# Patient Record
Sex: Male | Born: 1955 | Race: White | Hispanic: No | Marital: Married | Smoking: Never smoker
Health system: Southern US, Community
[De-identification: ages and names within clinical notes are randomized; demographics above are authoritative.]

## PROBLEM LIST (undated history)

## (undated) DIAGNOSIS — I10 Essential (primary) hypertension: Secondary | ICD-10-CM

## (undated) DIAGNOSIS — G510 Bell's palsy: Secondary | ICD-10-CM

## (undated) DIAGNOSIS — I4891 Unspecified atrial fibrillation: Secondary | ICD-10-CM

## (undated) DIAGNOSIS — I359 Nonrheumatic aortic valve disorder, unspecified: Secondary | ICD-10-CM

## (undated) DIAGNOSIS — E785 Hyperlipidemia, unspecified: Secondary | ICD-10-CM

## (undated) DIAGNOSIS — C6290 Malignant neoplasm of unspecified testis, unspecified whether descended or undescended: Secondary | ICD-10-CM

## (undated) DIAGNOSIS — I5021 Acute systolic (congestive) heart failure: Secondary | ICD-10-CM

## (undated) DIAGNOSIS — I499 Cardiac arrhythmia, unspecified: Secondary | ICD-10-CM

## (undated) DIAGNOSIS — M1711 Unilateral primary osteoarthritis, right knee: Secondary | ICD-10-CM

## (undated) DIAGNOSIS — R0601 Orthopnea: Secondary | ICD-10-CM

## (undated) DIAGNOSIS — K649 Unspecified hemorrhoids: Secondary | ICD-10-CM

## (undated) DIAGNOSIS — R011 Cardiac murmur, unspecified: Secondary | ICD-10-CM

## (undated) DIAGNOSIS — R0602 Shortness of breath: Secondary | ICD-10-CM

## (undated) HISTORY — PX: CARDIAC CATHETERIZATION: SHX172

## (undated) HISTORY — PX: CARDIAC VALVE REPLACEMENT: SHX585

---

## 1986-02-07 HISTORY — PX: KNEE ARTHROSCOPY W/ MENISCAL REPAIR: SHX1877

## 1991-02-08 HISTORY — PX: ORCHIECTOMY: SHX2116

## 1992-02-08 DIAGNOSIS — C6290 Malignant neoplasm of unspecified testis, unspecified whether descended or undescended: Secondary | ICD-10-CM

## 1992-02-08 HISTORY — DX: Malignant neoplasm of unspecified testis, unspecified whether descended or undescended: C62.90

## 1997-08-02 ENCOUNTER — Emergency Department (HOSPITAL_COMMUNITY): Admission: EM | Admit: 1997-08-02 | Discharge: 1997-08-02 | Payer: Self-pay | Admitting: Emergency Medicine

## 2009-09-07 DIAGNOSIS — G510 Bell's palsy: Secondary | ICD-10-CM

## 2009-09-07 HISTORY — DX: Bell's palsy: G51.0

## 2012-07-11 ENCOUNTER — Other Ambulatory Visit: Payer: Self-pay | Admitting: Interventional Cardiology

## 2012-07-13 ENCOUNTER — Encounter (HOSPITAL_COMMUNITY): Payer: Self-pay | Admitting: Anesthesiology

## 2012-07-13 ENCOUNTER — Ambulatory Visit (HOSPITAL_COMMUNITY)
Admission: RE | Admit: 2012-07-13 | Discharge: 2012-07-13 | Disposition: A | Discharge status: Home / Self Care | Payer: BC Managed Care – PPO | Source: Ambulatory Visit | Attending: Interventional Cardiology | Admitting: Interventional Cardiology

## 2012-07-13 ENCOUNTER — Encounter (HOSPITAL_COMMUNITY)
Admission: RE | Disposition: A | Discharge status: Home / Self Care | Payer: Self-pay | Source: Ambulatory Visit | Attending: Interventional Cardiology

## 2012-07-13 ENCOUNTER — Ambulatory Visit (HOSPITAL_COMMUNITY): Payer: BC Managed Care – PPO | Admitting: Anesthesiology

## 2012-07-13 ENCOUNTER — Encounter (HOSPITAL_COMMUNITY): Payer: Self-pay | Admitting: *Deleted

## 2012-07-13 DIAGNOSIS — I080 Rheumatic disorders of both mitral and aortic valves: Secondary | ICD-10-CM | POA: Insufficient documentation

## 2012-07-13 DIAGNOSIS — Z7901 Long term (current) use of anticoagulants: Secondary | ICD-10-CM | POA: Insufficient documentation

## 2012-07-13 DIAGNOSIS — I4891 Unspecified atrial fibrillation: Secondary | ICD-10-CM | POA: Insufficient documentation

## 2012-07-13 DIAGNOSIS — I359 Nonrheumatic aortic valve disorder, unspecified: Secondary | ICD-10-CM

## 2012-07-13 DIAGNOSIS — E8881 Metabolic syndrome: Secondary | ICD-10-CM | POA: Insufficient documentation

## 2012-07-13 DIAGNOSIS — Z79899 Other long term (current) drug therapy: Secondary | ICD-10-CM | POA: Insufficient documentation

## 2012-07-13 DIAGNOSIS — I10 Essential (primary) hypertension: Secondary | ICD-10-CM | POA: Insufficient documentation

## 2012-07-13 DIAGNOSIS — I079 Rheumatic tricuspid valve disease, unspecified: Secondary | ICD-10-CM | POA: Insufficient documentation

## 2012-07-13 DIAGNOSIS — E785 Hyperlipidemia, unspecified: Secondary | ICD-10-CM | POA: Insufficient documentation

## 2012-07-13 DIAGNOSIS — I517 Cardiomegaly: Secondary | ICD-10-CM | POA: Insufficient documentation

## 2012-07-13 DIAGNOSIS — Z8547 Personal history of malignant neoplasm of testis: Secondary | ICD-10-CM | POA: Insufficient documentation

## 2012-07-13 DIAGNOSIS — Q231 Congenital insufficiency of aortic valve: Secondary | ICD-10-CM | POA: Insufficient documentation

## 2012-07-13 DIAGNOSIS — Z9079 Acquired absence of other genital organ(s): Secondary | ICD-10-CM | POA: Insufficient documentation

## 2012-07-13 DIAGNOSIS — Z8249 Family history of ischemic heart disease and other diseases of the circulatory system: Secondary | ICD-10-CM | POA: Insufficient documentation

## 2012-07-13 HISTORY — DX: Bell's palsy: G51.0

## 2012-07-13 HISTORY — PX: CARDIOVERSION: SHX1299

## 2012-07-13 HISTORY — DX: Cardiac murmur, unspecified: R01.1

## 2012-07-13 HISTORY — DX: Hyperlipidemia, unspecified: E78.5

## 2012-07-13 HISTORY — DX: Unspecified atrial fibrillation: I48.91

## 2012-07-13 HISTORY — DX: Nonrheumatic aortic valve disorder, unspecified: I35.9

## 2012-07-13 HISTORY — DX: Unspecified hemorrhoids: K64.9

## 2012-07-13 HISTORY — PX: TEE WITHOUT CARDIOVERSION: SHX5443

## 2012-07-13 HISTORY — DX: Unilateral primary osteoarthritis, right knee: M17.11

## 2012-07-13 HISTORY — DX: Cardiac arrhythmia, unspecified: I49.9

## 2012-07-13 HISTORY — DX: Essential (primary) hypertension: I10

## 2012-07-13 SURGERY — ECHOCARDIOGRAM, TRANSESOPHAGEAL
Anesthesia: General

## 2012-07-13 MED ORDER — PROPOFOL 10 MG/ML IV BOLUS
INTRAVENOUS | Status: DC | PRN
  Administered 2012-07-13: 45 mg via INTRAVENOUS

## 2012-07-13 MED ORDER — FUROSEMIDE 40 MG PO TABS: 40.0000 mg | ORAL_TABLET | Freq: Two times a day (BID) | ORAL | Status: DC

## 2012-07-13 MED ORDER — AMIODARONE HCL 200 MG PO TABS: 400.0000 mg | ORAL_TABLET | Freq: Every day | ORAL | Status: DC

## 2012-07-13 MED ORDER — AMIODARONE HCL 400 MG PO TABS: 400.0000 mg | ORAL_TABLET | Freq: Every day | ORAL | Status: DC

## 2012-07-13 MED ORDER — LIDOCAINE VISCOUS 2 % MT SOLN
OROMUCOSAL | Status: DC | PRN
  Administered 2012-07-13: 10 mL via OROMUCOSAL

## 2012-07-13 MED ORDER — DILTIAZEM HCL ER BEADS 180 MG PO CP24: 360.0000 mg | ORAL_CAPSULE | Freq: Every day | ORAL | Status: DC

## 2012-07-13 MED ORDER — LIDOCAINE VISCOUS 2 % MT SOLN
OROMUCOSAL | Status: AC
  Filled 2012-07-13: qty 15

## 2012-07-13 MED ORDER — MIDAZOLAM HCL 10 MG/2ML IJ SOLN
INTRAMUSCULAR | Status: DC | PRN
  Administered 2012-07-13: 1 mg via INTRAVENOUS
  Administered 2012-07-13: 2 mg via INTRAVENOUS
  Administered 2012-07-13: 1 mg via INTRAVENOUS
  Administered 2012-07-13: 2 mg via INTRAVENOUS

## 2012-07-13 MED ORDER — MIDAZOLAM HCL 5 MG/ML IJ SOLN
INTRAMUSCULAR | Status: AC
  Filled 2012-07-13: qty 2

## 2012-07-13 MED ORDER — FENTANYL CITRATE 0.05 MG/ML IJ SOLN
INTRAMUSCULAR | Status: DC | PRN
  Administered 2012-07-13 (×2): 25 ug via INTRAVENOUS

## 2012-07-13 MED ORDER — SODIUM CHLORIDE 0.9 % IV SOLN
INTRAVENOUS | Status: DC | PRN
  Administered 2012-07-13: 16:00:00 via INTRAVENOUS

## 2012-07-13 MED ORDER — SODIUM CHLORIDE 0.9 % IV SOLN
INTRAVENOUS | Status: DC
Start: 2012-07-13 — End: 2012-07-13
  Administered 2012-07-13: 14:00:00 via INTRAVENOUS

## 2012-07-13 MED ORDER — FENTANYL CITRATE 0.05 MG/ML IJ SOLN
INTRAMUSCULAR | Status: AC
  Filled 2012-07-13: qty 4

## 2012-07-13 NOTE — Progress Notes (Signed)
  Echocardiogram Echocardiogram Transesophageal has been performed.  Georgian Co 07/13/2012, 4:03 PM

## 2012-07-13 NOTE — Transfer of Care (Signed)
Immediate Anesthesia Transfer of Care Note  Patient: Reginald Myers  Procedure(s) Performed: Procedure(s): TRANSESOPHAGEAL ECHOCARDIOGRAM (TEE) (N/A) CARDIOVERSION (N/A)  Patient Location: PACU and Endoscopy Unit  Anesthesia Type:General  Level of Consciousness: sedated and patient cooperative  Airway & Oxygen Therapy: Patient Spontanous Breathing and Patient connected to nasal cannula oxygen  Post-op Assessment: Report given to PACU RN and Post -op Vital signs reviewed and stable  Post vital signs: Reviewed and stable  Complications: No apparent anesthesia complications

## 2012-07-13 NOTE — Anesthesia Preprocedure Evaluation (Signed)
Anesthesia Evaluation  Patient identified by MRN, date of birth, ID band Patient awake    Reviewed: Allergy & Precautions, H&P , NPO status , Patient's Chart, lab work & pertinent test results  Airway  TM Distance: >3 FB Neck ROM: full    Dental   Pulmonary          Cardiovascular hypertension, + dysrhythmias Atrial Fibrillation Rhythm:irregular Rate:Normal     Neuro/Psych    GI/Hepatic   Endo/Other    Renal/GU      Musculoskeletal   Abdominal   Peds  Hematology   Anesthesia Other Findings   Reproductive/Obstetrics                           Anesthesia Physical Anesthesia Plan  ASA: III  Anesthesia Plan: General   Post-op Pain Management:    Induction: Intravenous  Airway Management Planned: Mask  Additional Equipment:   Intra-op Plan:   Post-operative Plan:   Informed Consent: I have reviewed the patients History and Physical, chart, labs and discussed the procedure including the risks, benefits and alternatives for the proposed anesthesia with the patient or authorized representative who has indicated his/her understanding and acceptance.     Plan Discussed with: CRNA, Anesthesiologist and Surgeon  Anesthesia Plan Comments:         Anesthesia Quick Evaluation

## 2012-07-13 NOTE — H&P (Signed)
  Date of Initial H&P: 07/06/12  History reviewed, patient examined, no change in status, stable for TEE/CV for AFib with RVR.  WIll evaluate the aortic valve as well.

## 2012-07-13 NOTE — Preoperative (Signed)
Beta Blockers   Reason not to administer Beta Blockers:Not Applicable 

## 2012-07-13 NOTE — Anesthesia Postprocedure Evaluation (Signed)
  Anesthesia Post-op Note  Patient: Reginald Myers  Procedure(s) Performed: Procedure(s): TRANSESOPHAGEAL ECHOCARDIOGRAM (TEE) (N/A) CARDIOVERSION (N/A)  Patient Location: PACU and Endoscopy Unit  Anesthesia Type:General  Level of Consciousness: awake, oriented and patient cooperative  Airway and Oxygen Therapy: Patient Spontanous Breathing  Post-op Pain: none  Post-op Assessment: Post-op Vital signs reviewed, Patient's Cardiovascular Status Stable, Respiratory Function Stable, Patent Airway, No signs of Nausea or vomiting and Pain level controlled  Post-op Vital Signs: stable  Complications: No apparent anesthesia complications

## 2012-07-13 NOTE — CV Procedure (Signed)
Electrical Cardioversion Procedure Note Reginald Myers [id number] 1955/11/06  Procedure: Electrical Cardioversion Indications:  Atrial Fibrillation  Time Out: Verified patient identification, verified procedure,medications/allergies/relevent history reviewed, required imaging and test results available.  Performed  Procedure Details  The patient was NPO after midnight. Anesthesia was administered at the beside  by Dr. Katrinka Blazing with 45 mg of propofol.  Cardioversion was done with synchronized biphasic defibrillation with AP pads with 120J, 150J, 200J.  The patient converted to normal sinus rhythm each time but quickly reverted to AFib with RVR. The patient tolerated the procedure well.  IMPRESSION:  Cardioversion attempt of atrial fibrillation with restoration of NSR, that lasted only 45 seconds, before recurrence of AFib.    Reginald Myers S. 07/13/2012, 4:05 PM

## 2012-07-15 ENCOUNTER — Encounter (HOSPITAL_COMMUNITY): Payer: Self-pay | Admitting: Interventional Cardiology

## 2012-08-06 ENCOUNTER — Other Ambulatory Visit: Payer: Self-pay | Admitting: Interventional Cardiology

## 2012-08-08 ENCOUNTER — Ambulatory Visit (HOSPITAL_COMMUNITY)
Admission: RE | Admit: 2012-08-08 | Discharge: 2012-08-08 | Disposition: A | Discharge status: Home / Self Care | Payer: BC Managed Care – PPO | Source: Ambulatory Visit | Attending: Interventional Cardiology | Admitting: Interventional Cardiology

## 2012-08-08 ENCOUNTER — Encounter (HOSPITAL_COMMUNITY)
Admission: RE | Disposition: A | Discharge status: Home / Self Care | Payer: Self-pay | Source: Ambulatory Visit | Attending: Interventional Cardiology

## 2012-08-08 ENCOUNTER — Encounter (HOSPITAL_COMMUNITY): Payer: Self-pay

## 2012-08-08 ENCOUNTER — Ambulatory Visit (HOSPITAL_COMMUNITY): Payer: BC Managed Care – PPO | Admitting: Anesthesiology

## 2012-08-08 ENCOUNTER — Encounter (HOSPITAL_COMMUNITY): Payer: Self-pay | Admitting: Anesthesiology

## 2012-08-08 DIAGNOSIS — E785 Hyperlipidemia, unspecified: Secondary | ICD-10-CM | POA: Insufficient documentation

## 2012-08-08 DIAGNOSIS — Z7901 Long term (current) use of anticoagulants: Secondary | ICD-10-CM | POA: Insufficient documentation

## 2012-08-08 DIAGNOSIS — E8881 Metabolic syndrome: Secondary | ICD-10-CM | POA: Insufficient documentation

## 2012-08-08 DIAGNOSIS — I10 Essential (primary) hypertension: Secondary | ICD-10-CM | POA: Insufficient documentation

## 2012-08-08 DIAGNOSIS — I4891 Unspecified atrial fibrillation: Secondary | ICD-10-CM | POA: Insufficient documentation

## 2012-08-08 DIAGNOSIS — R011 Cardiac murmur, unspecified: Secondary | ICD-10-CM | POA: Insufficient documentation

## 2012-08-08 DIAGNOSIS — I359 Nonrheumatic aortic valve disorder, unspecified: Secondary | ICD-10-CM | POA: Insufficient documentation

## 2012-08-08 DIAGNOSIS — Z79899 Other long term (current) drug therapy: Secondary | ICD-10-CM | POA: Insufficient documentation

## 2012-08-08 HISTORY — PX: CARDIOVERSION: SHX1299

## 2012-08-08 SURGERY — CARDIOVERSION
Anesthesia: General

## 2012-08-08 MED ORDER — PROPOFOL 10 MG/ML IV BOLUS
INTRAVENOUS | Status: DC | PRN
  Administered 2012-08-08: 50 mg via INTRAVENOUS
  Administered 2012-08-08: 20 mg via INTRAVENOUS
  Administered 2012-08-08: 50 mg via INTRAVENOUS

## 2012-08-08 MED ORDER — LIDOCAINE HCL (CARDIAC) 20 MG/ML IV SOLN
INTRAVENOUS | Status: DC | PRN
  Administered 2012-08-08: 20 mg via INTRAVENOUS

## 2012-08-08 NOTE — H&P (Signed)
Date of Initial H&P: 07/19/12  History reviewed, patient examined, no change in status, stable for cardioversion.

## 2012-08-08 NOTE — Transfer of Care (Signed)
Immediate Anesthesia Transfer of Care Note  Patient: Reginald Myers  Procedure(s) Performed: Procedure(s): CARDIOVERSION (N/A)  Patient Location: Endoscopy Unit  Anesthesia Type:General  Level of Consciousness: awake, alert  and oriented  Airway & Oxygen Therapy: Patient Spontanous Breathing and Patient connected to nasal cannula oxygen  Post-op Assessment: Report given to PACU RN and Post -op Vital signs reviewed and stable  Post vital signs: Reviewed and stable  Complications: No apparent anesthesia complications

## 2012-08-08 NOTE — Anesthesia Postprocedure Evaluation (Signed)
  Anesthesia Post-op Note  Patient: Reginald Myers  Procedure(s) Performed: Procedure(s): CARDIOVERSION (N/A)  Patient Location: Endoscopy Unit  Anesthesia Type:General  Level of Consciousness: awake, alert  and oriented  Airway and Oxygen Therapy: Patient Spontanous Breathing and Patient connected to nasal cannula oxygen  Post-op Pain: none  Post-op Assessment: Post-op Vital signs reviewed, Patient's Cardiovascular Status Stable, Respiratory Function Stable, Patent Airway and No signs of Nausea or vomiting  Post-op Vital Signs: Reviewed and stable  Complications: No apparent anesthesia complications

## 2012-08-08 NOTE — CV Procedure (Addendum)
Electrical Cardioversion Procedure Note Author Hatlestad [id number] November 22, 1955  Procedure: Electrical Cardioversion Indications:  Atrial Fibrillation  Time Out: Verified patient identification, verified procedure,medications/allergies/relevent history reviewed, required imaging and test results available.  Performed  Procedure Details  The patient was NPO after midnight. Anesthesia was administered at the beside  by Dr.Jackson with 120mg  of propofol.  Cardioversion was done with synchronized biphasic defibrillation with AP pads up to 200J.  The patient did not convert to normal sinus rhythm. The patient tolerated the procedure well   IMPRESSION:  Unsuccessful cardioversion. Patient remains in atrial fibrillation.  Will have to consider referral for AVR with Maze procedure.    Reginald Myers S. 08/08/2012, 12:41 PM

## 2012-08-08 NOTE — Anesthesia Preprocedure Evaluation (Addendum)
Anesthesia Evaluation  Patient identified by MRN, date of birth, ID band Patient awake    Reviewed: Allergy & Precautions, H&P , NPO status , Patient's Chart, lab work & pertinent test results  History of Anesthesia Complications Negative for: history of anesthetic complications  Airway Mallampati: I TM Distance: >3 FB Neck ROM: Full    Dental  (+) Teeth Intact and Dental Advisory Given   Pulmonary neg pulmonary ROS,  breath sounds clear to auscultation  Pulmonary exam normal       Cardiovascular hypertension, Pt. on medications + dysrhythmias (xarelto) Atrial Fibrillation + Valvular Problems/Murmurs AI Rhythm:Irregular Rate:Normal  ECHO: EF 33%, Aortic insufficiency   Neuro/Psych negative neurological ROS     GI/Hepatic negative GI ROS, Neg liver ROS,   Endo/Other  negative endocrine ROS  Renal/GU negative Renal ROS   H/o testicular cancer    Musculoskeletal   Abdominal   Peds  Hematology   Anesthesia Other Findings   Reproductive/Obstetrics                         Anesthesia Physical Anesthesia Plan  ASA: III  Anesthesia Plan: General   Post-op Pain Management:    Induction: Intravenous  Airway Management Planned: Mask  Additional Equipment:   Intra-op Plan:   Post-operative Plan:   Informed Consent: I have reviewed the patients History and Physical, chart, labs and discussed the procedure including the risks, benefits and alternatives for the proposed anesthesia with the patient or authorized representative who has indicated his/her understanding and acceptance.   Dental advisory given  Plan Discussed with: CRNA and Surgeon  Anesthesia Plan Comments:         Anesthesia Quick Evaluation

## 2012-08-09 ENCOUNTER — Encounter (HOSPITAL_COMMUNITY): Payer: Self-pay | Admitting: Interventional Cardiology

## 2012-08-13 ENCOUNTER — Inpatient Hospital Stay (HOSPITAL_COMMUNITY)
Admission: AD | Admit: 2012-08-13 | Discharge: 2012-08-16 | DRG: 543 | Disposition: A | Discharge status: Home / Self Care | Payer: BC Managed Care – PPO | Source: Ambulatory Visit | Attending: Interventional Cardiology | Admitting: Interventional Cardiology

## 2012-08-13 ENCOUNTER — Encounter (HOSPITAL_COMMUNITY): Payer: Self-pay | Admitting: General Practice

## 2012-08-13 ENCOUNTER — Other Ambulatory Visit: Payer: Self-pay | Admitting: Interventional Cardiology

## 2012-08-13 DIAGNOSIS — I2789 Other specified pulmonary heart diseases: Secondary | ICD-10-CM | POA: Diagnosis present

## 2012-08-13 DIAGNOSIS — I5021 Acute systolic (congestive) heart failure: Secondary | ICD-10-CM | POA: Diagnosis present

## 2012-08-13 DIAGNOSIS — I080 Rheumatic disorders of both mitral and aortic valves: Principal | ICD-10-CM | POA: Diagnosis present

## 2012-08-13 DIAGNOSIS — M1710 Unilateral primary osteoarthritis, unspecified knee: Secondary | ICD-10-CM | POA: Diagnosis present

## 2012-08-13 DIAGNOSIS — E785 Hyperlipidemia, unspecified: Secondary | ICD-10-CM | POA: Diagnosis present

## 2012-08-13 DIAGNOSIS — R0602 Shortness of breath: Secondary | ICD-10-CM | POA: Insufficient documentation

## 2012-08-13 DIAGNOSIS — I079 Rheumatic tricuspid valve disease, unspecified: Secondary | ICD-10-CM | POA: Diagnosis present

## 2012-08-13 DIAGNOSIS — I428 Other cardiomyopathies: Secondary | ICD-10-CM | POA: Diagnosis present

## 2012-08-13 DIAGNOSIS — I2510 Atherosclerotic heart disease of native coronary artery without angina pectoris: Secondary | ICD-10-CM | POA: Diagnosis present

## 2012-08-13 DIAGNOSIS — I509 Heart failure, unspecified: Secondary | ICD-10-CM | POA: Diagnosis present

## 2012-08-13 DIAGNOSIS — I359 Nonrheumatic aortic valve disorder, unspecified: Secondary | ICD-10-CM

## 2012-08-13 DIAGNOSIS — I4891 Unspecified atrial fibrillation: Secondary | ICD-10-CM | POA: Diagnosis present

## 2012-08-13 DIAGNOSIS — I10 Essential (primary) hypertension: Secondary | ICD-10-CM | POA: Diagnosis present

## 2012-08-13 HISTORY — DX: Malignant neoplasm of unspecified testis, unspecified whether descended or undescended: C62.90

## 2012-08-13 HISTORY — DX: Shortness of breath: R06.02

## 2012-08-13 HISTORY — DX: Orthopnea: R06.01

## 2012-08-13 HISTORY — DX: Acute systolic (congestive) heart failure: I50.21

## 2012-08-13 LAB — CBC WITH DIFFERENTIAL/PLATELET
Eosinophils Absolute: 0.2 10*3/uL (ref 0.0–0.7)
Eosinophils Relative: 3 % (ref 0–5)
HCT: 42.6 % (ref 39.0–52.0)
Lymphocytes Relative: 13 % (ref 12–46)
Lymphs Abs: 1.1 10*3/uL (ref 0.7–4.0)
MCH: 27.3 pg (ref 26.0–34.0)
MCV: 81.9 fL (ref 78.0–100.0)
Monocytes Absolute: 0.5 10*3/uL (ref 0.1–1.0)
Platelets: 204 10*3/uL (ref 150–400)
RBC: 5.2 MIL/uL (ref 4.22–5.81)
RDW: 15.9 % — ABNORMAL HIGH (ref 11.5–15.5)
WBC: 8.4 10*3/uL (ref 4.0–10.5)

## 2012-08-13 LAB — TSH: TSH: 3.003 u[IU]/mL (ref 0.350–4.500)

## 2012-08-13 LAB — COMPREHENSIVE METABOLIC PANEL
CO2: 24 mEq/L (ref 19–32)
Calcium: 9 mg/dL (ref 8.4–10.5)
Creatinine, Ser: 1.79 mg/dL — ABNORMAL HIGH (ref 0.50–1.35)
GFR calc Af Amer: 48 mL/min — ABNORMAL LOW (ref >90)
GFR calc non Af Amer: 41 mL/min — ABNORMAL LOW (ref >90)
Glucose, Bld: 129 mg/dL — ABNORMAL HIGH (ref 70–99)
Sodium: 137 mEq/L (ref 135–145)
Total Protein: 6.9 g/dL (ref 6.0–8.3)

## 2012-08-13 MED ORDER — FUROSEMIDE 10 MG/ML IJ SOLN
80.0000 mg | Freq: Two times a day (BID) | INTRAMUSCULAR | Status: DC
  Administered 2012-08-13 – 2012-08-14 (×2): 80 mg via INTRAVENOUS
  Filled 2012-08-13 (×4): qty 8

## 2012-08-13 MED ORDER — DILTIAZEM HCL ER 180 MG PO CP24
180.0000 mg | ORAL_CAPSULE | Freq: Two times a day (BID) | ORAL | Status: DC
  Administered 2012-08-14 – 2012-08-16 (×4): 180 mg via ORAL
  Filled 2012-08-13 (×7): qty 1

## 2012-08-13 MED ORDER — ONDANSETRON HCL 4 MG/2ML IJ SOLN: 4.0000 mg | Freq: Four times a day (QID) | INTRAMUSCULAR | Status: DC | PRN

## 2012-08-13 MED ORDER — SODIUM CHLORIDE 0.9 % IJ SOLN
3.0000 mL | Freq: Two times a day (BID) | INTRAMUSCULAR | Status: DC
  Administered 2012-08-13 – 2012-08-16 (×6): 3 mL via INTRAVENOUS

## 2012-08-13 MED ORDER — SODIUM CHLORIDE 0.9 % IJ SOLN: 3.0000 mL | INTRAMUSCULAR | Status: DC | PRN

## 2012-08-13 MED ORDER — HEPARIN SODIUM (PORCINE) 5000 UNIT/ML IJ SOLN
5000.0000 [IU] | Freq: Three times a day (TID) | INTRAMUSCULAR | Status: DC
  Administered 2012-08-13 – 2012-08-16 (×8): 5000 [IU] via SUBCUTANEOUS
  Filled 2012-08-13 (×12): qty 1

## 2012-08-13 MED ORDER — DIGOXIN 250 MCG PO TABS
0.2500 mg | ORAL_TABLET | Freq: Every day | ORAL | Status: AC
  Administered 2012-08-13 – 2012-08-14 (×2): 0.25 mg via ORAL
  Filled 2012-08-13 (×4): qty 1

## 2012-08-13 MED ORDER — SODIUM CHLORIDE 0.9 % IV SOLN: 250.0000 mL | INTRAVENOUS | Status: DC | PRN

## 2012-08-13 MED ORDER — ACETAMINOPHEN 325 MG PO TABS: 650.0000 mg | ORAL_TABLET | ORAL | Status: DC | PRN

## 2012-08-13 NOTE — H&P (Signed)
Admit date: 08/13/12 Referring Physician Dr. Kevan Ny Primary Cardiologist:Varanasi Chief complaint/reason for admission:Shortness of breath, fluid overload  HPI: 57 -year-old man who had moderate to severe aortic insufficiency diagnosed back in December of 2013, after his primary care physician heard a heart murmur. He had normal left ventricular function at that time. He followed up with me in about 5 months later. He had not been feeling well for about a month. He was tired and could not keep up with his usual exercise regimen. On exam, he was tachycardic at the time. He was found to be in atrial fibrillation with rapid ventricular response. Echocardiogram in May of 2014 showed significantly decreased left ventricular function. We attempted rate control but he continued to feel fatigued. We attempted cardioversion with the thought that his LV function would improve with a decrease in heart rate. Unfortunately, the cardioversion was unsuccessful as he would convert to normal rhythm but would not maintain sinus rhythm. We then started him on amiodarone in the hopes of her repeat cardioversion. At this point, it was obvious that he would need his aortic valve repair as this was likely the cause of his LV dysfunction. The goal is to have his LV function come back to normal and then send him for surgery. He did not tolerate amiodarone very well. He felt nauseated and his dose was lowered. He underwent repeat cardioversion and unfortunately, this failed to get him back into normal sinus rhythm. Since this attempt last week, he was started on metoprolol. He felt very poorly after the first dose of metoprolol. She has gained about 18 pounds over the course of the last few weeks. He is worsening swelling in his legs and orthopnea. He also has a cough.     PMH:    Past Medical History  Diagnosis Date  . Heart murmur   . Dysrhythmia   . 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 Hypertension   . Hyperlipidemia   . Right  knee DJD     prior meniscal surgery  . Bell's palsy 09/2009    right-residual weakness with fatique   . Hemorrhoids   . Right knee pain   . Atrial fibrillation   . Aortic valve disorders     PSH:    Past Surgical History  Procedure Laterality Date  . Orchiectomy Left 1993  . Knee surgery Right 1988    meniscal repair  . Tee without cardioversion N/A 07/13/2012    Procedure: TRANSESOPHAGEAL ECHOCARDIOGRAM (TEE);  Surgeon: Corky Crafts, MD;  Location: [facility] ENDOSCOPY;  Service: Cardiovascular;  Laterality: N/A;  . Cardioversion N/A 07/13/2012    Procedure: CARDIOVERSION;  Surgeon: Corky Crafts, MD;  Location: [facility] ENDOSCOPY;  Service: Cardiovascular;  Laterality: N/A;  . Cardioversion N/A 08/08/2012    Procedure: CARDIOVERSION;  Surgeon: Corky Crafts, MD;  Location: [facility] ENDOSCOPY;  Service: Cardiovascular;  Laterality: N/A;    ALLERGIES:   Review of patient's allergies indicates no known allergies.  Prior to Admit Meds:   (Not in a hospital admission) Family HX:   No family history on file. Social HX:    History   Social History  . Marital Status: Single    Spouse Name: N/A    Number of Children: N/A  . Years of Education: N/A   Occupational History  . Not on file.   Social History Main Topics  . Smoking status: Never Smoker   . Smokeless tobacco: Never Used  . Alcohol Use: Yes  Comment: glass of wine couple times a month  . Drug Use: No  . Sexually Active: Not on file   Other Topics Concern  . Not on file   Social History Narrative  . No narrative on file     ROS:  All 11 ROS were addressed and are negative except what is stated in the HPI  PHYSICAL EXAM There were no vitals filed for this visit. General: Well developed, well nourished, in no acute distress Head:   Normal cephalic and atramatic  Lungs: bibasilar crackles Heart:  Irregularly irregular Abdomen:  abdomen soft and non-tender Msk:   Normal strength and tone for age. Extremities:   Bilateral pitting edema. Neuro: Alert and oriented X 3. Psych:  Normal affect, responds appropriately   Labs:   No results found for this basename: WBC, HGB, HCT, MCV, PLT   No results found for this basename: NA, K, CL, CO2, BUN, CREATININE, CALCIUM, LABALBU, PROT, BILITOT, ALKPHOS, ALT, AST, GLUCOSE,  in the last 168 hours No results found for this basename: CKTOTAL, CKMB, CKMBINDEX, TROPONINI   No results found for this basename: PTT   No results found for this basename: INR, PROTIME     No results found for this basename: CHOL   No results found for this basename: HDL   No results found for this basename: LDLCALC   No results found for this basename: TRIG   No results found for this basename: CHOLHDL   No results found for this basename: LDLDIRECT      Radiology:  @[id number] @  EKG:  pending  ASSESSMENT: Aortic insufficiency, SHOB, edema, AFib  PLAN:  SOB Notes: I think is related to fluid overload. We discussed admitting him to the hospital. He is willing to do this. We'll give him IV Lasix. we'll have to watch his renal function closely as this has been mildly elevated. Aortic heart murmur Notes: significant aortic insufficiency, when he is stabilized, he will need aortic valve replacement. He also had some mitral regurgitation noted on TEE. It was difficult to quantitate due to the fact that he was in atrial fibrillation with rapid ventricular response.  He will need cath preop and surgical consultation while in the hospital. Atrial fibrillation Notes: Rate control with Cardizem. Will add digoxin for additional rate control. He did not tolerate amiodarone. Edema of legs Notes: Hopefully, this will improve with IV Lasix.    Corky Crafts., MD  08/13/2012  2:49 PM

## 2012-08-13 NOTE — Progress Notes (Signed)
Utilization Review Completed.   Daana Petrasek, RN, BSN Nurse Case Manager  [phone]  

## 2012-08-14 ENCOUNTER — Other Ambulatory Visit: Payer: Self-pay | Admitting: *Deleted

## 2012-08-14 DIAGNOSIS — I359 Nonrheumatic aortic valve disorder, unspecified: Secondary | ICD-10-CM

## 2012-08-14 LAB — BASIC METABOLIC PANEL
BUN: 30 mg/dL — ABNORMAL HIGH (ref 6–23)
Chloride: 102 mEq/L (ref 96–112)
GFR calc Af Amer: 46 mL/min — ABNORMAL LOW (ref >90)
GFR calc non Af Amer: 39 mL/min — ABNORMAL LOW (ref >90)
Potassium: 3.5 mEq/L (ref 3.5–5.1)
Sodium: 139 mEq/L (ref 135–145)

## 2012-08-14 MED ORDER — SODIUM CHLORIDE 0.9 % IJ SOLN
3.0000 mL | Freq: Two times a day (BID) | INTRAMUSCULAR | Status: DC
  Administered 2012-08-15: 3 mL via INTRAVENOUS

## 2012-08-14 MED ORDER — FUROSEMIDE 10 MG/ML IJ SOLN
40.0000 mg | Freq: Two times a day (BID) | INTRAMUSCULAR | Status: DC
  Administered 2012-08-14: 40 mg via INTRAVENOUS

## 2012-08-14 MED ORDER — POTASSIUM CHLORIDE CRYS ER 20 MEQ PO TBCR
40.0000 meq | EXTENDED_RELEASE_TABLET | Freq: Once | ORAL | Status: AC
  Administered 2012-08-14: 40 meq via ORAL
  Filled 2012-08-14: qty 2

## 2012-08-14 MED ORDER — SODIUM CHLORIDE 0.9 % IV SOLN
INTRAVENOUS | Status: DC
  Administered 2012-08-15: 20 mL/h via INTRAVENOUS

## 2012-08-14 MED ORDER — ASPIRIN 81 MG PO CHEW
324.0000 mg | CHEWABLE_TABLET | ORAL | Status: AC
  Administered 2012-08-15: 324 mg via ORAL
  Filled 2012-08-14: qty 4

## 2012-08-14 MED ORDER — DIAZEPAM 5 MG PO TABS
5.0000 mg | ORAL_TABLET | ORAL | Status: AC
  Administered 2012-08-15: 5 mg via ORAL
  Filled 2012-08-14: qty 1

## 2012-08-14 MED ORDER — SODIUM CHLORIDE 0.9 % IJ SOLN: 3.0000 mL | INTRAMUSCULAR | Status: DC | PRN

## 2012-08-14 MED ORDER — ACETAMINOPHEN 325 MG PO TABS: 650.0000 mg | ORAL_TABLET | ORAL | Status: DC | PRN

## 2012-08-14 MED ORDER — ONDANSETRON HCL 4 MG/2ML IJ SOLN: 4.0000 mg | Freq: Four times a day (QID) | INTRAMUSCULAR | Status: DC | PRN

## 2012-08-14 MED ORDER — SODIUM CHLORIDE 0.9 % IV SOLN: 250.0000 mL | INTRAVENOUS | Status: DC | PRN

## 2012-08-14 NOTE — Progress Notes (Signed)
  Echocardiogram 2D Echocardiogram has been performed.  Reginald Myers FRANCES 08/14/2012, 10:35 AM

## 2012-08-14 NOTE — Progress Notes (Signed)
SUBJECTIVE:   Feels better this morning.  He diuresed over 3 kg  In the past 18 hours.  He is able to lie flat.  OBJECTIVE:   Vitals:   Filed Vitals:   08/13/12 1821 08/13/12 2136 08/14/12 0117 08/14/12 0611  BP:  114/84 90/41 89/42   Pulse: 100 103 95 96  Temp:  97.2 F (36.2 C)  98 F (36.7 C)  TempSrc:  Axillary  Oral  Resp:  18 18 18   Height:      Weight:    97.977 kg (216 lb)  SpO2:  100% 98% 93%   I&O's:   Intake/Output Summary (Last 24 hours) at 08/14/12 1610 Last data filed at 08/14/12 0847  Gross per 24 hour  Intake    843 ml  Output   4000 ml  Net  -3157 ml   TELEMETRY: Reviewed telemetry pt in atrial fibrillation, borderline rate control:     PHYSICAL EXAM General: Well developed, well nourished, in no acute distress Head:    Normal cephalic and atramatic  Lungs:  Scant bibasilar crackles Heart:  Irregularly irregular, normal rate Abdomen:  abdomen soft and non-tender  Msk:  Back normal,Normal strength and tone for age. Extremities:  Bilateral pitting lower extremity edema.   Neuro: Alert and oriented X 3. Psych:  Normal affect, responds appropriately   LABS: Basic Metabolic Panel:  Recent Labs  96/04/54 1805 08/14/12 0400  NA 137 139  K 4.0 3.5  CL 99 102  CO2 24 26  GLUCOSE 129* 83  BUN 32* 30*  CREATININE 1.79* 1.86*  CALCIUM 9.0 8.8   Liver Function Tests:  Recent Labs  08/13/12 1805  AST 31  ALT 29  ALKPHOS 90  BILITOT 1.7*  PROT 6.9  ALBUMIN 3.4*   No results found for this basename: LIPASE, AMYLASE,  in the last 72 hours CBC:  Recent Labs  08/13/12 1805  WBC 8.4  NEUTROABS 6.6  HGB 14.2  HCT 42.6  MCV 81.9  PLT 204   Cardiac Enzymes: No results found for this basename: CKTOTAL, CKMB, CKMBINDEX, TROPONINI,  in the last 72 hours BNP: No components found with this basename: POCBNP,  D-Dimer: No results found for this basename: DDIMER,  in the last 72 hours Hemoglobin A1C: No results found for this basename:  HGBA1C,  in the last 72 hours Fasting Lipid Panel: No results found for this basename: CHOL, HDL, LDLCALC, TRIG, CHOLHDL, LDLDIRECT,  in the last 72 hours Thyroid Function Tests:  Recent Labs  08/13/12 1805  TSH 3.003   Anemia Panel: No results found for this basename: VITAMINB12, FOLATE, FERRITIN, TIBC, IRON, RETICCTPCT,  in the last 72 hours Coag Panel:   No results found for this basename: INR, PROTIME    RADIOLOGY: No results found.    ASSESSMENT: Severe aortic insufficiency, cardiomyopathy, atrial fibrillation, fluid overload  PLAN:  Continue diuresis today.  We'll decrease dose of IV Lasix to 40 mg IV twice a day.  We'll have to follow renal function given the aggressive diuresis.  Replace potassium today.  No ACE inhibitor given renal insufficiency.  Continue calcium channel blocker for rate control.  He is currently off of his anticoagulation in anticipation of cardiac cath tomorrow.  I have consulted cardiac surgery regarding aortic valve replacement.  After cath, would plan on resuming Xarelto.  Digoxin started as well for additional help with rate control.  He was intolerant of amiodarone and metoprolol. He has fail 2 cardioversions as well.  Corky Crafts., MD  08/14/2012  9:37 AM

## 2012-08-15 ENCOUNTER — Encounter (HOSPITAL_COMMUNITY)
Admission: AD | Disposition: A | Discharge status: Home / Self Care | Payer: Self-pay | Source: Ambulatory Visit | Attending: Interventional Cardiology

## 2012-08-15 ENCOUNTER — Inpatient Hospital Stay (HOSPITAL_COMMUNITY): Payer: BC Managed Care – PPO

## 2012-08-15 HISTORY — PX: LEFT AND RIGHT HEART CATHETERIZATION WITH CORONARY ANGIOGRAM: SHX5449

## 2012-08-15 LAB — BASIC METABOLIC PANEL
CO2: 27 mEq/L (ref 19–32)
Chloride: 103 mEq/L (ref 96–112)
Creatinine, Ser: 1.81 mg/dL — ABNORMAL HIGH (ref 0.50–1.35)
GFR calc Af Amer: 47 mL/min — ABNORMAL LOW (ref >90)
Potassium: 3.7 mEq/L (ref 3.5–5.1)
Sodium: 139 mEq/L (ref 135–145)

## 2012-08-15 LAB — CBC
HCT: 40.5 % (ref 39.0–52.0)
MCHC: 32.3 g/dL (ref 30.0–36.0)
MCV: 82.5 fL (ref 78.0–100.0)
Platelets: 193 10*3/uL (ref 150–400)
RDW: 15.9 % — ABNORMAL HIGH (ref 11.5–15.5)

## 2012-08-15 LAB — POCT I-STAT 3, ART BLOOD GAS (G3+)
Acid-Base Excess: 1 mmol/L (ref 0.0–2.0)
Acid-base deficit: 1 mmol/L (ref 0.0–2.0)
Bicarbonate: 24.3 mEq/L — ABNORMAL HIGH (ref 20.0–24.0)
O2 Saturation: 79 %
TCO2: 25 mmol/L (ref 0–100)

## 2012-08-15 LAB — POCT I-STAT 3, VENOUS BLOOD GAS (G3P V)
Acid-Base Excess: 1 mmol/L (ref 0.0–2.0)
pCO2, Ven: 39.5 mmHg — ABNORMAL LOW (ref 45.0–50.0)
pH, Ven: 7.421 — ABNORMAL HIGH (ref 7.250–7.300)
pO2, Ven: 31 mmHg (ref 30.0–45.0)

## 2012-08-15 LAB — POCT ACTIVATED CLOTTING TIME: Activated Clotting Time: 170 seconds

## 2012-08-15 SURGERY — LEFT AND RIGHT HEART CATHETERIZATION WITH CORONARY ANGIOGRAM
Anesthesia: Moderate Sedation

## 2012-08-15 SURGERY — LEFT AND RIGHT HEART CATHETERIZATION WITH CORONARY ANGIOGRAM
Anesthesia: LOCAL

## 2012-08-15 MED ORDER — HEPARIN (PORCINE) IN NACL 2-0.9 UNIT/ML-% IJ SOLN
INTRAMUSCULAR | Status: AC
  Filled 2012-08-15: qty 1000

## 2012-08-15 MED ORDER — MIDAZOLAM HCL 2 MG/2ML IJ SOLN
INTRAMUSCULAR | Status: AC
  Filled 2012-08-15: qty 2

## 2012-08-15 MED ORDER — ONDANSETRON HCL 4 MG/2ML IJ SOLN: 4.0000 mg | Freq: Four times a day (QID) | INTRAMUSCULAR | Status: DC | PRN

## 2012-08-15 MED ORDER — HEPARIN SODIUM (PORCINE) 5000 UNIT/ML IJ SOLN: 5000.0000 [IU] | Freq: Three times a day (TID) | INTRAMUSCULAR | Status: DC

## 2012-08-15 MED ORDER — FENTANYL CITRATE 0.05 MG/ML IJ SOLN
INTRAMUSCULAR | Status: AC
  Filled 2012-08-15: qty 2

## 2012-08-15 MED ORDER — ACETAMINOPHEN 325 MG PO TABS: 650.0000 mg | ORAL_TABLET | ORAL | Status: DC | PRN

## 2012-08-15 MED ORDER — MORPHINE SULFATE 2 MG/ML IJ SOLN: 1.0000 mg | INTRAMUSCULAR | Status: DC | PRN

## 2012-08-15 MED ORDER — NITROGLYCERIN 0.2 MG/ML ON CALL CATH LAB
INTRAVENOUS | Status: AC
  Filled 2012-08-15: qty 1

## 2012-08-15 MED ORDER — HEPARIN SODIUM (PORCINE) 1000 UNIT/ML IJ SOLN
INTRAMUSCULAR | Status: AC
  Filled 2012-08-15: qty 1

## 2012-08-15 MED ORDER — LIDOCAINE HCL (PF) 1 % IJ SOLN
INTRAMUSCULAR | Status: AC
  Filled 2012-08-15: qty 30

## 2012-08-15 MED ORDER — ALBUTEROL SULFATE (5 MG/ML) 0.5% IN NEBU
2.5000 mg | INHALATION_SOLUTION | Freq: Once | RESPIRATORY_TRACT | Status: AC
  Administered 2012-08-15: 2.5 mg via RESPIRATORY_TRACT

## 2012-08-15 MED ORDER — VERAPAMIL HCL 2.5 MG/ML IV SOLN
INTRAVENOUS | Status: AC
  Filled 2012-08-15: qty 2

## 2012-08-15 NOTE — CV Procedure (Addendum)
PROCEDURE:  Right and Left heart catheterization with selective coronary angiography, left ventriculogram.  INDICATIONS:  Aortic insufficiency  The risks, benefits, and details of the procedure were explained to the patient.  The patient verbalized understanding and wanted to proceed.  Informed written consent was obtained.  PROCEDURE TECHNIQUE:  After Xylocaine anesthesia a 63F sheath was placed in the right femoral artery with a single anterior needle wall stick.   Left coronary angiography was done using a Judkins L4 guide catheter.  Right coronary angiography was done using a Judkins R4 guide catheter.  Left ventriculography was done using a pigtail catheter.    CONTRAST:  Total of 60 cc.  COMPLICATIONS:  None.    HEMODYNAMICS:  Aortic pressure was 90/54; LV pressure was 122/12; LVEDP 21.  There was a moderate gradient between the left ventricle and aorta.  RA 9/8 mm Hg; RV 56/8 mm Hg; PA 56/22, mean PA 35 mm Hg; PCWP 22/21 mm Hg.  CO 4.6 L/min; CI 1.86.  Aortic saturation 98%; pulmonary artery saturation 61%.  ANGIOGRAPHIC DATA:   The left main coronary artery is long and patent.  The left anterior descending artery is a medium sized vessel which reaches the apex.  The first diagonal is a large vessel and is widely patent.  THere is mild disease in the LAD  The left circumflex artery is a large vessel proximally.  There is a large first OM and a large atrial branch.  The circumflex system is widely patent.  The right coronary artery is a large dominant vessel with minimal atherosclerosis.  There is a large PDA and a large PLA which are both widely patent.  LEFT VENTRICULOGRAM:  Left ventricular angiogram was done in the 30 RAO projection and revealed normal left ventricular wall motion and systolic function with an estimated ejection fraction of 35%.  LVEDP was 21 mmHg.  IMPRESSIONS:  1. Normal left main coronary artery. 2. Mild atherosclerosis in the left anterior descending artery  and its branches. 3. Normal left circumflex artery and its branches. 4. Mild atherosclerosis in the right coronary artery system. 5. Decreased left ventricular systolic function.  LVEDP .  Ejection fraction 35%. 6.   Moderate pulmonary hypertension, likely due to severe insufficiency.  Decreased cardiac index as well, likely related to valvular heart disease.  RECOMMENDATION:  AVR workup in progress.  He may require additional intervention to mitral and/or tricuspid valves.  Dr. Laneta Simmers to see the patient.  Will keep overnight.  Try for some additional diuresis.  Resume anticoagulation tomorrow for atrial fibrillation.

## 2012-08-15 NOTE — Progress Notes (Signed)
SUBJECTIVE:   Feels better this morning.  He diuresed over 3 kg  In the past 18 hours.  He is able to lie flat.  OBJECTIVE:   Vitals:   Filed Vitals:   08/14/12 1452 08/14/12 2123 08/15/12 0522 08/15/12 1203  BP:  111/74 105/47 109/53  Pulse: 119 108 89 103  Temp: 97.2 F (36.2 C) 98.6 F (37 C) 98.7 F (37.1 C) 98.3 F (36.8 C)  TempSrc: Oral Oral Oral Oral  Resp: 20 20 20 20   Height:      Weight:   94.076 kg (207 lb 6.4 oz)   SpO2: 100% 100% 99% 100%   I&O's:    Intake/Output Summary (Last 24 hours) at 08/15/12 1246 Last data filed at 08/15/12 0900  Gross per 24 hour  Intake    840 ml  Output   4250 ml  Net  -3410 ml   TELEMETRY: Reviewed telemetry pt in atrial fibrillation, better rate control:     PHYSICAL EXAM General: Well developed, well nourished, in no acute distress Head:    Normal cephalic and atramatic  Lungs:  Scant bibasilar crackles Heart:  Irregularly irregular, normal rate Abdomen:  abdomen soft and non-tender  Msk:  Back normal,Normal strength and tone for age. Extremities:  Bilateral pitting lower extremity edema.   Neuro: Alert and oriented X 3. Psych:  Normal affect, responds appropriately   LABS: Basic Metabolic Panel:  Recent Labs  16/10/96 0400 08/15/12 0750  NA 139 139  K 3.5 3.7  CL 102 103  CO2 26 27  GLUCOSE 83 87  BUN 30* 30*  CREATININE 1.86* 1.81*  CALCIUM 8.8 9.1   Liver Function Tests:  Recent Labs  08/13/12 1805  AST 31  ALT 29  ALKPHOS 90  BILITOT 1.7*  PROT 6.9  ALBUMIN 3.4*   No results found for this basename: LIPASE, AMYLASE,  in the last 72 hours CBC:  Recent Labs  08/13/12 1805  WBC 8.4  NEUTROABS 6.6  HGB 14.2  HCT 42.6  MCV 81.9  PLT 204   Cardiac Enzymes: No results found for this basename: CKTOTAL, CKMB, CKMBINDEX, TROPONINI,  in the last 72 hours BNP: No components found with this basename: POCBNP,  D-Dimer: No results found for this basename: DDIMER,  in the last 72  hours Hemoglobin A1C: No results found for this basename: HGBA1C,  in the last 72 hours Fasting Lipid Panel: No results found for this basename: CHOL, HDL, LDLCALC, TRIG, CHOLHDL, LDLDIRECT,  in the last 72 hours Thyroid Function Tests:  Recent Labs  08/13/12 1805  TSH 3.003   Anemia Panel: No results found for this basename: VITAMINB12, FOLATE, FERRITIN, TIBC, IRON, RETICCTPCT,  in the last 72 hours Coag Panel:   Lab Results  Component Value Date   INR 1.36 08/15/2012    RADIOLOGY: No results found.    ASSESSMENT: Severe aortic insufficiency, cardiomyopathy, atrial fibrillation, fluid overload  PLAN:  Continue diuresis today.  We'll decrease dose of IV Lasix to 40 mg IV twice a day.  We'll have to follow renal function given the aggressive diuresis.  Replace potassium today.  No ACE inhibitor given renal insufficiency.  Continue calcium channel blocker for rate control.  He is currently off of his anticoagulation in anticipation of cardiac cath today.  I have consulted cardiac surgery regarding aortic valve replacement.  After cath, would plan on resuming Xarelto, tomorrow.  Digoxin started as well for additional help with rate control.  He was intolerant of  amiodarone and metoprolol. He has fail 2 cardioversions as well.  He also has fairly significant TR and MR diagnosed by echo yesterday.  He may need those addressed at the time of surgery as well.    Corky Crafts., MD  08/15/2012  12:46 PM

## 2012-08-15 NOTE — Progress Notes (Signed)
Patient returned form Cardiac Cath procedure. Patient is alert and oriented. Vital signs are stable. Patient does not complain of pain or discomfort. Heart cath procedure performed in right radial and brachial area. Both surgical sites are a level zero.. Right arm is elevated above the heart. Will continue to monitor to end of shift.

## 2012-08-15 NOTE — Progress Notes (Signed)
Pt assessment completed, denies any pain or discomfort. Pt oriented about heart cath in am, and preparation for procedure, pt encouraged to keep NPO after MN, heat cath video presented, medications given as ordered. We'll continue with POC

## 2012-08-16 DIAGNOSIS — Z01810 Encounter for preprocedural cardiovascular examination: Secondary | ICD-10-CM

## 2012-08-16 LAB — BASIC METABOLIC PANEL
BUN: 29 mg/dL — ABNORMAL HIGH (ref 6–23)
CO2: 21 mEq/L (ref 19–32)
Calcium: 9.6 mg/dL (ref 8.4–10.5)
Chloride: 103 mEq/L (ref 96–112)
Creatinine, Ser: 1.57 mg/dL — ABNORMAL HIGH (ref 0.50–1.35)

## 2012-08-16 MED ORDER — RIVAROXABAN 20 MG PO TABS
20.0000 mg | ORAL_TABLET | Freq: Every day | ORAL | Status: DC
  Administered 2012-08-16: 20 mg via ORAL
  Filled 2012-08-16: qty 1

## 2012-08-16 MED ORDER — FUROSEMIDE 10 MG/ML IJ SOLN
40.0000 mg | Freq: Once | INTRAMUSCULAR | Status: AC
  Administered 2012-08-16: 40 mg via INTRAVENOUS
  Filled 2012-08-16: qty 4

## 2012-08-16 MED ORDER — FUROSEMIDE 40 MG PO TABS: 40.0000 mg | ORAL_TABLET | Freq: Two times a day (BID) | ORAL | Status: DC

## 2012-08-16 MED ORDER — DIGOXIN 250 MCG PO TABS: 0.2500 mg | ORAL_TABLET | Freq: Every day | ORAL | Status: DC

## 2012-08-16 NOTE — Discharge Instructions (Signed)
Follow post radial cath instructions. Will schedule labs for next week.  Office to call.

## 2012-08-16 NOTE — Discharge Summary (Signed)
Patient ID: Reginald Myers MRN: [id number] DOB/AGE: 1956/02/02 57 y.o.  Admit date: 08/13/2012 Discharge date: 08/16/2012  Primary Discharge Diagnosis Acute systolic heart failure Secondary Discharge Diagnosis Severe aortic insufficiency; moderate aortic stenosis; atrial fibrillation; mitral regurgitation; tricuspid regurgitation  Significant Diagnostic Studies: cardiac graphics: Echocardiogram: EF 35-40%, Mod to sev AI, Moderate MR,  Significant TR; cardiac cath-No significant CAD; Mod AS; CI 1.86.  Consults: cardiac surgery-Dr. [facility] Course: 57 y/o man who had significant fluid overload.  He was admitted from the office and diuresed well with IV lasix.  Digoxin was added for rate control.  Echo and cath were done. Cardiac surgery consult was obtained.  He will require aortic valve replacement and possible mitral and tricuspid valve repair.  Home Lasix dose was increased and he was deemed ready for d/c. All of his questions were answered.   Discharge Exam: Blood pressure 104/67, pulse 130, temperature 97.7 F (36.5 C), temperature source Oral, resp. rate 18, height 6\' 1"  (1.854 m), weight 94.847 kg (209 lb 1.6 oz), SpO2 100.00%.   [place]/AT RRR, S1 S2 2/6 systolic , 2/6 holodiastolic murmur CTA bilaterally Soft NT Edema improved Labs:   Lab Results  Component Value Date   WBC 5.1 08/15/2012   HGB 13.1 08/15/2012   HCT 40.5 08/15/2012   MCV 82.5 08/15/2012   PLT 193 08/15/2012    Recent Labs Lab 08/13/12 1805  08/16/12 0530  NA 137  < > 139  K 4.0  < > 4.2  CL 99  < > 103  CO2 24  < > 21  BUN 32*  < > 29*  CREATININE 1.79*  < > 1.57*  CALCIUM 9.0  < > 9.6  PROT 6.9  --   --   BILITOT 1.7*  --   --   ALKPHOS 90  --   --   ALT 29  --   --   AST 31  --   --   GLUCOSE 129*  < > 84  < > = values in this interval not displayed. No results found for this basename: CKTOTAL, CKMB, CKMBINDEX, TROPONINI    No results found for this basename: CHOL   No results found for  this basename: HDL   No results found for this basename: LDLCALC   No results found for this basename: TRIG   No results found for this basename: CHOLHDL   No results found for this basename: LDLDIRECT      Radiology: PFTs, carotid DOppler-no significant disease EKG: AFib , rate controlled  FOLLOW UP PLANS AND APPOINTMENTS    Medication List         Coenzyme Q-10 100 MG capsule  Take 100 mg by mouth daily. Only taking 3 to 4 times per week     digoxin 0.25 MG tablet  Commonly known as:  LANOXIN  Take 1 tablet (0.25 mg total) by mouth daily.     diltiazem 180 MG 24 hr capsule  Commonly known as:  TIAZAC  Take 2 capsules (360 mg total) by mouth daily.     fish oil-omega-3 fatty acids 1000 MG capsule  Take 2 g by mouth daily.     furosemide 40 MG tablet  Commonly known as:  LASIX  Take 1 tablet (40 mg total) by mouth 2 (two) times daily.     multivitamin with minerals Tabs  Take 1 tablet by mouth daily.     Red Yeast Rice 600 MG Caps  Take 600  mg by mouth daily.     rosuvastatin 10 MG tablet  Commonly known as:  CRESTOR  Take 10 mg by mouth 3 (three) times a week.     XARELTO 20 MG Tabs  Generic drug:  Rivaroxaban  Take 20 mg by mouth daily.         BRING ALL MEDICATIONS WITH YOU TO FOLLOW UP APPOINTMENTS  Time spent with patient to include physician time: 40 minutes answering questions and arranging a plan for upcoming surgery Signed: Tyrica Afzal S. 08/16/2012, 12:12 PM

## 2012-08-16 NOTE — Progress Notes (Signed)
VASCULAR LAB PRELIMINARY  PRELIMINARY  PRELIMINARY  PRELIMINARY  Pre-op Cardiac Surgery  Carotid Findings:  Bilateral:  Less than 39% ICA stenosis.  Vertebral artery flow is antegrade.      Upper Extremity Right Left  Brachial Pressures 120  Triphasic  113 Triphasic   Radial Waveforms Triphasic  Triphasic   Ulnar Waveforms Triphasic  Triphasic   Palmar Arch (Allen's Test) Within normal limits  Doppler obliterates with radial compression, normal with ulnar compression    Reginald Myers, RVT 08/16/2012, 3:25 PM

## 2012-08-16 NOTE — Progress Notes (Signed)
Reginald Myers to be D/C'd Home per MD order.  Discussed with the patient and all questions fully answered.    Medication List         Coenzyme Q-10 100 MG capsule  Take 100 mg by mouth daily. Only taking 3 to 4 times per week     digoxin 0.25 MG tablet  Commonly known as:  LANOXIN  Take 1 tablet (0.25 mg total) by mouth daily.     diltiazem 180 MG 24 hr capsule  Commonly known as:  TIAZAC  Take 2 capsules (360 mg total) by mouth daily.     fish oil-omega-3 fatty acids 1000 MG capsule  Take 2 g by mouth daily.     furosemide 40 MG tablet  Commonly known as:  LASIX  Take 1 tablet (40 mg total) by mouth 2 (two) times daily.     multivitamin with minerals Tabs  Take 1 tablet by mouth daily.     Red Yeast Rice 600 MG Caps  Take 600 mg by mouth daily.     rosuvastatin 10 MG tablet  Commonly known as:  CRESTOR  Take 10 mg by mouth 3 (three) times a week.     XARELTO 20 MG Tabs  Generic drug:  Rivaroxaban  Take 20 mg by mouth daily.        VVS, Skin clean, dry and intact without evidence of skin break down, no evidence of skin tears noted. IV catheter discontinued intact. Site without signs and symptoms of complications. Dressing and pressure applied.  An After Visit Summary was printed and given to the patient. Patient escorted via WC, and D/C home via private auto.  [name] 08/16/2012 8:17 PM

## 2012-08-16 NOTE — Progress Notes (Signed)
[id number] Cardiac Rehab Pt states that he is walking independently denies any problems. Completed pre-op education with pt and wife. Pt given pre-op surgery booklet and put video for them to view. We discussed CHF, sternal precautions, walking post-op and not using arms after surgery. He voices understanding. Melina Copa RN

## 2012-08-17 ENCOUNTER — Other Ambulatory Visit: Payer: Self-pay | Admitting: *Deleted

## 2012-08-17 ENCOUNTER — Encounter: Payer: Self-pay | Admitting: Surgery

## 2012-08-17 DIAGNOSIS — I359 Nonrheumatic aortic valve disorder, unspecified: Secondary | ICD-10-CM

## 2012-08-17 NOTE — Progress Notes (Unsigned)
Patient ID: Reginald Myers, male   DOB: 1955/03/14, 57 y.o.   MRN: [id number]       [address]       [place] [address]             [phone]                       Cardiothoracic surgery consultation:    [facility] Medical Record #[id number] Date of Birth: [birth year]  Referring Physician:  Everette Rank, MD  Pearla Dubonnet, MD  Chief Complaint:   Severe aortic insufficiency  History of Present Illness:     The patient is a 57 year old gentleman in previously good health who was diagnosed with moderate to severe aortic insufficiency in December 2013 after his primary care physician heard a heart murmur. An echocardiogram at that time showed normal left ventricular function. He said he felt well. He was seen again last month by Dr. Eldridge Dace and reported that he had not felt well for about 1 month with exertional fatigue. He is not able to do his regular exercise routine. He was noted to be tachycardiac and was found to be in atrial fibrillation with rapid ventricular response. A followup echocardiogram in May of 2014 showed a significant decrease in his left ventricular function. An attempt was made at rate control.The patient continued to have fatigue and shortness of breath. Cardioversion was attempted but the patient only maintained normal rhythm for a brief period of time. He was started on amiodarone but did not tolerate that with nausea. He had another unsuccessful attempt at cardioversion. He was started on metoprolol but felt poorly after the first dose and gained about 18 pounds over a couple weeks with marked swelling in his legs and abdomen with development of orthopnea. A repeat echocardiogram on 08/14/2012 showed left ventricular ejection fraction of 35-40% with diffuse hypokinesis. 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 There is moderate mitral  regurgitation. There is moderate to severe tricuspid regurgitation with a mildly dilated right ventricle. Pulmonary systolic pressure was moderately increased with a peak pressure estimated 66 mmHg. He underwent right and left heart catheterization showing no significant coronary disease. He had pulmonary hypertension with PA pressure of 56/22 with a wedge pressure of 22. Cardiac index was low at 1.86. Left ventricular ejection fraction was 35% with end-diastolic pressure of 21. His edema and dyspnea have resolved with diuresis and he is now able to lay flat.  Current Activity/ Functional Status: Patient is independent with mobility/ambulation, transfers, ADL's, IADL's.   Past Medical History  Diagnosis Date  . Heart murmur   . Dysrhythmia   . Hypertension   . Hyperlipidemia   . Right knee DJD     prior meniscal surgery  . Bell's palsy 09/2009    right-residual weakness with fatique   . Hemorrhoids   . Atrial fibrillation   . Aortic valve disorders   . Orthopnea   . Testicular cancer 1994  . Shortness of breath   . Acute systolic heart failure     Past Surgical History  Procedure Laterality Date  . Orchiectomy Left 1993  . Tee without cardioversion N/A 07/13/2012    Procedure: TRANSESOPHAGEAL ECHOCARDIOGRAM (TEE);  Surgeon: Corky Crafts, MD;  Location: [facility] ENDOSCOPY;  Service: Cardiovascular;  Laterality: N/A;  . Cardioversion N/A 07/13/2012    Procedure: CARDIOVERSION;  Surgeon: Donnie Coffin.  Eldridge Dace, MD;  Location: [facility] ENDOSCOPY;  Service: Cardiovascular;  Laterality: N/A;  . Cardioversion N/A 08/08/2012    Procedure: CARDIOVERSION;  Surgeon: Corky Crafts, MD;  Location: [facility] ENDOSCOPY;  Service: Cardiovascular;  Laterality: N/A;  . Knee arthroscopy w/ meniscal repair Right 1988    History  Smoking status  . Never Smoker   Smokeless tobacco  . Never Used    History  Alcohol Use  . Yes    Comment: 57/2014 "glass of wine couple times a month"    History   Social  History  . Marital Status: Single    Spouse Name: N/A    Number of Children: N/A  . Years of Education: N/A   Occupational History  . Owns a wine shop   Social History Main Topics  . Smoking status: Never Smoker   . Smokeless tobacco: Never Used  . Alcohol Use: Yes     Comment: 57/2014 "glass of wine couple times a month"  . Drug Use: No  . Sexually Active: Yes   Other Topics Concern  . Not on file   Social History Narrative  . No narrative on file    Allergies  Allergen Reactions  . Amiodarone     intolerance    Current Outpatient Prescriptions  Medication Sig Dispense Refill  . Coenzyme Q-10 100 MG capsule Take 100 mg by mouth daily. Only taking 3 to 4 times per week      . digoxin (LANOXIN) 0.25 MG tablet Take 1 tablet (0.25 mg total) by mouth daily.  30 tablet  11  . diltiazem (TIAZAC) 180 MG 24 hr capsule Take 2 capsules (360 mg total) by mouth daily.      . fish oil-omega-3 fatty acids 1000 MG capsule Take 2 g by mouth daily.      . furosemide (LASIX) 40 MG tablet Take 1 tablet (40 mg total) by mouth 2 (two) times daily.  30 tablet    . Multiple Vitamin (MULTIVITAMIN WITH MINERALS) TABS Take 1 tablet by mouth daily.      . Red Yeast Rice 600 MG CAPS Take 600 mg by mouth daily.      . Rivaroxaban (XARELTO) 20 MG TABS Take 20 mg by mouth daily.      . rosuvastatin (CRESTOR) 10 MG tablet Take 10 mg by mouth 3 (three) times a week.       No current facility-administered medications for this visit.     Family history: No family history of aortic valve disease   Review of Systems:     Cardiac Review of Systems: Y or N  Chest Pain [  n  ]  Resting SOB Milo.Brash   ] Exertional SOB  [ y ]  Pollyann [place].Etienne ]   Pedal Edema Cove.Etienne  ]    Palpitations [ y ] Syncope  Milo.Brash  ]  Presyncope [n ]   General Review of Systems: [Y] = yes [  ]=no  Constitional: recent weight change [y]; anorexia [ n ]; fatigue [y]; nausea [n ]; night sweats [n  ]; fever [n  ]; or chills [ n ];  Dental: poor dentition[n  ]; Last Dentist visit: within the past year   Eye : blurred vision [n ]; diplopia Milo.Brash ]; vision changes [ n ];  Amaurosis fugax[ n];  Resp: cough [n  ];  wheezing[ n ];  hemoptysis[ n ]; shortness of breath[ y ]; paroxysmal nocturnal dyspnea[ n ]; dyspnea on exertion[y]; or orthopnea[ y ];   GI:  gallstones[ n], vomiting[ n ];  dysphagia[n]; melena[ n ];  hematochezia [n ]; heartburn[ n ];   Hx of  Colonoscopy[  n];  GU: kidney stones [n  ]; hematuria[n  ];   dysuria [n ];  nocturia[  n];  history of     obstruction [n  ];              Skin: rash, swelling[ n ];, hair loss[n];  peripheral edema[n];  or itching[ n ];  Musculosketetal: myalgias[ n ];  joint swelling[ n ];  joint erythema[ n ];  joint pain[ n ];  back pain[ n ];   Heme/Lymph: bruising[ n ];  bleeding[ n;  anemia[ n ];   Neuro: TIA[ n ];  headaches[  n];  stroke[  n];  vertigo[ n ];  seizures[ n ];   paresthesias[  n];  difficulty walking[n  ];   Psych:depression[ n]; anxiety[ n ];   Endocrine: diabetes[  n  thyroid dysfunction[ n ]   Immunizations: Flu [ n ]; Pneumococcal[ n ];   Other:  Physical Exam   General appearance: He is a well-developed thin white male in no distress. HEENT: Normocephalic and atraumatic. Pupils are equal and reactive to light and accommodation. Extraocular muscles are intact. Throat is clear. Teeth are in good condition. Neck: There is no JVD. Carotid pulses are palpable with no bruits. There is no adenopathy. Lungs: Clear Heart: Irregular rate and rhythm with a 3/6 diastolic AI murmur. There is a 1/6 systolic murmur at apex.  Abdomen: Bowel sounds are present. Soft and nontender. There are no palpable masses organomegaly. Extremities: Mild ankle edema bilaterally. Pedal pulses are palpable bilaterally. Neurological: Alert and oriented x3. Motor  and sensory exams are grossly normal.   Diagnostic Studies & Laboratory data:    *[place]*                *[facility]*                      [address]                     [place], [place] [address]                         [phone]   ------------------------------------------------------------ Transthoracic Echocardiography  Patient:    Reginald Myers, Reginald Myers MR #:       [id number] Study Date: 08/14/2012 Gender:     M Age:        56 Height:     185.4cm Weight:     98kg BSA:        2.27m^2 Pt. Status: Room:       4742    PERFORMING   [facility], [facility]  SONOGRAPHER  Silvano Bilis, RCS  ADMITTING    [place], Renelda Loma  ATTENDING    [employer], Renelda Loma  [place], [place] cc:  ------------------------------------------------------------ LV EF: 35% -   40%  ------------------------------------------------------------ Indications:      Atrial  fibrillation - 427.31.  Aortic insufficiency 424.1.  ------------------------------------------------------------ History:   PMH:  Aortic valve disorder.  Dyspnea.  ------------------------------------------------------------ Study Conclusions  - Left ventricle: The cavity size was normal. Systolic   function was moderately reduced. The estimated ejection   fraction was in the range of 35% to 40%. Diffuse   hypokinesis. - Aortic valve: Functionally bicuspid; severely calcified   leaflets. Moderate to severe regurgitation. Valve area:   1.3cm^2(VTI). Valve area: 1.54cm^2 (Vmax). - Mitral valve: Moderate regurgitation. - Left atrium: The atrium was mildly dilated. - Right ventricle: The cavity size was mildly dilated. Wall   thickness was normal. - Right atrium: The atrium was moderately dilated. - Tricuspid valve: Moderate-severe regurgitation. - Pulmonary arteries: Systolic pressure was moderately   increased. PA peak pressure: 66mm Hg (S). Transthoracic echocardiography.  M-mode, complete  2D, spectral Doppler, and color Doppler.  Height:  Height: 185.4cm. Height: 73in.  Weight:  Weight: 98kg. Weight: 215.6lb.  Body mass index:  BMI: 28.5kg/m^2.  Body surface area:    BSA: 2.78m^2.  Blood pressure:     89/42.  Patient status:  Inpatient.  Location:  Echo laboratory.  ------------------------------------------------------------  ------------------------------------------------------------ Left ventricle:  The cavity size was normal. Systolic function was moderately reduced. The estimated ejection fraction was in the range of 35% to 40%. Diffuse hypokinesis.  ------------------------------------------------------------ Aortic valve:   Functionally bicuspid; severely calcified leaflets. Mobility was not restricted.  Doppler: Transvalvular velocity was within the normal range. There was no stenosis.  Moderate to severe regurgitation.    Valve area: 1.3cm^2(VTI). Indexed valve area: 0.57cm^2/m^2 (VTI). Peak velocity ratio of LVOT to aortic valve: 0.23. Valve area: 1.54cm^2 (Vmax). Indexed valve area: 0.68cm^2/m^2 (Vmax).    Mean gradient: 26mm Hg (S). Peak gradient: 39mm Hg (S).  ------------------------------------------------------------ Aorta:  Aortic root: The aortic root was normal in size.  ------------------------------------------------------------ Mitral valve:   Mildly thickened leaflets . Mobility was not restricted.  Doppler:  Transvalvular velocity was within the normal range. There was no evidence for stenosis.  Moderate regurgitation.  ------------------------------------------------------------ Left atrium:  The atrium was mildly dilated.  ------------------------------------------------------------ Atrial septum:  Poorly visualized.  ------------------------------------------------------------ Right ventricle:  The cavity size was mildly dilated. Wall thickness was normal. Systolic function was low normal.     ------------------------------------------------------------ Pulmonic valve:    Structurally normal valve.    Doppler: Transvalvular velocity was within the normal range. There was no evidence for stenosis.  Mild regurgitation.  ------------------------------------------------------------ Tricuspid valve:   Mildly thickened leaflets.  Doppler: Transvalvular velocity was within the normal range. Moderate-severe regurgitation.  ------------------------------------------------------------ Pulmonary artery:   Poorly visualized. Systolic pressure was moderately increased.  ------------------------------------------------------------ Right atrium:  The atrium was moderately dilated.  ------------------------------------------------------------ Pericardium:  There was no pericardial effusion.  ------------------------------------------------------------ Systemic veins: Inferior vena cava: The vessel was dilated; the respirophasic diameter changes were blunted (< 50%); findings are consistent with elevated central venous pressure.  ------------------------------------------------------------  2D measurements        Normal  Doppler measurements    Norma Left ventricle                                         l LVID ED,   60.3 mm     43-52   Main pulmonary artery chord,  Pressure,   66 mm Hg    =30 PLAX                           S LVID ES,   49.7 mm     23-38   Pressure    24 mm Hg    ----- chord,                         ED PLAX                           LVOT FS, chord,   18 %      >29     Peak vel,  72. cm/s     ----- PLAX                           S            3 LVPW, ED   9.86 mm     ------  Aortic valve IVS/LVPW   1.13        <1.3    Peak vel,  311 cm/s     ----- ratio, ED                      S Ventricular septum             Mean vel,  237 cm/s     ----- IVS, ED    11.1 mm     ------  S LVOT                           VTI, S     60. cm       ----- Diam,  S      29 mm     ------               9 Area       6.61 cm^2   ------  Mean        26 mm Hg    ----- Aorta                          gradient, Root diam,   41 mm     ------  S ED                             Peak        39 mm Hg    ----- Left atrium                    gradient, AP dim       41 mm     ------  S AP dim     1.81 cm/m^2 <2.2    Area, VTI  1.3 cm^2     ----- index                          Area index 0.5 cm^2/m^2 ----- Right ventricle                (VTI)        7 RVID ED,   36.3 mm     19-38   Peak vel  0.2          ----- PLAX                           ratio,       3                                LVOT/AV                                Area, Vmax 1.5 cm^2     -----                                             4                                Area index 0.6 cm^2/m^2 -----                                (Vmax)       8                                Regurg PHT 293 ms       -----                                Tricuspid valve                                Regurg     356 cm/s     -----                                peak vel                                Peak RV-RA  51 mm Hg    -----                                gradient,                                S                                Max regurg 356 cm/s     -----                                vel                                Systemic veins  Estimated   15 mm Hg    -----                                CVP                                Right ventricle                                Pressure,   66 mm Hg    <30                                S                                Pulmonic valve                                Regurg     151 cm/s     -----                                vel, ED   ------------------------------------------------------------ Prepared and Electronically Authenticated by  Everette Rank, MD 2014-07-08T15:36:[id number]       Cardiac Cath:  HEMODYNAMICS: Aortic  pressure was 90/54; LV pressure was 122/12; LVEDP 21. There was a moderate gradient between the left ventricle and aorta. RA 9/8 mm Hg; RV 56/8 mm Hg; PA 56/22, mean PA 35 mm Hg; PCWP 22/21 mm Hg. CO 4.6 L/min; CI 1.86. Aortic saturation 98%; pulmonary artery saturation 61%.  ANGIOGRAPHIC DATA: The left main coronary artery is long and patent.  The left anterior descending artery is a medium sized vessel which reaches the apex. The first diagonal is a large vessel and is widely patent. THere is mild disease in the LAD  The left circumflex artery is a large vessel proximally. There is a large first OM and a large atrial branch. The circumflex system is widely patent.  The right coronary artery is a large dominant vessel with minimal atherosclerosis. There is a large PDA and a large PLA which are both widely patent.  LEFT VENTRICULOGRAM: Left ventricular angiogram was done in the 30 RAO projection and revealed normal left ventricular wall motion and systolic function with an estimated ejection fraction of 35%. LVEDP was 21 mmHg.  IMPRESSIONS:  1. Normal left main coronary artery. 2. Mild atherosclerosis in the left anterior descending artery and its branches. 3. Normal left circumflex artery and its branches. 4. Mild atherosclerosis in the right coronary artery system. 5. Decreased left ventricular systolic function. LVEDP . Ejection fraction 35%. 6. Moderate pulmonary hypertension, likely due to severe insufficiency. Decreased cardiac index as well, likely related to valvular heart disease.      Recent Radiology Findings:   No results found.    Recent Lab Findings: Lab Results  Component Value Date   WBC 5.1 08/15/2012   HGB 13.1 08/15/2012   HCT 40.5 08/15/2012   PLT 193 08/15/2012   GLUCOSE 84 08/16/2012   ALT 29 08/13/2012   AST 31 08/13/2012   NA 139 08/16/2012  K 4.2 08/16/2012   CL 103 08/16/2012   CREATININE 1.57* 08/16/2012   BUN 29* 08/16/2012   CO2 21 08/16/2012   TSH 3.003 08/13/2012    INR 1.36 08/15/2012      Assessment / Plan:      He has moderate to severe aortic insufficiency diagnosed in December 2013 and now presents with progressive congestive heart failure symptoms with marked weight gain and edema, orthopnea, and atrial fibrillation with rapid ventricular response. Repeat echocardiogram continues to show moderate to severe aortic insufficiency but his left ventricular ejection fraction has markedly decreased and there is moderate mitral and tricuspid regurgitation. The mitral and tricuspid valves appear to have structurally normal leaflets but there appears to be annular dilatation due to ventricular enlargement. His atrial fibrillation was difficult to control and he would not maintain sinus rhythm after cardioversion. He did not tolerate amiodarone. I think that the best treatment would be to proceed with aortic valve replacement and mitral and tricuspid valve annuloplasty as well as a Maze procedure to try to maintain sinus rhythm. His mitral and tricuspid regurgitation looks significant and I think that if these valves are not treated he would likely have continued congestive heart failure symptoms. I discussed the operative recommendations with the patient and his wife in detail. We discussed the pros and cons of mechanical and tissue valves. He is 57 years old but is seriously thinking about using a tissue aortic valve to avoid long-term need for Coumadin. He understands that he may continue to have problems with atrial fibrillation and need to be on long-term Coumadin anyway. I told him that I thought the Maze procedure would have an 80% chance of maintaining sinus rhythm in his case. I discussed the operative procedure with the patient and family including alternatives, benefits and risks; including but not limited to bleeding, blood transfusion, infection, stroke, myocardial infarction, graft failure, heart block requiring a permanent pacemaker, organ dysfunction, and  death.  Cheree Ditto understands and agrees to proceed.  We will schedule surgery for Monday 09/03/12. I told him to think about the choice of aortic valve prosthesis so that we can make a decision prior to surgery.      @me1 @ 08/17/2012 3:02 PM

## 2012-08-30 ENCOUNTER — Other Ambulatory Visit: Payer: Self-pay | Admitting: *Deleted

## 2012-08-30 ENCOUNTER — Encounter (HOSPITAL_COMMUNITY)
Admission: RE | Admit: 2012-08-30 | Discharge: 2012-08-30 | Disposition: A | Discharge status: Home / Self Care | Payer: BC Managed Care – PPO | Source: Ambulatory Visit | Attending: Surgery | Admitting: Surgery

## 2012-08-30 ENCOUNTER — Encounter (HOSPITAL_COMMUNITY): Payer: Self-pay

## 2012-08-30 VITALS — BP 106/72 | HR 82 | Temp 97.7°F | Resp 18 | Ht 73.0 in | Wt 203.5 lb

## 2012-08-30 DIAGNOSIS — G510 Bell's palsy: Secondary | ICD-10-CM | POA: Insufficient documentation

## 2012-08-30 DIAGNOSIS — R51 Headache: Secondary | ICD-10-CM | POA: Insufficient documentation

## 2012-08-30 DIAGNOSIS — Z01812 Encounter for preprocedural laboratory examination: Secondary | ICD-10-CM | POA: Insufficient documentation

## 2012-08-30 DIAGNOSIS — I359 Nonrheumatic aortic valve disorder, unspecified: Secondary | ICD-10-CM | POA: Insufficient documentation

## 2012-08-30 DIAGNOSIS — I517 Cardiomegaly: Secondary | ICD-10-CM | POA: Insufficient documentation

## 2012-08-30 DIAGNOSIS — Z01818 Encounter for other preprocedural examination: Secondary | ICD-10-CM | POA: Insufficient documentation

## 2012-08-30 DIAGNOSIS — I509 Heart failure, unspecified: Secondary | ICD-10-CM | POA: Insufficient documentation

## 2012-08-30 DIAGNOSIS — I10 Essential (primary) hypertension: Secondary | ICD-10-CM | POA: Insufficient documentation

## 2012-08-30 DIAGNOSIS — I079 Rheumatic tricuspid valve disease, unspecified: Secondary | ICD-10-CM | POA: Insufficient documentation

## 2012-08-30 DIAGNOSIS — Z01810 Encounter for preprocedural cardiovascular examination: Secondary | ICD-10-CM | POA: Insufficient documentation

## 2012-08-30 DIAGNOSIS — I5020 Unspecified systolic (congestive) heart failure: Secondary | ICD-10-CM | POA: Insufficient documentation

## 2012-08-30 DIAGNOSIS — I080 Rheumatic disorders of both mitral and aortic valves: Secondary | ICD-10-CM | POA: Insufficient documentation

## 2012-08-30 DIAGNOSIS — E785 Hyperlipidemia, unspecified: Secondary | ICD-10-CM | POA: Insufficient documentation

## 2012-08-30 LAB — APTT: aPTT: 40 seconds — ABNORMAL HIGH (ref 24–37)

## 2012-08-30 LAB — BLOOD GAS, ARTERIAL
Acid-base deficit: 0 mmol/L (ref 0.0–2.0)
Bicarbonate: 23.4 mEq/L (ref 20.0–24.0)
O2 Saturation: 98.2 %
Patient temperature: 98.6
pO2, Arterial: 102 mmHg — ABNORMAL HIGH (ref 80.0–100.0)

## 2012-08-30 LAB — URINALYSIS, ROUTINE W REFLEX MICROSCOPIC
Bilirubin Urine: NEGATIVE
Glucose, UA: NEGATIVE mg/dL
Nitrite: NEGATIVE
Specific Gravity, Urine: 1.023 (ref 1.005–1.030)
pH: 6 (ref 5.0–8.0)

## 2012-08-30 LAB — COMPREHENSIVE METABOLIC PANEL
ALT: 20 U/L (ref 0–53)
BUN: 35 mg/dL — ABNORMAL HIGH (ref 6–23)
CO2: 21 mEq/L (ref 19–32)
Calcium: 9.8 mg/dL (ref 8.4–10.5)
Creatinine, Ser: 1.64 mg/dL — ABNORMAL HIGH (ref 0.50–1.35)
GFR calc Af Amer: 53 mL/min — ABNORMAL LOW (ref >90)
GFR calc non Af Amer: 46 mL/min — ABNORMAL LOW (ref >90)
Glucose, Bld: 100 mg/dL — ABNORMAL HIGH (ref 70–99)
Total Protein: 7.1 g/dL (ref 6.0–8.3)

## 2012-08-30 LAB — HEMOGLOBIN A1C
Hgb A1c MFr Bld: 5.4 % (ref <5.7)
Mean Plasma Glucose: 108 mg/dL (ref <117)

## 2012-08-30 LAB — CBC
Hemoglobin: 14.8 g/dL (ref 13.0–17.0)
MCH: 27.4 pg (ref 26.0–34.0)
MCHC: 33.6 g/dL (ref 30.0–36.0)
MCV: 81.3 fL (ref 78.0–100.0)
RBC: 5.41 MIL/uL (ref 4.22–5.81)

## 2012-08-30 LAB — PROTIME-INR
INR: 2.17 — ABNORMAL HIGH (ref 0.00–1.49)
Prothrombin Time: 23.5 seconds — ABNORMAL HIGH (ref 11.6–15.2)

## 2012-08-30 NOTE — Progress Notes (Signed)
Spoke with Alycia Rossetti regarding abnormal  labs ( creatinine, PT, and INR) ; Alycia Rossetti will review and speak with Dr.Bartle. Pt chart left for review with Revonda Standard, PA (anesthesia) regarding creatinine 1.64. PT, 23.5 and INR 2.17.

## 2012-08-30 NOTE — Progress Notes (Signed)
Spoke with Alycia Rossetti regarding pt instructions to discontinue Xarelto.; pt made aware to Stop taking Aspirin, Xarelto and herbal medications (Coenzyme Q-10 100 MG capsule, fish oil-omega-3 fatty acids 1000 MG capsule, Red Yeast Rice 600 MG CAPS).  Do not take any NSAIDs ie: Ibuprofen, Advil, Naproxen or any medication containing Aspirin. Pt avised to bring IS and teaching packet on DOS.

## 2012-08-30 NOTE — Pre-Procedure Instructions (Signed)
[place]  08/30/2012   Your procedure is scheduled on: Monday, September 03, 2012  Report to [facility] at 5:30 AM.  Call this number if you have problems the morning of surgery: [phone]   Remember:   Do not eat food or drink liquids after midnight.   Take these medicines the morning of surgery with A SIP OF WATER: diltiazem (DILACOR XR) 180 MG 24 hr capsule Stop taking Aspirin, Rivaroxaban (XARELTO) 20 MG TABS and herbal medications ( Coenzyme Q-10 100 MG capsule, fish oil-omega-3 fatty acids 1000 MG capsule, Multiple Vitamin (MULTIVITAMIN WITH MINERALS) TABS Red Yeast Rice 600 MG CAPS)    Do not take any NSAIDs ie: Ibuprofen, Advil, Naproxen or any medication containing Aspirin.  Do not wear jewelry, make-up or nail polish.  Do not wear lotions, powders, or perfumes. You may NOT wear deodorant.  Do not shave 48 hours prior to surgery. Men may shave face and neck.  Do not bring valuables to the hospital.  [facility] is not responsible for any belongings or valuables.  Contacts, dentures or bridgework may not be worn into surgery.  Leave suitcase in the car. After surgery it may be brought to your room.  For patients admitted to the hospital, checkout time is 11:00 AM the day of discharge.   Patients discharged the day of surgery will not be allowed to drive home.  Name and phone number of your driver:   Special Instructions: Shower using CHG 2 nights before surgery and the night before surgery.  If you shower the day of surgery use CHG.  Use special wash - you have one bottle of CHG for all showers.  You should use approximately 1/3 of the bottle for each shower.   Please read over the following fact sheets that you were given: Pain Booklet, Coughing and Deep Breathing, Blood Transfusion Information, Open Heart Packet, MRSA Information and Surgical Site Infection Prevention

## 2012-08-31 NOTE — Progress Notes (Addendum)
Anesthesia chart review:  Patient is a 57 year old male scheduled for AV replacement, MV repair, TR repair, Maze procedure by Dr. Laneta Simmers.  Procedure was scheduled for 09/03/12, but it appears that it is now moved to 09/07/12.  History includes severe AR/moderate AS, moderate MR, moderate to severe TR, afib s/p unsuccessful cardioversion X 2, systolic CHF, HTN, Bell's Palsy, testicular cancer s/p orchiectomy '94, HLD, headaches, non-smoker. PCP is Dr. Pearla Dubonnet.  Cardiologist is Dr. Eldridge Dace.  EKG on 08/30/12 showed afib @ 90 bpm, new lateral non-specific ST/T wave abnormality  Cardiac cath on 08/15/12 showed: 1. Normal left main coronary artery. 2. Mild atherosclerosis in the left anterior descending artery and its branches. 3. Normal left circumflex artery and its branches. 4. Mild atherosclerosis in the right coronary artery system. 5. Decreased left ventricular systolic function. LVEDP . Ejection fraction 35%. 6. Moderate pulmonary hypertension, likely due to severe insufficiency. Decreased cardiac index as well, likely related to valvular heart disease.   2D echo on 08/14/12 showed: - Left ventricle: The cavity size was normal. Systolic function was moderately reduced. The estimated ejection fraction was in the range of 35% to 40%. Diffuse hypokinesis. - Aortic valve: Functionally bicuspid; severely calcified leaflets. Moderate to severe regurgitation. Valve area: 1.3cm^2(VTI). Valve area: 1.54cm^2 (Vmax). - Mitral valve: Moderate regurgitation. - Left atrium: The atrium was mildly dilated. - Right ventricle: The cavity size was mildly dilated. Wall thickness was normal. - Right atrium: The atrium was moderately dilated. - Tricuspid valve: Moderate-severe regurgitation. - Pulmonary arteries: Systolic pressure was moderately increased. PA peak pressure: 66mm Hg (S).  CXR on 08/30/12 showed cardiomegaly.  No acute cardiopulmonary disease.  Preoperative labs noted.  BUN/CR  slightly more increased at 35/1.64 (previous Cr 1.48 and 1.57).  PT/PTT elevated.  The PAT RN has already notified Dr. Sharee Pimple office of abnormal labs.  Patient held Xarelto starting yesterday.  Dr. Laneta Simmers is planning to have patient repeat labs on 09/05/12 to re-evaluate.  Additional recommendations, if any, will depend of his follow-up lab results.  Velna Ochs [facility] Short Stay Center/Anesthesiology Phone [phone] 08/31/2012 9:39 AM

## 2012-09-05 ENCOUNTER — Encounter (HOSPITAL_COMMUNITY): Payer: Self-pay

## 2012-09-05 ENCOUNTER — Encounter (HOSPITAL_COMMUNITY)
Admission: RE | Admit: 2012-09-05 | Discharge: 2012-09-05 | Disposition: A | Discharge status: Home / Self Care | Payer: BC Managed Care – PPO | Source: Ambulatory Visit | Attending: Surgery | Admitting: Surgery

## 2012-09-05 VITALS — BP 113/73 | HR 76 | Temp 97.7°F | Resp 18 | Ht 73.0 in | Wt 201.3 lb

## 2012-09-05 DIAGNOSIS — I359 Nonrheumatic aortic valve disorder, unspecified: Secondary | ICD-10-CM

## 2012-09-05 LAB — URINALYSIS, ROUTINE W REFLEX MICROSCOPIC
Glucose, UA: NEGATIVE mg/dL
Hgb urine dipstick: NEGATIVE
Ketones, ur: NEGATIVE mg/dL
Leukocytes, UA: NEGATIVE
Protein, ur: NEGATIVE mg/dL
Urobilinogen, UA: 1 mg/dL (ref 0.0–1.0)

## 2012-09-05 LAB — COMPREHENSIVE METABOLIC PANEL
AST: 26 U/L (ref 0–37)
Albumin: 3.6 g/dL (ref 3.5–5.2)
BUN: 35 mg/dL — ABNORMAL HIGH (ref 6–23)
Calcium: 9.6 mg/dL (ref 8.4–10.5)
Chloride: 103 mEq/L (ref 96–112)
Creatinine, Ser: 1.67 mg/dL — ABNORMAL HIGH (ref 0.50–1.35)
Total Bilirubin: 1.3 mg/dL — ABNORMAL HIGH (ref 0.3–1.2)
Total Protein: 7.1 g/dL (ref 6.0–8.3)

## 2012-09-05 LAB — BLOOD GAS, ARTERIAL
Acid-base deficit: 0.4 mmol/L (ref 0.0–2.0)
Drawn by: 206361
FIO2: 0.21 %
O2 Saturation: 98 %
Patient temperature: 98.6
TCO2: 24.2 mmol/L (ref 0–100)
pCO2 arterial: 34.1 mmHg — ABNORMAL LOW (ref 35.0–45.0)

## 2012-09-05 LAB — CBC
HCT: 45.7 % (ref 39.0–52.0)
MCH: 26.4 pg (ref 26.0–34.0)
MCHC: 32.4 g/dL (ref 30.0–36.0)
MCV: 81.5 fL (ref 78.0–100.0)
Platelets: 167 10*3/uL (ref 150–400)
RDW: 16.1 % — ABNORMAL HIGH (ref 11.5–15.5)

## 2012-09-05 LAB — APTT: aPTT: 31 seconds (ref 24–37)

## 2012-09-05 LAB — PROTIME-INR: Prothrombin Time: 14.4 seconds (ref 11.6–15.2)

## 2012-09-05 LAB — TYPE AND SCREEN

## 2012-09-05 NOTE — Progress Notes (Signed)
Per Revonda Standard, [place] Dr. Sharee Pimple office is aware of repeat lab work

## 2012-09-05 NOTE — Progress Notes (Signed)
Anesthesia Update:  See my note from 08/31/12.  Patient had repeat labs today.  I called patient's Cr to Forestine Na at [place].  If Dr. Laneta Simmers is comfortable with follow-up labs then I anticipate that patient can proceed.  Velna Ochs [facility] Short Stay Center/Anesthesiology Phone [phone] 09/05/2012 4:04 PM

## 2012-09-05 NOTE — Progress Notes (Signed)
Per Geraldine Contras at [place] use original consent order

## 2012-09-05 NOTE — Pre-Procedure Instructions (Signed)
Reginald Myers  09/05/2012   Your procedure is scheduled on: August 1  Report to [facility] Short Stay Center at 5:30 AM.  Call this number if you have problems the morning of surgery: [phone]   Remember:   Do not eat food or drink liquids after midnight.   Take these medicines the morning of surgery with A SIP OF WATER: diltiazem (DILACOR XR) 180 MG 24 hr capsule  Continue to hold Aspirin, Rivaroxaban (XARELTO) 20 MG TABS and herbal medications ( Coenzyme Q-10 100 MG capsule, fish oil-omega-3 fatty acids 1000 MG capsule, Multiple Vitamin (MULTIVITAMIN WITH MINERALS) TABS Red Yeast Rice 600 MG CAPS)    Do not take any NSAIDs ie: Ibuprofen, Advil, Naproxen or any medication containing Aspirin.  Do not wear jewelry, make-up or nail polish.  Do not wear lotions, powders, or perfumes. Do not wear deodorant.  Do not shave 48 hours prior to surgery. Men may shave face and neck.  Do not bring valuables to the hospital.  [facility] is not responsible for any belongings or valuables.  Contacts, dentures or bridgework may not be worn into surgery.  Leave suitcase in the car. After surgery it may be brought to your room.  For patients admitted to the hospital, checkout time is 11:00 AM the day of discharge.   Special Instructions: Shower using CHG 2 nights before surgery and the night before surgery.  If you shower the day of surgery use CHG.  Use special wash - you have one bottle of CHG for all showers.  You should use approximately 1/3 of the bottle for each shower.   Please read over the following fact sheets that you were given: Pain Booklet, Coughing and Deep Breathing, Blood Transfusion Information, Open Heart Packet, MRSA Information and Surgical Site Infection Prevention

## 2012-09-06 ENCOUNTER — Encounter (HOSPITAL_COMMUNITY): Payer: Self-pay | Admitting: Certified Registered"

## 2012-09-06 LAB — HEMOGLOBIN A1C
Hgb A1c MFr Bld: 5.4 % (ref <5.7)
Mean Plasma Glucose: 108 mg/dL (ref <117)

## 2012-09-06 MED ORDER — NITROGLYCERIN IN D5W 200-5 MCG/ML-% IV SOLN
2.0000 ug/min | INTRAVENOUS | Status: AC
  Administered 2012-09-07: 16.7 ug/min via INTRAVENOUS
  Filled 2012-09-06: qty 250

## 2012-09-06 MED ORDER — DEXTROSE 5 % IV SOLN
750.0000 mg | INTRAVENOUS | Status: DC
  Filled 2012-09-06: qty 750

## 2012-09-06 MED ORDER — SODIUM CHLORIDE 0.9 % IV SOLN
INTRAVENOUS | Status: DC
  Filled 2012-09-06: qty 30

## 2012-09-06 MED ORDER — INSULIN REGULAR HUMAN 100 UNIT/ML IJ SOLN
INTRAMUSCULAR | Status: AC
  Administered 2012-09-07: 1.7 [IU]/h via INTRAVENOUS
  Filled 2012-09-06: qty 1

## 2012-09-06 MED ORDER — DEXTROSE 5 % IV SOLN
1.5000 g | INTRAVENOUS | Status: AC
  Administered 2012-09-07: 1.5 g via INTRAVENOUS
  Administered 2012-09-07: .75 g via INTRAVENOUS
  Filled 2012-09-06 (×2): qty 1.5

## 2012-09-06 MED ORDER — DOPAMINE-DEXTROSE 3.2-5 MG/ML-% IV SOLN
2.0000 ug/kg/min | INTRAVENOUS | Status: AC
  Administered 2012-09-07: 2 ug/kg/min via INTRAVENOUS
  Filled 2012-09-06: qty 250

## 2012-09-06 MED ORDER — VANCOMYCIN HCL 10 G IV SOLR
1250.0000 mg | INTRAVENOUS | Status: AC
  Administered 2012-09-07: 1250 mg via INTRAVENOUS
  Filled 2012-09-06: qty 1250

## 2012-09-06 MED ORDER — DEXMEDETOMIDINE HCL IN NACL 400 MCG/100ML IV SOLN
0.1000 ug/kg/h | INTRAVENOUS | Status: AC
  Administered 2012-09-07: 0.3 ug/kg/h via INTRAVENOUS
  Filled 2012-09-06: qty 100

## 2012-09-06 MED ORDER — SODIUM CHLORIDE 0.9 % IV SOLN
INTRAVENOUS | Status: AC
  Administered 2012-09-07: 70 mL/h via INTRAVENOUS
  Filled 2012-09-06: qty 40

## 2012-09-06 MED ORDER — POTASSIUM CHLORIDE 2 MEQ/ML IV SOLN
80.0000 meq | INTRAVENOUS | Status: DC
  Filled 2012-09-06: qty 40

## 2012-09-06 MED ORDER — PLASMA-LYTE 148 IV SOLN
INTRAVENOUS | Status: AC
  Administered 2012-09-07: 08:00:00
  Filled 2012-09-06: qty 2.5

## 2012-09-06 MED ORDER — PHENYLEPHRINE HCL 10 MG/ML IJ SOLN
30.0000 ug/min | INTRAVENOUS | Status: AC
  Administered 2012-09-07: 25 ug/min via INTRAVENOUS
  Filled 2012-09-06: qty 2

## 2012-09-06 MED ORDER — EPINEPHRINE HCL 1 MG/ML IJ SOLN
0.5000 ug/min | INTRAVENOUS | Status: DC
  Filled 2012-09-06: qty 4

## 2012-09-06 MED ORDER — MAGNESIUM SULFATE 50 % IJ SOLN
40.0000 meq | INTRAMUSCULAR | Status: DC
  Filled 2012-09-06: qty 10

## 2012-09-07 ENCOUNTER — Encounter (HOSPITAL_COMMUNITY): Payer: Self-pay | Admitting: *Deleted

## 2012-09-07 ENCOUNTER — Encounter (HOSPITAL_COMMUNITY): Payer: Self-pay | Admitting: Vascular Surgery

## 2012-09-07 ENCOUNTER — Inpatient Hospital Stay (HOSPITAL_COMMUNITY): Payer: BC Managed Care – PPO | Admitting: Anesthesiology

## 2012-09-07 ENCOUNTER — Inpatient Hospital Stay (HOSPITAL_COMMUNITY): Payer: BC Managed Care – PPO

## 2012-09-07 ENCOUNTER — Inpatient Hospital Stay (HOSPITAL_COMMUNITY)
Admission: RE | Admit: 2012-09-07 | Discharge: 2012-09-12 | DRG: 545 | Disposition: A | Discharge status: Home / Self Care | Payer: BC Managed Care – PPO | Source: Ambulatory Visit | Attending: Surgery | Admitting: Surgery

## 2012-09-07 ENCOUNTER — Encounter (HOSPITAL_COMMUNITY)
Admission: RE | Disposition: A | Discharge status: Home / Self Care | Payer: Self-pay | Source: Ambulatory Visit | Attending: Surgery

## 2012-09-07 DIAGNOSIS — Z9889 Other specified postprocedural states: Secondary | ICD-10-CM

## 2012-09-07 DIAGNOSIS — Z01812 Encounter for preprocedural laboratory examination: Secondary | ICD-10-CM

## 2012-09-07 DIAGNOSIS — I080 Rheumatic disorders of both mitral and aortic valves: Principal | ICD-10-CM | POA: Diagnosis present

## 2012-09-07 DIAGNOSIS — R7309 Other abnormal glucose: Secondary | ICD-10-CM | POA: Diagnosis not present

## 2012-09-07 DIAGNOSIS — G510 Bell's palsy: Secondary | ICD-10-CM | POA: Diagnosis present

## 2012-09-07 DIAGNOSIS — I5021 Acute systolic (congestive) heart failure: Secondary | ICD-10-CM | POA: Diagnosis present

## 2012-09-07 DIAGNOSIS — Z8679 Personal history of other diseases of the circulatory system: Secondary | ICD-10-CM

## 2012-09-07 DIAGNOSIS — Z79899 Other long term (current) drug therapy: Secondary | ICD-10-CM

## 2012-09-07 DIAGNOSIS — I2789 Other specified pulmonary heart diseases: Secondary | ICD-10-CM | POA: Diagnosis present

## 2012-09-07 DIAGNOSIS — I4891 Unspecified atrial fibrillation: Secondary | ICD-10-CM

## 2012-09-07 DIAGNOSIS — I059 Rheumatic mitral valve disease, unspecified: Secondary | ICD-10-CM

## 2012-09-07 DIAGNOSIS — Q231 Congenital insufficiency of aortic valve: Secondary | ICD-10-CM

## 2012-09-07 DIAGNOSIS — E785 Hyperlipidemia, unspecified: Secondary | ICD-10-CM | POA: Diagnosis present

## 2012-09-07 DIAGNOSIS — I509 Heart failure, unspecified: Secondary | ICD-10-CM | POA: Diagnosis present

## 2012-09-07 DIAGNOSIS — R0602 Shortness of breath: Secondary | ICD-10-CM

## 2012-09-07 DIAGNOSIS — I079 Rheumatic tricuspid valve disease, unspecified: Secondary | ICD-10-CM | POA: Diagnosis present

## 2012-09-07 DIAGNOSIS — I10 Essential (primary) hypertension: Secondary | ICD-10-CM | POA: Diagnosis present

## 2012-09-07 DIAGNOSIS — D62 Acute posthemorrhagic anemia: Secondary | ICD-10-CM | POA: Diagnosis not present

## 2012-09-07 DIAGNOSIS — M1710 Unilateral primary osteoarthritis, unspecified knee: Secondary | ICD-10-CM | POA: Diagnosis present

## 2012-09-07 DIAGNOSIS — I440 Atrioventricular block, first degree: Secondary | ICD-10-CM | POA: Diagnosis present

## 2012-09-07 DIAGNOSIS — Z952 Presence of prosthetic heart valve: Secondary | ICD-10-CM

## 2012-09-07 DIAGNOSIS — Z7901 Long term (current) use of anticoagulants: Secondary | ICD-10-CM

## 2012-09-07 DIAGNOSIS — I359 Nonrheumatic aortic valve disorder, unspecified: Secondary | ICD-10-CM

## 2012-09-07 HISTORY — PX: MITRAL VALVE REPAIR: SHX2039

## 2012-09-07 HISTORY — PX: CLIPPING OF ATRIAL APPENDAGE: SHX5773

## 2012-09-07 HISTORY — PX: MAZE: SHX5063

## 2012-09-07 HISTORY — PX: AORTIC VALVE REPLACEMENT: SHX41

## 2012-09-07 HISTORY — PX: INTRAOPERATIVE TRANSESOPHAGEAL ECHOCARDIOGRAM: SHX5062

## 2012-09-07 LAB — POCT I-STAT 3, ART BLOOD GAS (G3+)
Acid-base deficit: 6 mmol/L — ABNORMAL HIGH (ref 0.0–2.0)
Bicarbonate: 23.3 mEq/L (ref 20.0–24.0)
Bicarbonate: 20.1 mEq/L (ref 20.0–24.0)
Bicarbonate: 20.1 mEq/L (ref 20.0–24.0)
O2 Saturation: 100 %
Patient temperature: 36.6
TCO2: 21 mmol/L (ref 0–100)
TCO2: 21 mmol/L (ref 0–100)
pCO2 arterial: 35.6 mmHg (ref 35.0–45.0)
pH, Arterial: 7.273 — ABNORMAL LOW (ref 7.350–7.450)
pH, Arterial: 7.358 (ref 7.350–7.450)
pO2, Arterial: 74 mmHg — ABNORMAL LOW (ref 80.0–100.0)
pO2, Arterial: 73 mmHg — ABNORMAL LOW (ref 80.0–100.0)
pO2, Arterial: 68 mmHg — ABNORMAL LOW (ref 80.0–100.0)

## 2012-09-07 LAB — POCT I-STAT, CHEM 8
BUN: 22 mg/dL (ref 6–23)
Calcium, Ion: 1.27 mmol/L — ABNORMAL HIGH (ref 1.12–1.23)
Chloride: 112 mEq/L (ref 96–112)
Glucose, Bld: 140 mg/dL — ABNORMAL HIGH (ref 70–99)
HCT: 47 % (ref 39.0–52.0)
Potassium: 3.6 mEq/L (ref 3.5–5.1)

## 2012-09-07 LAB — CBC
MCH: 27 pg (ref 26.0–34.0)
MCHC: 33.3 g/dL (ref 30.0–36.0)
MCV: 81.6 fL (ref 78.0–100.0)
MCV: 81 fL (ref 78.0–100.0)
Platelets: 141 10*3/uL — ABNORMAL LOW (ref 150–400)
Platelets: 144 10*3/uL — ABNORMAL LOW (ref 150–400)
RBC: 5.67 MIL/uL (ref 4.22–5.81)
RDW: 16.1 % — ABNORMAL HIGH (ref 11.5–15.5)
RDW: 16.1 % — ABNORMAL HIGH (ref 11.5–15.5)
WBC: 22.2 10*3/uL — ABNORMAL HIGH (ref 4.0–10.5)

## 2012-09-07 LAB — POCT I-STAT 4, (NA,K, GLUC, HGB,HCT)
Glucose, Bld: 117 mg/dL — ABNORMAL HIGH (ref 70–99)
Glucose, Bld: 118 mg/dL — ABNORMAL HIGH (ref 70–99)
Glucose, Bld: 131 mg/dL — ABNORMAL HIGH (ref 70–99)
Glucose, Bld: 117 mg/dL — ABNORMAL HIGH (ref 70–99)
HCT: 33 % — ABNORMAL LOW (ref 39.0–52.0)
HCT: 33 % — ABNORMAL LOW (ref 39.0–52.0)
HCT: 31 % — ABNORMAL LOW (ref 39.0–52.0)
HCT: 48 % (ref 39.0–52.0)
Hemoglobin: 13.9 g/dL (ref 13.0–17.0)
Potassium: 3.7 mEq/L (ref 3.5–5.1)
Potassium: 4.6 mEq/L (ref 3.5–5.1)
Potassium: 4.4 mEq/L (ref 3.5–5.1)
Sodium: 144 mEq/L (ref 135–145)

## 2012-09-07 LAB — POCT I-STAT GLUCOSE
Glucose, Bld: 176 mg/dL — ABNORMAL HIGH (ref 70–99)
Glucose, Bld: 188 mg/dL — ABNORMAL HIGH (ref 70–99)
Operator id: 3406

## 2012-09-07 LAB — HEMOGLOBIN AND HEMATOCRIT, BLOOD
HCT: 31.9 % — ABNORMAL LOW (ref 39.0–52.0)
Hemoglobin: 10.7 g/dL — ABNORMAL LOW (ref 13.0–17.0)

## 2012-09-07 LAB — APTT: aPTT: 35 seconds (ref 24–37)

## 2012-09-07 LAB — GLUCOSE, CAPILLARY
Glucose-Capillary: 103 mg/dL — ABNORMAL HIGH (ref 70–99)
Glucose-Capillary: 121 mg/dL — ABNORMAL HIGH (ref 70–99)

## 2012-09-07 LAB — CREATININE, SERUM: GFR calc Af Amer: 63 mL/min — ABNORMAL LOW (ref >90)

## 2012-09-07 LAB — MAGNESIUM: Magnesium: 2.9 mg/dL — ABNORMAL HIGH (ref 1.5–2.5)

## 2012-09-07 SURGERY — REPLACEMENT, AORTIC VALVE, OPEN
Anesthesia: General | Site: Chest | Wound class: Clean

## 2012-09-07 MED ORDER — PROTAMINE SULFATE 10 MG/ML IV SOLN
INTRAVENOUS | Status: DC | PRN
  Administered 2012-09-07: 270 mg via INTRAVENOUS

## 2012-09-07 MED ORDER — METOPROLOL TARTRATE 12.5 MG HALF TABLET
12.5000 mg | ORAL_TABLET | Freq: Two times a day (BID) | ORAL | Status: DC
  Administered 2012-09-08: 12.5 mg via ORAL
  Filled 2012-09-07 (×5): qty 1

## 2012-09-07 MED ORDER — SODIUM BICARBONATE 8.4 % IV SOLN
50.0000 meq | Freq: Once | INTRAVENOUS | Status: AC
  Administered 2012-09-07: 50 meq via INTRAVENOUS

## 2012-09-07 MED ORDER — ROCURONIUM BROMIDE 100 MG/10ML IV SOLN
INTRAVENOUS | Status: DC | PRN
  Administered 2012-09-07: 10 mg via INTRAVENOUS

## 2012-09-07 MED ORDER — DEXMEDETOMIDINE HCL IN NACL 200 MCG/50ML IV SOLN
0.1000 ug/kg/h | INTRAVENOUS | Status: DC
  Administered 2012-09-07: 0.2 ug/kg/h via INTRAVENOUS
  Filled 2012-09-07: qty 50

## 2012-09-07 MED ORDER — FAMOTIDINE IN NACL 20-0.9 MG/50ML-% IV SOLN
20.0000 mg | Freq: Two times a day (BID) | INTRAVENOUS | Status: AC
  Administered 2012-09-07: 20 mg via INTRAVENOUS

## 2012-09-07 MED ORDER — 0.9 % SODIUM CHLORIDE (POUR BTL) OPTIME
TOPICAL | Status: DC | PRN
  Administered 2012-09-07: 1000 mL

## 2012-09-07 MED ORDER — LACTATED RINGERS IV SOLN
INTRAVENOUS | Status: DC | PRN
  Administered 2012-09-07: 07:00:00 via INTRAVENOUS

## 2012-09-07 MED ORDER — METOPROLOL TARTRATE 12.5 MG HALF TABLET
12.5000 mg | ORAL_TABLET | Freq: Once | ORAL | Status: AC
  Administered 2012-09-07: 12.5 mg via ORAL
  Filled 2012-09-07: qty 1

## 2012-09-07 MED ORDER — SUFENTANIL CITRATE 50 MCG/ML IV SOLN
INTRAVENOUS | Status: DC | PRN
  Administered 2012-09-07 (×3): 10 ug via INTRAVENOUS
  Administered 2012-09-07: 20 ug via INTRAVENOUS
  Administered 2012-09-07 (×4): 10 ug via INTRAVENOUS

## 2012-09-07 MED ORDER — THROMBIN 20000 UNITS EX SOLR
CUTANEOUS | Status: AC
  Filled 2012-09-07: qty 20000

## 2012-09-07 MED ORDER — MORPHINE SULFATE 2 MG/ML IJ SOLN
2.0000 mg | INTRAMUSCULAR | Status: DC | PRN
  Administered 2012-09-08: 2 mg via INTRAVENOUS
  Administered 2012-09-09: 4 mg via INTRAVENOUS
  Filled 2012-09-07: qty 1
  Filled 2012-09-07: qty 2

## 2012-09-07 MED ORDER — VECURONIUM BROMIDE 10 MG IV SOLR
INTRAVENOUS | Status: DC | PRN
  Administered 2012-09-07: 5 mg via INTRAVENOUS
  Administered 2012-09-07: 3 mg via INTRAVENOUS
  Administered 2012-09-07: 5 mg via INTRAVENOUS
  Administered 2012-09-07: 2 mg via INTRAVENOUS
  Administered 2012-09-07: 5 mg via INTRAVENOUS

## 2012-09-07 MED ORDER — HEMOSTATIC AGENTS (NO CHARGE) OPTIME
TOPICAL | Status: DC | PRN
  Administered 2012-09-07: 1 via TOPICAL

## 2012-09-07 MED ORDER — POTASSIUM CHLORIDE 10 MEQ/50ML IV SOLN: 10.0000 meq | INTRAVENOUS | Status: AC

## 2012-09-07 MED ORDER — DOCUSATE SODIUM 100 MG PO CAPS
200.0000 mg | ORAL_CAPSULE | Freq: Every day | ORAL | Status: DC
  Administered 2012-09-08 – 2012-09-09 (×2): 200 mg via ORAL
  Filled 2012-09-07 (×2): qty 2

## 2012-09-07 MED ORDER — BISACODYL 10 MG RE SUPP: 10.0000 mg | Freq: Every day | RECTAL | Status: DC

## 2012-09-07 MED ORDER — SODIUM CHLORIDE 0.9 % IV SOLN: INTRAVENOUS | Status: DC

## 2012-09-07 MED ORDER — MIDAZOLAM HCL 2 MG/2ML IJ SOLN: 2.0000 mg | INTRAMUSCULAR | Status: DC | PRN

## 2012-09-07 MED ORDER — PROPOFOL 10 MG/ML IV BOLUS
INTRAVENOUS | Status: DC | PRN
  Administered 2012-09-07: 50 mg via INTRAVENOUS

## 2012-09-07 MED ORDER — ACETAMINOPHEN 160 MG/5ML PO SOLN
1000.0000 mg | Freq: Four times a day (QID) | ORAL | Status: DC
  Filled 2012-09-07: qty 40

## 2012-09-07 MED ORDER — ALBUMIN HUMAN 5 % IV SOLN: 250.0000 mL | INTRAVENOUS | Status: AC | PRN

## 2012-09-07 MED ORDER — OXYCODONE HCL 5 MG PO TABS
5.0000 mg | ORAL_TABLET | ORAL | Status: DC | PRN
  Administered 2012-09-07: 10 mg via ORAL
  Administered 2012-09-08: 5 mg via ORAL
  Administered 2012-09-08: 10 mg via ORAL
  Filled 2012-09-07: qty 2
  Filled 2012-09-07: qty 1
  Filled 2012-09-07: qty 2

## 2012-09-07 MED ORDER — MORPHINE SULFATE 2 MG/ML IJ SOLN
1.0000 mg | INTRAMUSCULAR | Status: AC | PRN
  Administered 2012-09-07 (×2): 2 mg via INTRAVENOUS
  Filled 2012-09-07 (×2): qty 1

## 2012-09-07 MED ORDER — SODIUM CHLORIDE 0.9 % IV SOLN
INTRAVENOUS | Status: DC
  Administered 2012-09-08: 4.1 [IU]/h via INTRAVENOUS
  Filled 2012-09-07 (×2): qty 1

## 2012-09-07 MED ORDER — CALCIUM CHLORIDE 10 % IV SOLN
INTRAVENOUS | Status: DC | PRN
  Administered 2012-09-07: 550 mg via INTRAVENOUS

## 2012-09-07 MED ORDER — SODIUM CHLORIDE 0.9 % IV SOLN: 250.0000 mL | INTRAVENOUS | Status: DC

## 2012-09-07 MED ORDER — METOCLOPRAMIDE HCL 5 MG/ML IJ SOLN
10.0000 mg | Freq: Four times a day (QID) | INTRAMUSCULAR | Status: AC
  Administered 2012-09-07 – 2012-09-08 (×4): 10 mg via INTRAVENOUS
  Filled 2012-09-07 (×4): qty 2

## 2012-09-07 MED ORDER — BISACODYL 5 MG PO TBEC
10.0000 mg | DELAYED_RELEASE_TABLET | Freq: Every day | ORAL | Status: DC
  Administered 2012-09-08 – 2012-09-09 (×2): 10 mg via ORAL
  Filled 2012-09-07 (×2): qty 2

## 2012-09-07 MED ORDER — SODIUM CHLORIDE 0.9 % IJ SOLN
3.0000 mL | Freq: Two times a day (BID) | INTRAMUSCULAR | Status: DC
  Administered 2012-09-08 – 2012-09-09 (×3): 3 mL via INTRAVENOUS

## 2012-09-07 MED ORDER — METOPROLOL TARTRATE 25 MG/10 ML ORAL SUSPENSION
12.5000 mg | Freq: Two times a day (BID) | ORAL | Status: DC
Start: 2012-09-07 — End: 2012-09-09
  Filled 2012-09-07 (×5): qty 5

## 2012-09-07 MED ORDER — ONDANSETRON HCL 4 MG/2ML IJ SOLN
4.0000 mg | Freq: Four times a day (QID) | INTRAMUSCULAR | Status: DC | PRN
  Administered 2012-09-08: 4 mg via INTRAVENOUS
  Filled 2012-09-07: qty 2

## 2012-09-07 MED ORDER — LACTATED RINGERS IV SOLN: 500.0000 mL | Freq: Once | INTRAVENOUS | Status: AC | PRN

## 2012-09-07 MED ORDER — SODIUM CHLORIDE 0.9 % IJ SOLN: 3.0000 mL | INTRAMUSCULAR | Status: DC | PRN

## 2012-09-07 MED ORDER — MILRINONE IN DEXTROSE 20 MG/100ML IV SOLN
0.2500 ug/kg/min | INTRAVENOUS | Status: AC
  Administered 2012-09-07: .3 ug/kg/min via INTRAVENOUS
  Filled 2012-09-07: qty 100

## 2012-09-07 MED ORDER — ACETAMINOPHEN 500 MG PO TABS
1000.0000 mg | ORAL_TABLET | Freq: Four times a day (QID) | ORAL | Status: DC
  Administered 2012-09-08 – 2012-09-09 (×6): 1000 mg via ORAL
  Filled 2012-09-07 (×10): qty 2

## 2012-09-07 MED ORDER — PHENYLEPHRINE HCL 10 MG/ML IJ SOLN
0.0000 ug/min | INTRAVENOUS | Status: DC
  Administered 2012-09-07 – 2012-09-08 (×2): 38 ug/min via INTRAVENOUS
  Filled 2012-09-07 (×3): qty 2

## 2012-09-07 MED ORDER — ASPIRIN EC 325 MG PO TBEC
325.0000 mg | DELAYED_RELEASE_TABLET | Freq: Every day | ORAL | Status: DC
  Administered 2012-09-08: 325 mg via ORAL
  Filled 2012-09-07 (×2): qty 1

## 2012-09-07 MED ORDER — SODIUM CHLORIDE 0.45 % IV SOLN
INTRAVENOUS | Status: DC
  Administered 2012-09-07: 20 mL/h via INTRAVENOUS

## 2012-09-07 MED ORDER — ACETAMINOPHEN 160 MG/5ML PO SOLN: 650.0000 mg | Freq: Once | ORAL | Status: AC

## 2012-09-07 MED ORDER — POTASSIUM CHLORIDE 10 MEQ/50ML IV SOLN
10.0000 meq | INTRAVENOUS | Status: AC | PRN
  Administered 2012-09-07 – 2012-09-08 (×3): 10 meq via INTRAVENOUS

## 2012-09-07 MED ORDER — DOPAMINE-DEXTROSE 3.2-5 MG/ML-% IV SOLN: 0.0000 ug/kg/min | INTRAVENOUS | Status: DC

## 2012-09-07 MED ORDER — HEPARIN SODIUM (PORCINE) 1000 UNIT/ML IJ SOLN
INTRAMUSCULAR | Status: DC | PRN
  Administered 2012-09-07: 27000 [IU] via INTRAVENOUS

## 2012-09-07 MED ORDER — VANCOMYCIN HCL IN DEXTROSE 1-5 GM/200ML-% IV SOLN
1000.0000 mg | Freq: Once | INTRAVENOUS | Status: AC
  Administered 2012-09-07: 1000 mg via INTRAVENOUS
  Filled 2012-09-07: qty 200

## 2012-09-07 MED ORDER — PANTOPRAZOLE SODIUM 40 MG PO TBEC
40.0000 mg | DELAYED_RELEASE_TABLET | Freq: Every day | ORAL | Status: DC
  Administered 2012-09-09: 40 mg via ORAL
  Filled 2012-09-07: qty 1

## 2012-09-07 MED ORDER — MILRINONE IN DEXTROSE 20 MG/100ML IV SOLN
0.3000 ug/kg/min | INTRAVENOUS | Status: DC
  Administered 2012-09-07: 0.3 ug/kg/min via INTRAVENOUS
  Filled 2012-09-07: qty 100

## 2012-09-07 MED ORDER — SODIUM CHLORIDE 0.9 % IV SOLN
0.5000 g/h | INTRAVENOUS | Status: DC
  Filled 2012-09-07: qty 20

## 2012-09-07 MED ORDER — METOPROLOL TARTRATE 1 MG/ML IV SOLN: 2.5000 mg | INTRAVENOUS | Status: DC | PRN

## 2012-09-07 MED ORDER — DEXMEDETOMIDINE HCL IN NACL 200 MCG/50ML IV SOLN
0.4000 ug/kg/h | INTRAVENOUS | Status: AC
  Filled 2012-09-07: qty 50

## 2012-09-07 MED ORDER — GLYCOPYRROLATE 0.2 MG/ML IJ SOLN
INTRAMUSCULAR | Status: DC | PRN
  Administered 2012-09-07 (×2): 0.2 mg via INTRAVENOUS

## 2012-09-07 MED ORDER — THROMBIN 20000 UNITS EX SOLR
OROMUCOSAL | Status: DC | PRN
  Administered 2012-09-07: 08:00:00 via TOPICAL

## 2012-09-07 MED ORDER — INSULIN REGULAR BOLUS VIA INFUSION
0.0000 [IU] | Freq: Three times a day (TID) | INTRAVENOUS | Status: DC
  Administered 2012-09-08: 3 [IU] via INTRAVENOUS
  Filled 2012-09-07: qty 10

## 2012-09-07 MED ORDER — MAGNESIUM SULFATE 40 MG/ML IJ SOLN
4.0000 g | Freq: Once | INTRAMUSCULAR | Status: AC
  Administered 2012-09-07: 4 g via INTRAVENOUS
  Filled 2012-09-07: qty 100

## 2012-09-07 MED ORDER — DEXTROSE 5 % IV SOLN
1.5000 g | Freq: Two times a day (BID) | INTRAVENOUS | Status: AC
  Administered 2012-09-07 – 2012-09-09 (×4): 1.5 g via INTRAVENOUS
  Filled 2012-09-07 (×4): qty 1.5

## 2012-09-07 MED ORDER — MIDAZOLAM HCL 5 MG/5ML IJ SOLN
INTRAMUSCULAR | Status: DC | PRN
  Administered 2012-09-07: 5 mg via INTRAVENOUS
  Administered 2012-09-07: 3 mg via INTRAVENOUS
  Administered 2012-09-07: 5 mg via INTRAVENOUS
  Administered 2012-09-07: 2 mg via INTRAVENOUS

## 2012-09-07 MED ORDER — LACTATED RINGERS IV SOLN: INTRAVENOUS | Status: DC

## 2012-09-07 MED ORDER — ACETAMINOPHEN 650 MG RE SUPP
650.0000 mg | Freq: Once | RECTAL | Status: AC
  Administered 2012-09-07: 650 mg via RECTAL

## 2012-09-07 MED ORDER — ALBUMIN HUMAN 5 % IV SOLN
INTRAVENOUS | Status: DC | PRN
  Administered 2012-09-07: 15:00:00 via INTRAVENOUS

## 2012-09-07 MED ORDER — ATORVASTATIN CALCIUM 20 MG PO TABS
20.0000 mg | ORAL_TABLET | Freq: Every day | ORAL | Status: DC
  Administered 2012-09-08 – 2012-09-11 (×4): 20 mg via ORAL
  Filled 2012-09-07 (×8): qty 1

## 2012-09-07 MED ORDER — ASPIRIN 81 MG PO CHEW: 324.0000 mg | CHEWABLE_TABLET | Freq: Every day | ORAL | Status: DC

## 2012-09-07 MED ORDER — LACTATED RINGERS IV SOLN
INTRAVENOUS | Status: DC | PRN
  Administered 2012-09-07 (×2): via INTRAVENOUS

## 2012-09-07 MED ORDER — NITROGLYCERIN IN D5W 200-5 MCG/ML-% IV SOLN: 0.0000 ug/min | INTRAVENOUS | Status: DC

## 2012-09-07 SURGICAL SUPPLY — 93 items
ADAPTER CARDIO PERF ANTE/RETRO (ADAPTER) ×3 IMPLANT
ATRICLIP EXCLUSION 35 STD HAND (Clip) ×3 IMPLANT
ATTRACTOMAT 16X20 MAGNETIC DRP (DRAPES) ×3 IMPLANT
BAG DECANTER FOR FLEXI CONT (MISCELLANEOUS) ×3 IMPLANT
BLADE STERNUM SYSTEM 6 (BLADE) ×3 IMPLANT
BLADE SURG 15 STRL LF DISP TIS (BLADE) ×2 IMPLANT
BLADE SURG 15 STRL SS (BLADE) ×1
CANISTER SUCTION 2500CC (MISCELLANEOUS) ×3 IMPLANT
CANN PRFSN 3/8X14X24FR PCFC (MISCELLANEOUS)
CANN PRFSN 3/8XCNCT ST RT ANG (MISCELLANEOUS) ×2
CANNULA GUNDRY RCSP 15FR (MISCELLANEOUS) ×3 IMPLANT
CANNULA PRFSN 3/8X14X24FR PCFC (MISCELLANEOUS) IMPLANT
CANNULA PRFSN 3/8XCNCT RT ANG (MISCELLANEOUS) ×2 IMPLANT
CANNULA VEN MTL TIP RT (MISCELLANEOUS) ×1
CANNULA VRC MALB SNGL STG 36FR (MISCELLANEOUS) ×2 IMPLANT
CARDIOBLATE CARDIAC ABLATION (MISCELLANEOUS)
CATH ROBINSON RED A/P 18FR (CATHETERS) ×12 IMPLANT
CATH THORACIC 36FR (CATHETERS) ×3 IMPLANT
CATH THORACIC 36FR RT ANG (CATHETERS) ×3 IMPLANT
CLAMP ISOLATOR SYNERGY LG (MISCELLANEOUS) ×3 IMPLANT
CLOTH BEACON ORANGE TIMEOUT ST (SAFETY) ×3 IMPLANT
CONN 1/2X1/2X1/2  BEN (MISCELLANEOUS) ×1
CONN 1/2X1/2X1/2 BEN (MISCELLANEOUS) ×2 IMPLANT
CONN 3/8X1/2 ST GISH (MISCELLANEOUS) ×6 IMPLANT
CONT SPEC STER OR (MISCELLANEOUS) ×6 IMPLANT
COVER MAYO STAND STRL (DRAPES) ×3 IMPLANT
COVER SURGICAL LIGHT HANDLE (MISCELLANEOUS) ×3 IMPLANT
CRADLE DONUT ADULT HEAD (MISCELLANEOUS) IMPLANT
DEVICE CARDIOBLATE CARDIAC ABL (MISCELLANEOUS) IMPLANT
DRAPE CARDIOVASCULAR INCISE (DRAPES) ×1
DRAPE SLUSH/WARMER DISC (DRAPES) ×3 IMPLANT
DRAPE SRG 135X102X78XABS (DRAPES) ×2 IMPLANT
DRSG COVADERM 4X14 (GAUZE/BANDAGES/DRESSINGS) ×3 IMPLANT
ELECT CAUTERY BLADE 6.4 (BLADE) ×3 IMPLANT
ELECT REM PT RETURN 9FT ADLT (ELECTROSURGICAL) ×6
ELECTRODE REM PT RTRN 9FT ADLT (ELECTROSURGICAL) ×4 IMPLANT
GLOVE BIO SURGEON STRL SZ 6 (GLOVE) ×24 IMPLANT
GLOVE BIO SURGEON STRL SZ 6.5 (GLOVE) IMPLANT
GLOVE BIO SURGEON STRL SZ7 (GLOVE) IMPLANT
GLOVE BIO SURGEON STRL SZ7.5 (GLOVE) IMPLANT
GLOVE EUDERMIC 7 POWDERFREE (GLOVE) ×6 IMPLANT
GOWN PREVENTION PLUS XLARGE (GOWN DISPOSABLE) ×3 IMPLANT
GOWN STRL NON-REIN LRG LVL3 (GOWN DISPOSABLE) ×15 IMPLANT
HEART VENT LT CURVED (MISCELLANEOUS) ×3 IMPLANT
HEMOSTAT POWDER SURGIFOAM 1G (HEMOSTASIS) ×9 IMPLANT
HEMOSTAT SURGICEL 2X14 (HEMOSTASIS) ×3 IMPLANT
INSERT FOGARTY XLG (MISCELLANEOUS) IMPLANT
KIT BASIN OR (CUSTOM PROCEDURE TRAY) ×3 IMPLANT
KIT CATH CPB BARTLE (MISCELLANEOUS) ×3 IMPLANT
KIT ROOM TURNOVER OR (KITS) ×3 IMPLANT
KIT SUCTION CATH 14FR (SUCTIONS) ×3 IMPLANT
LINE VENT (MISCELLANEOUS) ×3 IMPLANT
LOOP VESSEL SUPERMAXI WHITE (MISCELLANEOUS) ×3 IMPLANT
NS IRRIG 1000ML POUR BTL (IV SOLUTION) ×21 IMPLANT
PACK OPEN HEART (CUSTOM PROCEDURE TRAY) ×3 IMPLANT
PAD ARMBOARD 7.5X6 YLW CONV (MISCELLANEOUS) ×6 IMPLANT
PAD SHARPS MAGNETIC DISPOSAL (MISCELLANEOUS) ×3 IMPLANT
PROBE CRYO2-ABLATION MALLABLE (MISCELLANEOUS) ×3 IMPLANT
RING MITRAL MEMO 3D 30MM SMD30 (Prosthesis & Implant Heart) ×3 IMPLANT
SET CARDIOPLEGIA MPS 5001102 (MISCELLANEOUS) ×3 IMPLANT
SPONGE GAUZE 4X4 12PLY (GAUZE/BANDAGES/DRESSINGS) ×3 IMPLANT
SPONGE LAP 18X18 X RAY DECT (DISPOSABLE) ×3 IMPLANT
SUCKER INTRACARDIAC WEIGHTED (SUCKER) ×3 IMPLANT
SUCKER WEIGHTED FLEX (MISCELLANEOUS) ×3 IMPLANT
SUT BONE WAX W31G (SUTURE) ×3 IMPLANT
SUT ETHIBON 2 0 V 52N 30 (SUTURE) ×6 IMPLANT
SUT ETHIBOND 2 0 SH (SUTURE) ×7 IMPLANT
SUT ETHIBOND 2 0 SH 36X2 (SUTURE) ×2 IMPLANT
SUT ETHIBOND 2 0 V4 (SUTURE) IMPLANT
SUT ETHIBOND 2 0V4 GREEN (SUTURE) IMPLANT
SUT PROLENE 3 0 SH 1 (SUTURE) ×3 IMPLANT
SUT PROLENE 3 0 SH DA (SUTURE) IMPLANT
SUT PROLENE 3 0 SH1 36 (SUTURE) ×9 IMPLANT
SUT PROLENE 4 0 RB 1 (SUTURE) ×5
SUT PROLENE 4 0 SH DA (SUTURE) IMPLANT
SUT PROLENE 4-0 RB1 .5 CRCL 36 (SUTURE) ×10 IMPLANT
SUT SILK 2 0 SH CR/8 (SUTURE) ×3 IMPLANT
SUT STEEL 6MS V (SUTURE) IMPLANT
SUT STEEL SZ 6 DBL 3X14 BALL (SUTURE) ×3 IMPLANT
SUT VIC AB 1 CTX 27 (SUTURE) ×6 IMPLANT
SUT VIC AB 1 CTX 36 (SUTURE) ×2
SUT VIC AB 1 CTX36XBRD ANBCTR (SUTURE) ×4 IMPLANT
SUTURE E-PAK OPEN HEART (SUTURE) ×3 IMPLANT
SYS ATRICLIP LAA EXCLUSION 45 (CLIP) IMPLANT
SYSTEM SAHARA CHEST DRAIN ATS (WOUND CARE) ×3 IMPLANT
TOWEL OR 17X24 6PK STRL BLUE (TOWEL DISPOSABLE) ×6 IMPLANT
TOWEL OR 17X26 10 PK STRL BLUE (TOWEL DISPOSABLE) ×6 IMPLANT
TRAY FOLEY IC TEMP SENS 14FR (CATHETERS) ×3 IMPLANT
TUBE SUCT INTRACARD DLP 20F (MISCELLANEOUS) ×3 IMPLANT
UNDERPAD 30X30 INCONTINENT (UNDERPADS AND DIAPERS) ×3 IMPLANT
VALVE MAGNA EASE AORTIC 25MM (Prosthesis & Implant Heart) ×3 IMPLANT
VRC MALLEABLE SINGLE STG 36FR (MISCELLANEOUS) ×3
WATER STERILE IRR 1000ML POUR (IV SOLUTION) ×6 IMPLANT

## 2012-09-07 NOTE — Progress Notes (Signed)
  Echocardiogram Echocardiogram Transesophageal has been performed.  Reginald Myers 09/07/2012, 9:42 AM

## 2012-09-07 NOTE — Anesthesia Postprocedure Evaluation (Signed)
  Anesthesia Post-op Note  Patient: Reginald Myers  Procedure(s) Performed: Procedure(s): AORTIC VALVE REPLACEMENT (AVR) (N/A) INTRAOPERATIVE TRANSESOPHAGEAL ECHOCARDIOGRAM (N/A) MITRAL VALVE REPAIR (MVR) (N/A) MAZE (N/A) CLIPPING OF ATRIAL APPENDAGE (N/A)  Patient Location: PACU and SICU  Anesthesia Type:General  Level of Consciousness: sedated and Patient remains intubated per anesthesia plan  Airway and Oxygen Therapy: Patient remains intubated per anesthesia plan  Post-op Pain: none  Post-op Assessment: Post-op Vital signs reviewed, Patient's Cardiovascular Status Stable and Respiratory Function Stable  Post-op Vital Signs: Reviewed and stable  Complications: No apparent anesthesia complications

## 2012-09-07 NOTE — Progress Notes (Signed)
UR Chart Review Completed  

## 2012-09-07 NOTE — OR Nursing (Signed)
Volunteer called @ 1354 to notify patient off pump

## 2012-09-07 NOTE — Transfer of Care (Signed)
Immediate Anesthesia Transfer of Care Note  Patient: Reginald Myers  Procedure(s) Performed: Procedure(s): AORTIC VALVE REPLACEMENT (AVR) (N/A) INTRAOPERATIVE TRANSESOPHAGEAL ECHOCARDIOGRAM (N/A) MITRAL VALVE REPAIR (MVR) (N/A) MAZE (N/A) CLIPPING OF ATRIAL APPENDAGE (N/A)  Patient Location: PACU and SICU  Anesthesia Type:General  Level of Consciousness: sedated and Patient remains intubated per anesthesia plan  Airway & Oxygen Therapy: Patient remains intubated per anesthesia plan and Patient placed on Ventilator (see vital sign flow sheet for setting)  Post-op Assessment: Report given to PACU RN  Post vital signs: Reviewed and stable  Complications: No apparent anesthesia complications

## 2012-09-07 NOTE — Brief Op Note (Signed)
09/07/2012  1:31 PM  PATIENT:  Reginald Myers  57 y.o. male  PRE-OPERATIVE DIAGNOSIS:  Severe aortic insufficiency, moderate aortic stenosis, atrial fibrillation, mitral regurgitation, tricuspid regurgitation.   POST-OPERATIVE DIAGNOSIS:  Severe aortic insufficiency, moderate aortic stenosis, atrial fibrillation, mitral regurgitation.   PROCEDURE:  Procedure(s):  AORTIC VALVE REPLACEMENT  -25 mm Edwards Magna Ease Bovine Pericardial Tissue Valve  MITRAL VALVE REPAIR (Ring Annuloplasty) -30 mm Sorin Memo 3D Ring  Complete MAZE Procedure -Bi Atrial Lesion set with Radio frequency Ablation and Cryo Thermy -Clipping of Left Atrial Appendage  INTRAOPERATIVE TRANSESOPHAGEAL ECHOCARDIOGRAM (N/A) SURGEON:  Surgeon(s) and Role:    * Alleen Borne, MD - Primary  PHYSICIAN ASSISTANT: Erin Barrett PA-C  ANESTHESIA:   general  EBL:  Total I/O In: 1000 [I.V.:1000] Out: 2000 [Urine:2000]  BLOOD ADMINISTERED: CELLSAVER and PLTS  DRAINS: Mediastinal chest drains, right pleural chest tube   LOCAL MEDICATIONS USED:  NONE  SPECIMEN:  Source of Specimen:  Aortic Valve Leaflets  DISPOSITION OF SPECIMEN:  PATHOLOGY  COUNTS:  YES  TOURNIQUET:  * No tourniquets in log *  DICTATION: .Dragon Dictation  PLAN OF CARE: Admit to inpatient   PATIENT DISPOSITION:  ICU - intubated and hemodynamically stable.   Delay start of Pharmacological VTE agent (>24hrs) due to surgical blood loss or risk of bleeding: yes

## 2012-09-07 NOTE — H&P (Signed)
[address]       [place] [address]             [phone]      [facility] Medical Record #[id number]  Date of Birth: [birth year]    Referring Physician: Everette Rank, MD  Pearla Dubonnet, MD   Chief Complaint: Severe aortic insufficiency   History of Present Illness:  The patient is a 57 year old gentleman in previously good health who was diagnosed with moderate to severe aortic insufficiency in December 2013 after his primary care physician heard a heart murmur. An echocardiogram at that time showed normal left ventricular function. He said he felt well. He was seen again last month by Dr. Eldridge Dace and reported that he had not felt well for about 1 month with exertional fatigue. He is not able to do his regular exercise routine. He was noted to be tachycardiac and was found to be in atrial fibrillation with rapid ventricular response. A followup echocardiogram in May of 2014 showed a significant decrease in his left ventricular function. An attempt was made at rate control.The patient continued to have fatigue and shortness of breath. Cardioversion was attempted but the patient only maintained normal rhythm for a brief period of time. He was started on amiodarone but did not tolerate that with nausea. He had another unsuccessful attempt at cardioversion. He was started on metoprolol but felt poorly after the first dose and gained about 18 pounds over a couple weeks with marked swelling in his legs and abdomen with development of orthopnea. A repeat echocardiogram on 08/14/2012 showed left ventricular ejection fraction of 35-40% with diffuse hypokinesis. 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 There is moderate mitral regurgitation. There is moderate to severe tricuspid regurgitation with a mildly dilated right ventricle. Pulmonary systolic pressure was  moderately increased with a peak pressure estimated 66 mmHg. He underwent right and left heart catheterization showing no significant coronary disease. He had pulmonary hypertension with PA pressure of 56/22 with a wedge pressure of 22. Cardiac index was low at 1.86. Left ventricular ejection fraction was 35% with end-diastolic pressure of 21. His edema and dyspnea have resolved with diuresis and he is now able to lay flat.   Current Activity/ Functional Status:  Patient is independent with mobility/ambulation, transfers, ADL's, IADL's.   Past Medical History   Diagnosis  Date   .  Heart murmur    .  Dysrhythmia    .  Hypertension    .  Hyperlipidemia    .  Right knee DJD      prior meniscal surgery   .  Bell's palsy  09/2009     right-residual weakness with fatique   .  Hemorrhoids    .  Atrial fibrillation    .  Aortic valve disorders    .  Orthopnea    .  Testicular cancer  1994   .  Shortness of breath    .  Acute systolic heart failure     Past Surgical History   Procedure  Laterality  Date   .  Orchiectomy  Left  1993   .  Tee without cardioversion  N/A  07/13/2012     Procedure: TRANSESOPHAGEAL ECHOCARDIOGRAM (TEE); Surgeon: Corky Crafts, MD; Location: [facility] ENDOSCOPY; Service: Cardiovascular; Laterality: N/A;   .  Cardioversion  N/A  07/13/2012     Procedure:  CARDIOVERSION; Surgeon: Corky Crafts, MD; Location: [facility] ENDOSCOPY; Service: Cardiovascular; Laterality: N/A;   .  Cardioversion  N/A  08/08/2012     Procedure: CARDIOVERSION; Surgeon: Corky Crafts, MD; Location: [facility] ENDOSCOPY; Service: Cardiovascular; Laterality: N/A;   .  Knee arthroscopy w/ meniscal repair  Right  1988    History   Smoking status   .  Never Smoker   Smokeless tobacco   .  Never Used    History   Alcohol Use   .  Yes     Comment: 77/2014 "glass of wine couple times a month"    History    Social History   .  Marital Status:  Single     Spouse Name:  N/A     Number of  Children:  N/A   .  Years of Education:  N/A    Occupational History   .  Owns a wine shop    Social History Main Topics   .  Smoking status:  Never Smoker   .  Smokeless tobacco:  Never Used   .  Alcohol Use:  Yes      Comment: 77/2014 "glass of wine couple times a month"   .  Drug Use:  No   .  Sexually Active:  Yes    Other Topics  Concern   .  Not on file    Social History Narrative   .  No narrative on file    Allergies   Allergen  Reactions   .  Amiodarone      intolerance    Current Outpatient Prescriptions   Medication  Sig  Dispense  Refill   .  Coenzyme Q-10 100 MG capsule  Take 100 mg by mouth daily. Only taking 3 to 4 times per week     .  digoxin (LANOXIN) 0.25 MG tablet  Take 1 tablet (0.25 mg total) by mouth daily.  30 tablet  11   .  diltiazem (TIAZAC) 180 MG 24 hr capsule  Take 2 capsules (360 mg total) by mouth daily.     .  fish oil-omega-3 fatty acids 1000 MG capsule  Take 2 g by mouth daily.     .  furosemide (LASIX) 40 MG tablet  Take 1 tablet (40 mg total) by mouth 2 (two) times daily.  30 tablet    .  Multiple Vitamin (MULTIVITAMIN WITH MINERALS) TABS  Take 1 tablet by mouth daily.     .  Red Yeast Rice 600 MG CAPS  Take 600 mg by mouth daily.     .  Rivaroxaban (XARELTO) 20 MG TABS  Take 20 mg by mouth daily.     .  rosuvastatin (CRESTOR) 10 MG tablet  Take 10 mg by mouth 3 (three) times a week.      No current facility-administered medications for this visit.    Family history: No family history of aortic valve disease   Review of Systems:  Cardiac Review of Systems: Y or N  Chest Pain [ n ] Resting SOB Milo.Brash ] Exertional SOB [ y ] Pollyann [place].Etienne ]  Pedal Edema [place].Etienne ] Palpitations [ y ] Syncope Milo.Brash ] Presyncope [n ]  General Review of Systems: [Y] = yes [ ] =no  Constitional: recent weight change [y]; anorexia [ n ]; fatigue [y]; nausea [n ]; night sweats [n ]; fever [n ]; or chills [ n ];  Dental: poor dentition[n ]; Last Dentist visit: within the past  year  Eye : blurred vision [n ]; diplopia Milo.Brash ]; vision changes [ n ]; Amaurosis fugax[ n];  Resp: cough [n ]; wheezing[ n ]; hemoptysis[ n ]; shortness of breath[ y ]; paroxysmal nocturnal dyspnea[ n ]; dyspnea on exertion[y]; or orthopnea[ y ];  GI: gallstones[ n], vomiting[ n ]; dysphagia[n]; melena[ n ]; hematochezia [n ]; heartburn[ n ]; Hx of Colonoscopy[ n];  GU: kidney stones [n ]; hematuria[n ]; dysuria [n ]; nocturia[ n]; history of obstruction [n ];  Skin: rash, swelling[ n ];, hair loss[n]; peripheral edema[n]; or itching[ n ];  Musculosketetal: myalgias[ n ]; joint swelling[ n ]; joint erythema[ n ];  joint pain[ n ]; back pain[ n ];  Heme/Lymph: bruising[ n ]; bleeding[ n; anemia[ n ];  Neuro: TIA[ n ]; headaches[ n]; stroke[ n]; vertigo[ n ]; seizures[ n ]; paresthesias[ n]; difficulty walking[n ];  Psych:depression[ n]; anxiety[ n ];  Endocrine: diabetes[ n thyroid dysfunction[ n ]  Immunizations: Flu [ n ]; Pneumococcal[ n ];   Other:   Physical Exam  General appearance: He is a well-developed thin white male in no distress.  HEENT: Normocephalic and atraumatic. Pupils are equal and reactive to light and accommodation. Extraocular muscles are intact. Throat is clear. Teeth are in good condition.  Neck: There is no JVD. Carotid pulses are palpable with no bruits. There is no adenopathy.  Lungs: Clear  Heart: Irregular rate and rhythm with a 3/6 diastolic AI murmur. There is a 1/6 systolic murmur at apex.  Abdomen: Bowel sounds are present. Soft and nontender. There are no palpable masses organomegaly.  Extremities: Mild ankle edema bilaterally. Pedal pulses are palpable bilaterally.  Neurological: Alert and oriented x3. Motor and sensory exams are grossly normal.   Diagnostic Studies & Laboratory data:  *[place]* *[facility]* [address] [place], [place]  [address] [phone]  ------------------------------------------------------------ Transthoracic Echocardiography  Patient: Albertus, Chiarelli MR #: [id number] Study Date: 08/14/2012 Gender: M Age: 60 Height: 185.4cm Weight: 98kg BSA: 2.20m^2 Pt. Status: Room: 4742  PERFORMING [facility] SONOGRAPHER Silvano Bilis, RCS ADMITTING [place], [place] ATTENDING [place], Renelda [place], [place] cc:  ------------------------------------------------------------ LV EF: 35% - 40%  ------------------------------------------------------------ Indications: Atrial fibrillation - 427.31. Aortic insufficiency 424.1.  ------------------------------------------------------------ History: PMH: Aortic valve disorder. Dyspnea.  ------------------------------------------------------------ Study Conclusions  - Left ventricle: The cavity size was normal. Systolic function was moderately reduced. The estimated ejection fraction was in the range of 35% to 40%. Diffuse hypokinesis. - Aortic valve: Functionally bicuspid; severely calcified leaflets. Moderate to severe regurgitation. Valve area: 1.3cm^2(VTI). Valve area: 1.54cm^2 (Vmax). - Mitral valve: Moderate regurgitation. - Left atrium: The atrium was mildly dilated. - Right ventricle: The cavity size was mildly dilated. Wall thickness was normal. - Right atrium: The atrium was moderately dilated. - Tricuspid valve: Moderate-severe regurgitation. - Pulmonary arteries: Systolic pressure was moderately increased. PA peak pressure: 66mm Hg (S). Transthoracic echocardiography. M-mode, complete 2D, spectral Doppler, and color Doppler. Height: Height: 185.4cm. Height: 73in. Weight: Weight: 98kg. Weight: 215.6lb. Body mass index: BMI: 28.5kg/m^2. Body surface area: BSA: 2.76m^2. Blood pressure: 89/42. Patient status: Inpatient. Location: Echo  laboratory.  ------------------------------------------------------------  ------------------------------------------------------------ Left ventricle: The cavity size was normal. Systolic function was moderately reduced. The estimated ejection fraction was in the range of 35% to 40%. Diffuse hypokinesis.  ------------------------------------------------------------ Aortic valve: Functionally bicuspid; severely calcified leaflets. Mobility was not restricted. Doppler: Transvalvular velocity was within the normal range. There was no stenosis. Moderate to severe regurgitation.  Valve area: 1.3cm^2(VTI). Indexed valve area: 0.57cm^2/m^2 (VTI). Peak velocity ratio of LVOT to aortic valve: 0.23. Valve area: 1.54cm^2 (Vmax). Indexed valve area: 0.68cm^2/m^2 (Vmax). Mean gradient: 26mm Hg (S). Peak gradient: 39mm Hg (S).  ------------------------------------------------------------ Aorta: Aortic root: The aortic root was normal in size.  ------------------------------------------------------------ Mitral valve: Mildly thickened leaflets . Mobility was not restricted. Doppler: Transvalvular velocity was within the normal range. There was no evidence for stenosis. Moderate regurgitation.  ------------------------------------------------------------ Left atrium: The atrium was mildly dilated.  ------------------------------------------------------------ Atrial septum: Poorly visualized.  ------------------------------------------------------------ Right ventricle: The cavity size was mildly dilated. Wall thickness was normal. Systolic function was low normal.  ------------------------------------------------------------ Pulmonic valve: Structurally normal valve. Doppler: Transvalvular velocity was within the normal range. There was no evidence for stenosis. Mild regurgitation.  ------------------------------------------------------------ Tricuspid valve: Mildly thickened leaflets.  Doppler: Transvalvular velocity was within the normal range. Moderate-severe regurgitation.  ------------------------------------------------------------ Pulmonary artery: Poorly visualized. Systolic pressure was moderately increased.  ------------------------------------------------------------ Right atrium: The atrium was moderately dilated.  ------------------------------------------------------------ Pericardium: There was no pericardial effusion.  ------------------------------------------------------------ Systemic veins: Inferior vena cava: The vessel was dilated; the respirophasic diameter changes were blunted (< 50%); findings are consistent with elevated central venous pressure.  ------------------------------------------------------------  2D measurements Normal Doppler measurements Norma Left ventricle l LVID ED, 60.3 mm 43-52 Main pulmonary artery chord, Pressure, 66 mm Hg =30 PLAX S LVID ES, 49.7 mm 23-38 Pressure 24 mm Hg ----- chord, ED PLAX LVOT FS, chord, 18 % >29 Peak vel, 72. cm/s ----- PLAX S 3 LVPW, ED 9.86 mm ------ Aortic valve IVS/LVPW 1.13 <1.3 Peak vel, 311 cm/s ----- ratio, ED S Ventricular septum Mean vel, 237 cm/s ----- IVS, ED 11.1 mm ------ S LVOT VTI, S 60. cm ----- Diam, S 29 mm ------ 9 Area 6.61 cm^2 ------ Mean 26 mm Hg ----- Aorta gradient, Root diam, 41 mm ------ S ED Peak 39 mm Hg ----- Left atrium gradient, AP dim 41 mm ------ S AP dim 1.81 cm/m^2 <2.2 Area, VTI 1.3 cm^2 ----- index Area index 0.5 cm^2/m^2 ----- Right ventricle (VTI) 7 RVID ED, 36.3 mm 19-38 Peak vel 0.2 ----- PLAX ratio, 3 LVOT/AV Area, Vmax 1.5 cm^2 ----- 4 Area index 0.6 cm^2/m^2 ----- (Vmax) 8 Regurg PHT 293 ms ----- Tricuspid valve Regurg 356 cm/s ----- peak vel Peak RV-RA 51 mm Hg ----- gradient, S Max regurg 356 cm/s ----- vel Systemic veins Estimated 15 mm Hg ----- CVP Right ventricle Pressure, 66 mm Hg <30 S Pulmonic valve Regurg  151 cm/s ----- vel, ED  ------------------------------------------------------------ Prepared and Electronically Authenticated by  Everette Rank, MD 2014-07-08T15:36:[id number]     Cardiac Cath:  HEMODYNAMICS: Aortic pressure was 90/54; LV pressure was 122/12; LVEDP 21. There was a moderate gradient between the left ventricle and aorta. RA 9/8 mm Hg; RV 56/8 mm Hg; PA 56/22, mean PA 35 mm Hg; PCWP 22/21 mm Hg. CO 4.6 L/min; CI 1.86. Aortic saturation 98%; pulmonary artery saturation 61%.  ANGIOGRAPHIC DATA: The left main coronary artery is long and patent.  The left anterior descending artery is a medium sized vessel which reaches the apex. The first diagonal is a large vessel and is widely patent. THere is mild disease in the LAD  The left circumflex artery is a large vessel proximally. There is a large first OM and a large atrial branch. The circumflex system is widely patent.  The right coronary artery is a large dominant vessel with minimal atherosclerosis. There is a large PDA and a large PLA  which are both widely patent.  LEFT VENTRICULOGRAM: Left ventricular angiogram was done in the 30 RAO projection and revealed normal left ventricular wall motion and systolic function with an estimated ejection fraction of 35%. LVEDP was 21 mmHg.  IMPRESSIONS:  1. Normal left main coronary artery. 2. Mild atherosclerosis in the left anterior descending artery and its branches. 3. Normal left circumflex artery and its branches. 4. Mild atherosclerosis in the right coronary artery system. 5. Decreased left ventricular systolic function. LVEDP . Ejection fraction 35%. 6. Moderate pulmonary hypertension, likely due to severe insufficiency. Decreased cardiac index as well, likely related to valvular heart disease.    Recent Radiology Findings:  No results found.  Recent Lab Findings:  Lab Results   Component  Value  Date    WBC  5.1  08/15/2012    HGB  13.1  08/15/2012    HCT  40.5  08/15/2012    PLT   193  08/15/2012    GLUCOSE  84  08/16/2012    ALT  29  08/13/2012    AST  31  08/13/2012    NA  139  08/16/2012    K  4.2  08/16/2012    CL  103  08/16/2012    CREATININE  1.57*  08/16/2012    BUN  29*  08/16/2012    CO2  21  08/16/2012    TSH  3.003  08/13/2012    INR  1.36  08/15/2012    Assessment / Plan:  He has moderate to severe aortic insufficiency diagnosed in December 2013 and now presents with progressive congestive heart failure symptoms with marked weight gain and edema, orthopnea, and atrial fibrillation with rapid ventricular response. Repeat echocardiogram continues to show moderate to severe aortic insufficiency but his left ventricular ejection fraction has markedly decreased and there is moderate mitral and tricuspid regurgitation. The mitral and tricuspid valves appear to have structurally normal leaflets but there appears to be annular dilatation due to ventricular enlargement. His atrial fibrillation was difficult to control and he would not maintain sinus rhythm after cardioversion. He did not tolerate amiodarone. I think that the best treatment would be to proceed with aortic valve replacement and mitral and tricuspid valve annuloplasty as well as a Maze procedure to try to maintain sinus rhythm. His mitral and tricuspid regurgitation looks significant and I think that if these valves are not treated he would likely have continued congestive heart failure symptoms. I discussed the operative recommendations with the patient and his wife in detail. We discussed the pros and cons of mechanical and tissue valves. He is 57 years old but is seriously thinking about using a tissue aortic valve to avoid long-term need for Coumadin. He understands that he may continue to have problems with atrial fibrillation and need to be on long-term Coumadin anyway. I told him that I thought the Maze procedure would have an 80% chance of maintaining sinus rhythm in his case. I discussed the operative procedure with  the patient and family including alternatives, benefits and risks; including but not limited to bleeding, blood transfusion, infection, stroke, myocardial infarction, graft failure, heart block requiring a permanent pacemaker, organ dysfunction, and death. Cheree Ditto understands and agrees to proceed.

## 2012-09-07 NOTE — Progress Notes (Signed)
Patient ID: Reginald Myers, male   DOB: 1955/04/17, 57 y.o.   MRN: [id number]  SICU Evening Rounds:  Hemodynamically stable on low dose dop and milrinone AV paced 90 On CPAP but still very sleepy. Good strength in all ext. CT output low UO good. CBC    Component Value Date/Time   WBC 22.2* 09/07/2012 1526   RBC 5.60 09/07/2012 1526   HGB 16.3 09/07/2012 1528   HCT 48.0 09/07/2012 1528   PLT 141* 09/07/2012 1526   MCV 81.6 09/07/2012 1526   MCH 27.0 09/07/2012 1526   MCHC 33.0 09/07/2012 1526   RDW 16.1* 09/07/2012 1526   LYMPHSABS 1.1 08/13/2012 1805   MONOABS 0.5 08/13/2012 1805   EOSABS 0.2 08/13/2012 1805   BASOSABS 0.0 08/13/2012 1805    BMET    Component Value Date/Time   NA 141 09/07/2012 1528   K 4.4 09/07/2012 1528   CL 103 09/05/2012 1038   CO2 21 09/05/2012 1038   GLUCOSE 117* 09/07/2012 1528   BUN 35* 09/05/2012 1038   CREATININE 1.67* 09/05/2012 1038   CALCIUM 9.6 09/05/2012 1038   GFRNONAA 45* 09/05/2012 1038   GFRAA 52* 09/05/2012 1038

## 2012-09-07 NOTE — Anesthesia Preprocedure Evaluation (Addendum)
Anesthesia Evaluation  Patient identified by MRN, date of birth, ID band Patient awake    Reviewed: Allergy & Precautions, H&P , NPO status , Patient's Chart, lab work & pertinent test results  Airway Mallampati: II TM Distance: >3 FB Neck ROM: Full    Dental  (+) Teeth Intact and Dental Advisory Given   Pulmonary shortness of breath and with exertion,  breath sounds clear to auscultation        Cardiovascular hypertension, Pt. on medications + Orthopnea + dysrhythmias Atrial Fibrillation + Valvular Problems/Murmurs AI and MR Rhythm:Regular Rate:Normal     Neuro/Psych    GI/Hepatic   Endo/Other    Renal/GU      Musculoskeletal   Abdominal   Peds  Hematology   Anesthesia Other Findings   Reproductive/Obstetrics                          Anesthesia Physical Anesthesia Plan  ASA: III  Anesthesia Plan: General   Post-op Pain Management:    Induction: Intravenous  Airway Management Planned: Oral ETT  Additional Equipment: Arterial line, CVP, PA Cath, 3D TEE and Ultrasound Guidance Line Placement  Intra-op Plan:   Post-operative Plan: Post-operative intubation/ventilation  Informed Consent: I have reviewed the patients History and Physical, chart, labs and discussed the procedure including the risks, benefits and alternatives for the proposed anesthesia with the patient or authorized representative who has indicated his/her understanding and acceptance.   Dental advisory given  Plan Discussed with: CRNA, Anesthesiologist and Surgeon  Anesthesia Plan Comments:         Anesthesia Quick Evaluation

## 2012-09-07 NOTE — OR Nursing (Signed)
SICU first call @ 1354

## 2012-09-07 NOTE — Procedures (Signed)
Extubation Procedure Note  Patient Details:   Name: Reginald Myers DOB: 1955-07-07 MRN: [id number]   Airway Documentation:   Patient extubated to 4 lpm nasal cannula.  VC 1000 ml, NIF -40, patient able to lift and hold head off bed.  Patient able to breathe around deflated cuff and vocalize post procedure.  No complications noted, tolerated well.   Evaluation  O2 sats: stable throughout Complications: No apparent complications Patient did tolerate procedure well. Bilateral Breath Sounds: Clear   Yes  Timotheus Salm, Aloha Gell 09/07/2012, 7:27 PM

## 2012-09-07 NOTE — Interval H&P Note (Signed)
History and Physical Interval Note:  09/07/2012 7:35 AM  Reginald Myers  has presented today for surgery, with the diagnosis of AS AI  The various methods of treatment have been discussed with the patient and family. After consideration of risks, benefits and other options for treatment, the patient has consented to  Procedure(s): AORTIC VALVE REPLACEMENT (AVR) (N/A) INTRAOPERATIVE TRANSESOPHAGEAL ECHOCARDIOGRAM (N/A) MITRAL VALVE REPAIR (MVR) (N/A) TRICUSPID VALVE REPAIR (N/A) MAZE (N/A) as a surgical intervention .  The patient's history has been reviewed, patient examined, no change in status, stable for surgery.  I have reviewed the patient's chart and labs.  Questions were answered to the patient's satisfaction.  He has decided that he would like to have a tissue aortic valve.   Alleen Borne

## 2012-09-07 NOTE — Preoperative (Signed)
Beta Blockers   Reason not to administer Beta Blockers:Not Applicable 

## 2012-09-07 NOTE — Anesthesia Postprocedure Evaluation (Signed)
  Anesthesia Post-op Note  Patient: Reginald Myers  Procedure(s) Performed: Procedure(s): AORTIC VALVE REPLACEMENT (AVR) (N/A) INTRAOPERATIVE TRANSESOPHAGEAL ECHOCARDIOGRAM (N/A) MITRAL VALVE REPAIR (MVR) (N/A) MAZE (N/A) CLIPPING OF ATRIAL APPENDAGE (N/A)  Patient Location: SICU  Anesthesia Type:General  Level of Consciousness: sedated and Patient remains intubated per anesthesia plan  Airway and Oxygen Therapy: Patient remains intubated per anesthesia plan and Patient placed on Ventilator (see vital sign flow sheet for setting)  Post-op Pain: none  Post-op Assessment: Post-op Vital signs reviewed and Patient's Cardiovascular Status Stable  Post-op Vital Signs: stable  Complications: No apparent anesthesia complications

## 2012-09-08 ENCOUNTER — Inpatient Hospital Stay (HOSPITAL_COMMUNITY): Payer: BC Managed Care – PPO

## 2012-09-08 LAB — CREATININE, SERUM
Creatinine, Ser: 1.51 mg/dL — ABNORMAL HIGH (ref 0.50–1.35)
GFR calc non Af Amer: 51 mL/min — ABNORMAL LOW (ref >90)

## 2012-09-08 LAB — PREPARE PLATELET PHERESIS: Unit division: 0

## 2012-09-08 LAB — GLUCOSE, CAPILLARY
Glucose-Capillary: 113 mg/dL — ABNORMAL HIGH (ref 70–99)
Glucose-Capillary: 125 mg/dL — ABNORMAL HIGH (ref 70–99)
Glucose-Capillary: 128 mg/dL — ABNORMAL HIGH (ref 70–99)
Glucose-Capillary: 133 mg/dL — ABNORMAL HIGH (ref 70–99)
Glucose-Capillary: 147 mg/dL — ABNORMAL HIGH (ref 70–99)
Glucose-Capillary: 125 mg/dL — ABNORMAL HIGH (ref 70–99)
Glucose-Capillary: 129 mg/dL — ABNORMAL HIGH (ref 70–99)
Glucose-Capillary: 100 mg/dL — ABNORMAL HIGH (ref 70–99)
Glucose-Capillary: 102 mg/dL — ABNORMAL HIGH (ref 70–99)
Glucose-Capillary: 108 mg/dL — ABNORMAL HIGH (ref 70–99)
Glucose-Capillary: 110 mg/dL — ABNORMAL HIGH (ref 70–99)
Glucose-Capillary: 104 mg/dL — ABNORMAL HIGH (ref 70–99)
Glucose-Capillary: 127 mg/dL — ABNORMAL HIGH (ref 70–99)

## 2012-09-08 LAB — CBC
HCT: 38.7 % — ABNORMAL LOW (ref 39.0–52.0)
Hemoglobin: 13 g/dL (ref 13.0–17.0)
MCH: 27.3 pg (ref 26.0–34.0)
MCV: 80.3 fL (ref 78.0–100.0)
Platelets: 127 10*3/uL — ABNORMAL LOW (ref 150–400)
RBC: 5.24 MIL/uL (ref 4.22–5.81)
RBC: 4.78 MIL/uL (ref 4.22–5.81)
RDW: 16.1 % — ABNORMAL HIGH (ref 11.5–15.5)

## 2012-09-08 LAB — POCT I-STAT, CHEM 8
BUN: 24 mg/dL — ABNORMAL HIGH (ref 6–23)
Calcium, Ion: 1.2 mmol/L (ref 1.12–1.23)
Creatinine, Ser: 1.7 mg/dL — ABNORMAL HIGH (ref 0.50–1.35)
TCO2: 22 mmol/L (ref 0–100)

## 2012-09-08 LAB — BASIC METABOLIC PANEL
CO2: 21 mEq/L (ref 19–32)
Calcium: 8.5 mg/dL (ref 8.4–10.5)
Creatinine, Ser: 1.55 mg/dL — ABNORMAL HIGH (ref 0.50–1.35)
GFR calc Af Amer: 57 mL/min — ABNORMAL LOW (ref >90)
GFR calc non Af Amer: 49 mL/min — ABNORMAL LOW (ref >90)
Sodium: 136 mEq/L (ref 135–145)

## 2012-09-08 LAB — MAGNESIUM
Magnesium: 2.6 mg/dL — ABNORMAL HIGH (ref 1.5–2.5)
Magnesium: 2.4 mg/dL (ref 1.5–2.5)

## 2012-09-08 MED ORDER — INSULIN ASPART 100 UNIT/ML ~~LOC~~ SOLN
0.0000 [IU] | SUBCUTANEOUS | Status: DC
  Administered 2012-09-08 – 2012-09-09 (×3): 2 [IU] via SUBCUTANEOUS

## 2012-09-08 MED ORDER — ENOXAPARIN SODIUM 30 MG/0.3ML ~~LOC~~ SOLN
30.0000 mg | Freq: Every day | SUBCUTANEOUS | Status: DC
  Administered 2012-09-08: 30 mg via SUBCUTANEOUS
  Filled 2012-09-08 (×2): qty 0.3

## 2012-09-08 MED ORDER — INSULIN DETEMIR 100 UNIT/ML ~~LOC~~ SOLN
20.0000 [IU] | Freq: Once | SUBCUTANEOUS | Status: AC
  Administered 2012-09-08: 20 [IU] via SUBCUTANEOUS
  Filled 2012-09-08: qty 0.2

## 2012-09-08 NOTE — Progress Notes (Signed)
Anesthesiology Follow-up:  Awake and alert, neuro intact, having some incisional soreness otherwise feels OK.   VS: T- 36.6 BP- 99/71 HR- 83 (narrow complex junctional vs SR) RR- 12 O2 Sat 97% on 2L  PA 42/25 CO/CI 4.6/2.1  Na -136 K- 4.5 BUN/Cr. 25/1.55 H/H- 14.3/43 Platelets- 127,000  CXR: bibasilar atelectasis overall good ling expansion without infiltrates  Milrinone 0.3 mcg/kg/min Dopamine 3 mcg/kg/min Neosynephrine 20 mcg/kg/min  Extubated at 1930 last night, four hours post-op.  57 year old male with severe AI, functional MR and atrial fibrillation. Underwent AVR with mitral ring and maze procedure, stable post-op course. He appears to be in junctional or SR, renal function at baseline.  Reginald Myers

## 2012-09-08 NOTE — Progress Notes (Addendum)
1 Day Post-Op Procedure(s) (LRB): AORTIC VALVE REPLACEMENT (AVR) (N/A) INTRAOPERATIVE TRANSESOPHAGEAL ECHOCARDIOGRAM (N/A) MITRAL VALVE REPAIR (MVR) (N/A) MAZE (N/A) CLIPPING OF ATRIAL APPENDAGE (N/A) Subjective:  No complaints.  Objective: Vital signs in last 24 hours: Temp:  [96.3 F (35.7 C)-98.4 F (36.9 C)] 97.9 F (36.6 C) (08/02 0700) Pulse Rate:  [89-91] 91 (08/02 0700) Cardiac Rhythm:  [-] A-V Sequential paced (08/01 2000) Resp:  [11-30] 12 (08/02 0700) BP: (82-115)/(57-90) 99/71 mmHg (08/02 0700) SpO2:  [94 %-100 %] 97 % (08/02 0700) Arterial Line BP: (82-112)/(58-75) 102/69 mmHg (08/02 0700) FiO2 (%):  [40 %-50 %] 40 % (08/01 1900) Weight:  [94 kg (207 lb 3.7 oz)] 94 kg (207 lb 3.7 oz) (08/02 0500)  Hemodynamic parameters for last 24 hours: PAP: (37-46)/(21-29) 42/25 mmHg CO:  [4.4 L/min-5.5 L/min] 4.6 L/min CI:  [2 L/min/m2-2.5 L/min/m2] 2.1 L/min/m2  Intake/Output from previous day: 08/01 0701 - 08/02 0700 In: 8628.7 [P.O.:680; I.V.:4365.7; Blood:2703; NG/GT:30; IV Piggyback:850] Out: 1610 [RUEAV:4098; Emesis/NG output:60; JXBJY:7829; Chest Tube:515] Intake/Output this shift:    General appearance: sleepy but appropriate Neurologic: intact Heart: regular rate and rhythm, S1, S2 normal, no murmur, click, rub or gallop Lungs: clear to auscultation bilaterally Extremities: edema mild Wound: dressing dry  Lab Results:  Recent Labs  09/07/12 2130 09/07/12 2138 09/08/12 0400  WBC 25.7*  --  25.3*  HGB 15.3 16.0 14.3  HCT 45.9 47.0 42.1  PLT 144*  --  127*   BMET:  Recent Labs  09/05/12 1038  09/07/12 2138 09/08/12 0400  NA 136  < > 138 136  K 4.2  < > 3.6 4.5  CL 103  --  112 106  CO2 21  --   --  21  GLUCOSE 99  < > 140* 135*  BUN 35*  --  22 25*  CREATININE 1.67*  < > 1.60* 1.55*  CALCIUM 9.6  --   --  8.5  < > = values in this interval not displayed.  PT/INR:  Recent Labs  09/07/12 1526  LABPROT 18.2*  INR 1.55*   ABG     Component Value Date/Time   PHART 7.307* 09/07/2012 2138   HCO3 20.1 09/07/2012 2138   TCO2 20 09/07/2012 2138   TCO2 21 09/07/2012 2138   ACIDBASEDEF 6.0* 09/07/2012 2138   O2SAT 92.0 09/07/2012 2138   CBG (last 3)   Recent Labs  09/08/12 0404 09/08/12 0509 09/08/12 0607  GLUCAP 125* 105* 137*   CXR:  Clear  ECG:  Sinus with 1st degree AV block  Assessment/Plan: S/P Procedure(s) (LRB): AORTIC VALVE REPLACEMENT (AVR) (N/A) INTRAOPERATIVE TRANSESOPHAGEAL ECHOCARDIOGRAM (N/A) MITRAL VALVE REPAIR (MVR) (N/A) MAZE (N/A) CLIPPING OF ATRIAL APPENDAGE (N/A)  He is hemodynamically stable. Wean milrinone and dopamine.  He is in sinus with first degree AV block with HR 80 following MAZE. Will start low dose B-blocker when off inotropes. He was on digoxin and cardizem for rate control of A-fib preop. Will need to decide on anticoagulation regimen. 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  DC chest tubes, swan  Hyperglycemia probably due to inotropes and surgical stress. He is not diabetic with Hgb A1c of 5.4. Will give some Levemir this am to get off insulin drip.  See progression orders.   LOS: 1 day    Kalisa Girtman K 09/08/2012

## 2012-09-08 NOTE — Progress Notes (Signed)
Patient ID: Reginald Myers, male   DOB: April 04, 1955, 57 y.o.   MRN: [id number]  SICU Evening Rounds:  Hemodynamically stable Sinus in 70's  Urine output ok  BMET    Component Value Date/Time   NA 137 09/08/2012 1638   K 4.6 09/08/2012 1638   CL 104 09/08/2012 1638   CO2 21 09/08/2012 0400   GLUCOSE 130* 09/08/2012 1638   BUN 24* 09/08/2012 1638   CREATININE 1.70* 09/08/2012 1638   CALCIUM 8.5 09/08/2012 0400   GFRNONAA 51* 09/08/2012 1630   GFRAA 59* 09/08/2012 1630

## 2012-09-08 NOTE — Op Note (Signed)
CARDIOVASCULAR SURGERY OPERATIVE NOTE  09/08/2012 Reginald Myers [id number]  Surgeon:  Alleen Borne, MD  First Assistant: Lowella Dandy,  [facility]   Preoperative Diagnosis:   Bicuspid aortic valve Severe aortic insufficiency Moderate aortic stenosis Moderate functional mitral insufficiency Trivial tricuspid insuffiency Marked LV cavity dilatation and moderate LV dysfunction Moderate pulmonary hypertension  Postoperative Diagnosis:  Same   Procedure:  1. Median Sternotomy 2. Extracorporeal circulation 3.   Aortic valve replacement using a 25 mm Edwards Pericardial Magna-ease valve. 4.   Mitral annuloplasty with a 30 mm Sorin Memo 3D ring 5.   Cox MAZE IV using bipolar radiofrequency and cryothermy 6.   Ligation of left atrial appendage using a 35 mm Atricure Atriclip  Anesthesia:  General Endotracheal   Clinical History/Surgical Indication:  The patient is a 57 year old gentleman in previously good health who was diagnosed with moderate to severe aortic insufficiency in December 2013 after his primary care physician heard a heart murmur. An echocardiogram at that time showed normal left ventricular function. He said he felt well. He was seen again last month by Dr. Eldridge Dace and reported that he had not felt well for about 1 month with exertional fatigue. He is not able to do his regular exercise routine. He was noted to be tachycardiac and was found to be in atrial fibrillation with rapid ventricular response. A followup echocardiogram in May of 2014 showed a significant decrease in his left ventricular function. An attempt was made at rate control.The patient continued to have fatigue and shortness of breath. Cardioversion was attempted but the patient only maintained normal rhythm for a brief period of time. He was started on amiodarone but did not tolerate that with nausea. He had another unsuccessful attempt at cardioversion. He was started on metoprolol but felt poorly after  the first dose and gained about 18 pounds over a couple weeks with marked swelling in his legs and abdomen with development of orthopnea. A repeat echocardiogram on 08/14/2012 showed left ventricular ejection fraction of 35-40% with diffuse hypokinesis. 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 There is moderate mitral regurgitation. There is moderate to severe tricuspid regurgitation with a mildly dilated right ventricle. Pulmonary systolic pressure was moderately increased with a peak pressure estimated 66 mmHg. He underwent right and left heart catheterization showing no significant coronary disease. He had pulmonary hypertension with PA pressure of 56/22 with a wedge pressure of 22. Cardiac index was low at 1.86. Left ventricular ejection fraction was 35% with end-diastolic pressure of 21. His edema and dyspnea have resolved with diuresis and he is now able to lay flat. He has moderate to severe aortic insufficiency diagnosed in December 2013 and now presents with progressive congestive heart failure symptoms with marked weight gain and edema, orthopnea, and atrial fibrillation with rapid ventricular response. Repeat echocardiogram continues to show moderate to severe aortic insufficiency but his left ventricular ejection fraction has markedly decreased and there is moderate mitral and tricuspid regurgitation. The mitral and tricuspid valves appear to have structurally normal leaflets but there appears to be annular dilatation due to ventricular enlargement. His atrial fibrillation was difficult to control and he would not maintain sinus rhythm after cardioversion. He did not tolerate amiodarone. I think that the best treatment would be to proceed with aortic valve replacement and mitral and tricuspid valve annuloplasty as well as a Maze procedure to try to maintain sinus rhythm. His mitral and tricuspid regurgitation looked  significant and I think that if these valves are not treated he would likely have continued congestive heart failure symptoms. I discussed the operative recommendations with the patient and his wife in detail. We discussed the pros and cons of mechanical and tissue valves. He is 57 years old but would like to use a tissue aortic valve to avoid long-term need for Coumadin. He understands that he may continue to have problems with atrial fibrillation and need to be on long-term Coumadin or another anticoagulant anyway. I told him that I thought the Maze procedure would have an 80% chance of maintaining sinus rhythm in his case. I discussed the operative procedure with the patient and family including alternatives, benefits and risks; including but not limited to bleeding, blood transfusion, infection, stroke, myocardial infarction, graft failure, heart block requiring a permanent pacemaker, organ dysfunction, and death. Cheree Ditto understands and agrees to proceed.    Preparation:  The patient was seen in the preoperative holding area and the correct patient, correct operation were confirmed with the patient after reviewing the medical record and catheterization. The consent was signed by me. Preoperative antibiotics were given. A pulmonary arterial line and radial arterial line were placed by the anesthesia team. The patient was taken back to the operating room and positioned supine on the operating room table. After being placed under general endotracheal anesthesia by the anesthesia team a foley catheter was placed. The neck, chest, abdomen, and both legs were prepped with betadine soap and solution and draped in the usual sterile manner. A surgical time-out was taken and the correct patient and operative procedure were confirmed with the nursing and anesthesia staff.   Pre-bypass TEE and Post-bypass TEE:   Complete TEE assessment was performed by Dr. Kipp Brood as noted below.   Transesophageal  Echocardiography  Patient:    Reginald Myers, Reginald Myers MR #:       [id number] Study Date: 09/07/2012 Gender:     M Age:        57 Height: Weight:     91.4kg BSA: Pt. Status: Room:       [facility]    ADMITTING    Freda Jackson, Rodena Goldmann, Elainah Rhyne  REFERRING    Evelene Croon  SONOGRAPHER  Perley Jain, RDCS  PERFORMING   Kipp Brood cc:  ------------------------------------------------------------ LV EF: 35% -   40%  ------------------------------------------------------------ Indications:      424.1 Aortic valve disorders.  ------------------------------------------------------------ History:   PMH:   Dyspnea.  Atrial fibrillation.  ------------------------------------------------------------ Study Conclusions  - Left ventricle: Systolic function was moderately reduced.   The estimated ejection fraction was in the range of 35% to   40%. Diffuse hypokinesis. - Aortic valve: Valve area: 1.88cm^2(VTI). Valve area:   1.5cm^2 (Vmax). Valve area: 1.55cm^2 (Vmean). - Mitral valve: Moderately dilated annulus. Normal thickness   leaflets . Mobility was restricted. Mild to moderate   regurgitation directed centrally. Valve area by continuity   equation (using LVOT flow): 3.69cm^2. - Left atrium: The atrium was moderately to severely   dilated. No evidence of thrombus in the atrial cavity or   appendage. No spontaneous echo contrast was observed. - Right ventricle: Systolic function was mildly to   moderately reduced. Cannot exclude hypokinesis of the RV. - Right atrium: The atrium was moderately dilated. - Atrial septum: No defect or patent foramen ovale was   identified. Echo contrast study showed no right-to-left  atrial level shunt, following an increase in RA pressure   induced by provocative maneuvers. Transesophageal echocardiography.  2D and color Doppler. Weight:  Weight: 91.4kg. Weight: 201lb.  Blood  pressure: 129/92.  ------------------------------------------------------------  ------------------------------------------------------------ Left ventricle:  Systolic function was moderately reduced. The estimated ejection fraction was in the range of 35% to 40%. Diffuse hypokinesis.  ------------------------------------------------------------ Aortic valve:  Bileaflet aortic valve with a raphe between the right nad left coronary cusps. There was moderate aortic stenosis and moderate to severe aortic insufficiency. There was severe leaflet calcification especially along the raphe between the RCC and LCC.  Aortic annulus: 2.8 cm Peak aortic velocity: 3.65 m/sec. Mean transaortic gradient: 34 mm hg AVA VTI: 1.22 cm2 AVA VMax: 1.14 CM2 AI Jet PHT: 288 ms AI jet deceleration slope: 353 cm/s  Doppler:     VTI ratio of LVOT to aortic valve: 0.29. Valve area: 1.88cm^2(VTI). Peak velocity ratio of LVOT to aortic valve: 0.23. Valve area: 1.5cm^2 (Vmax). Mean velocity ratio of LVOT to aortic valve: 0.23. Valve area: 1.55cm^2 (Vmean).    Mean gradient: 23mm Hg (S). Peak gradient: 35mm Hg (S).   ------------------------------------------------------------ Aorta:  Aortic was of normal caliber with no atheromatous disaese appreciated. well defined aortic root and ST ridge without effacemant or enlargenment. The aorta was normal, not dilated, and non-diseased.  ------------------------------------------------------------ Mitral valve:  There was mild to moderate mitral regugitation due to a dilated annulus with laterally displaced papillary muscles. There was a central jet of MR. There were no flail or prolapsing segments appreciated. Well visualized.  Moderately dilated annulus. Normal thickness leaflets . Leaflet separation was normal. Mobility was restricted. No echocardiographic evidence for prolapse. Doppler:   Mild to moderate regurgitation directed centrally.    Valve area by  continuity equation (using LVOT flow): 3.69cm^2.    Mean gradient: 3mm Hg (D). Peak gradient: 11mm Hg (D).  ------------------------------------------------------------ Left atrium:  Moderate to severe LA enlargemnet . No thrombus in LA or LA appendage. Well visualized. The atrium was moderately to severely dilated.  No evidence of thrombus in the atrial cavity or appendage. No spontaneous echo contrast was observed. The appendage was well visualized, morphologically a left appendage, multilobulated, and of normal size.  ------------------------------------------------------------ Atrial septum:  No PFO or ASD by color doppler or bubble study. Well visualized. No defect or patent foramen ovale was identified.  Echo contrast study showed no right-to-left atrial level shunt, following an increase in RA pressure induced by provocative maneuvers.  ------------------------------------------------------------ Right ventricle:  Well visualized. The cavity size was at the upper limits of normal. Systolic function was mildly to moderately reduced.  Cannot exclude hypokinesis of the RV.   ------------------------------------------------------------ Tricuspid valve:  Trace to 1+ tricuspid insufficiency. the TV leaflets appeared structurally normal.  ------------------------------------------------------------ Right atrium:  Well visualized. The atrium was moderately dilated.  ------------------------------------------------------------ Pre bypass:  Post bypass: Pre bypass: Post bypass: Pre bypass: Post bypass:  ------------------------------------------------------------ Post procedure conclusions Ascending Aorta:  - Aortic was of normal caliber with no atheromatous disaese   appreciated. well defined aortic root and ST ridge without   effacemant or enlargenment. The aorta was normal, not   dilated, and  non-diseased.  ------------------------------------------------------------ Stress echo results:     Post Bypass Findings:  Aortic Valve: there was a well funtioning bioprosthetic valve that was easily visualized. No AI, Mean trans-aortic gradient 11 mm hg.  Mitral Valve: Annuloplasty ring present No residual MR, mean transmitral gradient was 3 mm hg.  Left Ventricle: Markedly reduced in  size. LV end-diatolic diameter was 49 mm compared to 75 mm pre-bypass. There was a dyssynchronous contractile pattern due to ventricular pacing. There was mild/moderate diffusse LV hypokinesesis with regional wall motion abnormalities. LV EF was 40-45%  Right Ventricle: There was moderate RV dysfunction due to decreased contractility of the RV free wall. No septal flattening. No change from pre-bypass study.  Tricuspid valve: trace tricuspid insufficiency  ------------------------------------------------------------  2D measurements   Normal       Doppler measurements   Normal LVOT                           LVOT Diam, S   29 mm   ------       Peak vel, S  67.3 cm/s ------ Area    6.61 cm^2 ------       Mean vel, S  49.8 cm/s ------                                VTI, S       17.3 cm   ------                                Mean            1 mm   ------                                gradient, S       Hg                                Aortic valve                                Peak vel, S   297 cm/s ------                                Mean vel, S   212 cm/s ------                                VTI, S       60.7 cm   ------                                Mean           23 mm   ------                                gradient, S       Hg                                Peak           35 mm   ------  gradient, S       Hg                                VTI ratio    0.29      ------                                LVOT/AV                                Area, VTI     1.88 cm^2 ------                                Peak vel     0.23      ------                                ratio,                                LVOT/AV                                Area, Vmax    1.5 cm^2 ------                                Vmean ratio  0.23      ------                                LVOT/AV                                Area, Vmean  1.55 cm^2 ------                                Regurg peak   320 cm/s ------                                vel                                Regurg PHT    252 ms   ------                                Regurg peak    41 mm   ------                                gradient          Hg  Mitral valve                                Mean vel, D  67.5 cm/s ------                                Mean            3 mm   ------                                gradient, D       Hg                                Peak           11 mm   ------                                gradient, D       Hg                                Area (LVOT)  3.69 cm^2 ------                                continuity                                Annulus VTI    31 cm   ------   ------------------------------------------------------------ Prepared and Electronically Authenticated by  Kipp Brood [id number]     Cardiopulmonary Bypass:  A median sternotomy was performed. The pericardium was opened in the midline. Right ventricular function appeared normal. The ascending aorta was of normal size and had no palpable plaque. There were no contraindications to aortic cannulation or cross-clamping. The patient was fully systemically heparinized and the ACT was maintained > 400 sec. The proximal aortic arch was cannulated with a 20 F aortic cannula for arterial inflow. Venous cannulation was performed using bicaval venous cannulation with a 57F metal tip right angle cannula placed in the SVC and a 35 F malleable plastic cannula  in the IVC. An antegrade cardioplegia/vent cannula was inserted into the mid-ascending aorta.  A retrograde cardioplegia cannnula was placed into the coronary sinus via the right atrium. Aortic occlusion was performed with a single cross-clamp. Systemic cooling to 28 degrees Centigrade and topical cooling of the heart with iced saline were used. Hyperkalemic antegrade cold blood cardioplegia was used to induce diastolic arrest and then cold blood retrograde cardioplegia was given at about 20 minute intervals throughout the period of arrest to maintain myocardial temperature at or below 10 degrees centigrade. A temperature probe was inserted into the interventricular septum and an insulating pad was placed in the pericardium. Carbon dioxide was insufflated into the pericardium at 5L/min throughout the procedure to minimize intracardiac air.   Cox Maze IV:  After achieving diastolic arrest the left pulmonary veins were circled with a tape. An Atricure bipolar RF clamp was used to create a lesion around the pulmonary veins on  the left atrial wall. An RF lesion was then created at the base of the left atrial appendage. The cryo probe was then used to create a lesion joining the LAA and the left pulmonary veins. All cryo lesions were for 2 minutes. The left atrium was opened by a vertical incision in the interatrial groove. An encircling lesion was then created around the right pulmonary veins using the atriotomy incision anteriorly and a cryo lesion posteriorly. Then cryo lesions were created to join the pulmonary vein lesions superiorly and inferiorly. Another cryo lesion was placed between the pulmonary vein lesions in the midportion of the left atrium due to marked left atrial enlargement. A cryo lesion was placed between the inferior lesion that joined the pulmonary veins and the posterior mitral annulus at P2-3. A lesion was placed externally across the coronary sinus. The mitral valve repair was completed and  then the right atrium was opened obliquely from the atrial septum posteriorly to the AV groove anteriorly. A bipolar RF lesion was created to join the SVC and IVC posteriorly along the interatrial septum. A cryo lesion was created from this lesion, across the interatrial septum to the posterior aspect of the coronary sinus and then up to the tricuspid annulus. A cryo lesion was placed from the anterior aspect of this oblique atriotomy down to the tricuspid annulus. Another cryo lesion was placed obliquely across the RAA. A cryo lesion was placed from the RAA down to the tricuspid annulus. The right atriotomy was then closed with two layers of 4-0 prolene suture.    Mitral Valve Repair:  The left atrium was opened through a vertical incision in the interatrial groove. Exposure was good. Valve inspection showed normal leaflets and subvalvular apparatus. There was no prolapse and the regurgitation appeared to be related to a loss of coaptation from annular enlargement. A series of  2-0 Ethibond sutures were placed around the mitral annulus. The anterior leaflet and distance between the fibrous trigones were sized and a 30 mm Sorin 3D Memo annuloplasty ring was chosen ( Cat No. [id number], Serial # [id number]. The sutures were placed through the ring and it was lowered into place. The sutures were tied. The valve was tested with saline with complete distension of the LV and there was no regurgitation. The atrium was closed with 2 layers of continuous 3-0 prolene suture.   Ligation of left atrial appendage:  The base of the appendage was measured and a small 34 mm mitral clip was chosen. This was placed across the base of the LAA without difficulty (Model # [id number], Lot # [id number]).    Aortic Valve Replacement:   A transverse aortotomy was performed 1 cm above the take-off of the right coronary artery. The native valve was bicuspid with calcified leaflets and heavy annular calcification. The left and right valve cusps  were completely fused with a raphe that was calcified. The ostia of the coronary arteries were in normal position and were not obstructed. The native valve leaflets were excised and the annulus was decalcified with rongeurs. Care was taken to remove all particulate debris. The left ventricle was directly inspected for debris and then irrigated with ice saline solution. The annulus was sized and a size 25 mm Edwards Pericardial Magna-ease valve was chosen. The model number was [id number] and the serial number was [id number]. While the valve was being prepared 2-0 Ethibond pledgeted horizontal mattress sutures were placed around the annulus with the pledgets in a sub-annular position. The sutures were placed  through the sewing ring and the valve lowered into place. The sutures were tied sequentially. The valve seated nicely and the coronary ostia were not obstructed. The prosthetic valve leaflets moved normally and there was no sub-valvular obstruction. The aortotomy was closed using 4-0 Prolene suture in 2 layers with felt strips to reinforce the closure.   Completion:  The patient was rewarmed to 37 degrees Centigrade. De-airing maneuvers were performed and the head placed in trendelenburg position. The crossclamp was removed with a time of 226 minutes. There was spontaneous return of sinus rhythm. The aortotomy was checked for hemostasis. Two temporary epicardial pacing wires were placed on the right atrium and two on the right ventricle. The left ventricular vent and retrograde cardioplegia cannulas were removed. The patient was weaned from CPB without difficulty on low dose Milrinone and dopamine. CPB time was 252 minutes. Cardiac output was 5.5 LPM. Heparin was fully reversed with protamine and the aortic and venous cannulas removed. Hemostasis was achieved. Mediastinal drainage tubes were placed. The sternum was closed with double #6 stainless steel wires. The fascia was closed with continuous # 1 vicryl suture.  The subcutaneous tissue was closed with 2-0 vicryl continuous suture. The skin was closed with 3-0 vicryl subcuticular suture. All sponge, needle, and instrument counts were reported correct at the end of the case. Dry sterile dressings were placed over the incisions and around the chest tubes which were connected to pleurevac suction. The patient was then transported to the surgical intensive care unit in critical but stable condition.

## 2012-09-08 NOTE — Plan of Care (Signed)
Problem: Phase II Progression Outcomes Goal: CBGs/Blood glucose < or equal to 120 Outcome: Not Progressing Pt remains on Insulin gtt Goal: Tolerates D/C of vasopressors Outcome: Not Progressing Pt remains on NEO and Dopa gtts Goal: Patient extubated within - Outcome: Completed/Met Date Met:  09/08/12 Within 6 hours

## 2012-09-09 ENCOUNTER — Inpatient Hospital Stay (HOSPITAL_COMMUNITY): Payer: BC Managed Care – PPO

## 2012-09-09 LAB — CBC
Hemoglobin: 11.9 g/dL — ABNORMAL LOW (ref 13.0–17.0)
MCH: 26.7 pg (ref 26.0–34.0)
MCHC: 32.8 g/dL (ref 30.0–36.0)
Platelets: 90 10*3/uL — ABNORMAL LOW (ref 150–400)
RDW: 16.5 % — ABNORMAL HIGH (ref 11.5–15.5)

## 2012-09-09 LAB — BASIC METABOLIC PANEL
BUN: 31 mg/dL — ABNORMAL HIGH (ref 6–23)
Calcium: 9.2 mg/dL (ref 8.4–10.5)
Creatinine, Ser: 1.53 mg/dL — ABNORMAL HIGH (ref 0.50–1.35)
GFR calc non Af Amer: 50 mL/min — ABNORMAL LOW (ref >90)
Glucose, Bld: 131 mg/dL — ABNORMAL HIGH (ref 70–99)

## 2012-09-09 LAB — GLUCOSE, CAPILLARY

## 2012-09-09 MED ORDER — SODIUM CHLORIDE 0.9 % IJ SOLN: 3.0000 mL | INTRAMUSCULAR | Status: DC | PRN

## 2012-09-09 MED ORDER — FAMOTIDINE 20 MG PO TABS
20.0000 mg | ORAL_TABLET | Freq: Two times a day (BID) | ORAL | Status: DC
  Administered 2012-09-09 – 2012-09-12 (×7): 20 mg via ORAL
  Filled 2012-09-09 (×9): qty 1

## 2012-09-09 MED ORDER — ACETAMINOPHEN 325 MG PO TABS
650.0000 mg | ORAL_TABLET | Freq: Four times a day (QID) | ORAL | Status: DC | PRN
  Administered 2012-09-10 – 2012-09-12 (×4): 650 mg via ORAL
  Filled 2012-09-09 (×4): qty 2

## 2012-09-09 MED ORDER — ONDANSETRON HCL 4 MG/2ML IJ SOLN: 4.0000 mg | Freq: Four times a day (QID) | INTRAMUSCULAR | Status: DC | PRN

## 2012-09-09 MED ORDER — FUROSEMIDE 40 MG PO TABS
40.0000 mg | ORAL_TABLET | Freq: Every day | ORAL | Status: DC
  Administered 2012-09-10 – 2012-09-12 (×3): 40 mg via ORAL
  Filled 2012-09-09 (×5): qty 1

## 2012-09-09 MED ORDER — BISACODYL 10 MG RE SUPP: 10.0000 mg | Freq: Every day | RECTAL | Status: DC | PRN

## 2012-09-09 MED ORDER — BISACODYL 5 MG PO TBEC
10.0000 mg | DELAYED_RELEASE_TABLET | Freq: Every day | ORAL | Status: DC | PRN
Start: 2012-09-09 — End: 2012-09-12

## 2012-09-09 MED ORDER — DOCUSATE SODIUM 100 MG PO CAPS
200.0000 mg | ORAL_CAPSULE | Freq: Every day | ORAL | Status: DC
  Administered 2012-09-10 – 2012-09-11 (×2): 200 mg via ORAL
  Filled 2012-09-09 (×4): qty 2

## 2012-09-09 MED ORDER — RIVAROXABAN 20 MG PO TABS
20.0000 mg | ORAL_TABLET | Freq: Every day | ORAL | Status: DC
  Filled 2012-09-09: qty 1

## 2012-09-09 MED ORDER — POTASSIUM CHLORIDE CRYS ER 20 MEQ PO TBCR
20.0000 meq | EXTENDED_RELEASE_TABLET | Freq: Every day | ORAL | Status: DC
  Administered 2012-09-10 – 2012-09-12 (×3): 20 meq via ORAL
  Filled 2012-09-09 (×4): qty 1

## 2012-09-09 MED ORDER — RIVAROXABAN 20 MG PO TABS
20.0000 mg | ORAL_TABLET | Freq: Every day | ORAL | Status: DC
  Administered 2012-09-09 – 2012-09-12 (×4): 20 mg via ORAL
  Filled 2012-09-09 (×5): qty 1

## 2012-09-09 MED ORDER — OXYCODONE HCL 5 MG PO TABS: 5.0000 mg | ORAL_TABLET | ORAL | Status: DC | PRN

## 2012-09-09 MED ORDER — FUROSEMIDE 10 MG/ML IJ SOLN
40.0000 mg | Freq: Once | INTRAMUSCULAR | Status: AC
  Administered 2012-09-09: 40 mg via INTRAVENOUS
  Filled 2012-09-09: qty 4

## 2012-09-09 MED ORDER — SODIUM CHLORIDE 0.9 % IV SOLN: 250.0000 mL | INTRAVENOUS | Status: DC | PRN

## 2012-09-09 MED ORDER — SODIUM CHLORIDE 0.9 % IJ SOLN
3.0000 mL | Freq: Two times a day (BID) | INTRAMUSCULAR | Status: DC
  Administered 2012-09-10 – 2012-09-11 (×3): 3 mL via INTRAVENOUS

## 2012-09-09 MED ORDER — MOVING RIGHT ALONG BOOK
Freq: Once | Status: AC
  Administered 2012-09-09: 11:00:00
  Filled 2012-09-09: qty 1

## 2012-09-09 MED ORDER — TRAMADOL HCL 50 MG PO TABS: 50.0000 mg | ORAL_TABLET | ORAL | Status: DC | PRN

## 2012-09-09 MED ORDER — ONDANSETRON HCL 4 MG PO TABS: 4.0000 mg | ORAL_TABLET | Freq: Four times a day (QID) | ORAL | Status: DC | PRN

## 2012-09-09 NOTE — Plan of Care (Signed)
Problem: Phase III Progression Outcomes Goal: Transfer to PCTU/Telemetry POD Outcome: Completed/Met Date Met:  09/09/12 Pt transferred to PCTU bed at 1015 on 09/09/2012.

## 2012-09-09 NOTE — Progress Notes (Signed)
Patient transferred from Unit 2300 to Unit 2000, after report was given to receiving RN. Patient's meds, chart, and personal belongings sent with patient.  Wife called regarding transfer.   Keitha Butte, RN

## 2012-09-09 NOTE — Progress Notes (Signed)
Pt amb 500 ft with minimal assistance. Pt did not have any complaints. Pt placed in bed with call bell in reach. Will continue to monitor pt closely.

## 2012-09-09 NOTE — Progress Notes (Addendum)
Pt amb 450 ft pushing W/C. Pt had some complaints of SOB upon returning to room. Pt placed back in bed and O2 sat checked. Reading was 99% on RA. Pt instructed to continue to use IS and to utilize deep breathing exercises to improve breathing. Pt verbalized understanding. Will continue to monitor pt closely.

## 2012-09-09 NOTE — Progress Notes (Addendum)
2 Days Post-Op Procedure(s) (LRB): AORTIC VALVE REPLACEMENT (AVR) (N/A) INTRAOPERATIVE TRANSESOPHAGEAL ECHOCARDIOGRAM (N/A) MITRAL VALVE REPAIR (MVR) (N/A) MAZE (N/A) CLIPPING OF ATRIAL APPENDAGE (N/A) Subjective: No complaints  Objective: Vital signs in last 24 hours: Temp:  [97.6 F (36.4 C)-98.6 F (37 C)] 98.5 F (36.9 C) (08/03 0400) Pulse Rate:  [62-82] 68 (08/03 0700) Cardiac Rhythm:  [-] Normal sinus rhythm (08/03 0700) Resp:  [12-21] 19 (08/03 0700) BP: (95-149)/(70-95) 140/83 mmHg (08/03 0700) SpO2:  [95 %-100 %] 99 % (08/03 0700) Arterial Line BP: (107-143)/(60-78) 134/78 mmHg (08/02 1300) Weight:  [94.8 kg (208 lb 15.9 oz)] 94.8 kg (208 lb 15.9 oz) (08/03 0600)  Hemodynamic parameters for last 24 hours: PAP: (38)/(23) 38/23 mmHg  Intake/Output from previous day: 08/02 0701 - 08/03 0700 In: 903.9 [P.O.:180; I.V.:673.9; IV Piggyback:50] Out: 965 [Urine:940; Chest Tube:25] Intake/Output this shift:    General appearance: alert and cooperative Neurologic: intact Heart: regular rate and rhythm, S1, S2 normal, no murmur, click, rub or gallop Lungs: clear to auscultation bilaterally Extremities: edema mild Wound: dressing dry  Lab Results:  Recent Labs  09/08/12 1630 09/08/12 1638 09/09/12 0430  WBC 23.3*  --  19.4*  HGB 13.0 13.9 11.9*  HCT 38.7* 41.0 36.3*  PLT 99*  --  90*   BMET:  Recent Labs  09/08/12 0400  09/08/12 1638 09/09/12 0430  NA 136  --  137 134*  K 4.5  --  4.6 4.7  CL 106  --  104 102  CO2 21  --   --  24  GLUCOSE 135*  --  130* 131*  BUN 25*  --  24* 31*  CREATININE 1.55*  < > 1.70* 1.53*  CALCIUM 8.5  --   --  9.2  < > = values in this interval not displayed.  PT/INR:  Recent Labs  09/07/12 1526  LABPROT 18.2*  INR 1.55*   ABG    Component Value Date/Time   PHART 7.307* 09/07/2012 2138   HCO3 20.1 09/07/2012 2138   TCO2 22 09/08/2012 1638   ACIDBASEDEF 6.0* 09/07/2012 2138   O2SAT 92.0 09/07/2012 2138   CBG (last 3)    Recent Labs  09/08/12 2002 09/09/12 0023 09/09/12 0427  GLUCAP 127* 124* 123*   CXR: clear.  Assessment/Plan: S/P Procedure(s) (LRB): AORTIC VALVE REPLACEMENT (AVR) (N/A) INTRAOPERATIVE TRANSESOPHAGEAL ECHOCARDIOGRAM (N/A) MITRAL VALVE REPAIR (MVR) (N/A) MAZE (N/A) CLIPPING OF ATRIAL APPENDAGE (N/A) Hemodynamically stable in sinus rhythm 70's. He has some periods of slower junctional rhythm that are brief with bigeminy. Will hold off on BB for now. Keep pacer attached but off. Start Xarelto today, no ASA Mobilize Creatinine is at baseline from preop Mild volume excess: Diuresis Plan for transfer to step-down: see transfer orders   LOS: 2 days    Janal Haak K 09/09/2012

## 2012-09-10 LAB — BASIC METABOLIC PANEL
GFR calc Af Amer: 68 mL/min — ABNORMAL LOW (ref >90)
GFR calc non Af Amer: 59 mL/min — ABNORMAL LOW (ref >90)
Glucose, Bld: 94 mg/dL (ref 70–99)
Potassium: 4 mEq/L (ref 3.5–5.1)
Sodium: 134 mEq/L — ABNORMAL LOW (ref 135–145)

## 2012-09-10 MED FILL — Sodium Chloride Irrigation Soln 0.9%: Qty: 3000 | Status: AC

## 2012-09-10 MED FILL — Lidocaine HCl IV Inj 20 MG/ML: INTRAVENOUS | Qty: 5 | Status: AC

## 2012-09-10 MED FILL — Magnesium Sulfate Inj 50%: INTRAMUSCULAR | Qty: 10 | Status: AC

## 2012-09-10 MED FILL — Sodium Bicarbonate IV Soln 8.4%: INTRAVENOUS | Qty: 50 | Status: AC

## 2012-09-10 MED FILL — Heparin Sodium (Porcine) Inj 1000 Unit/ML: INTRAMUSCULAR | Qty: 30 | Status: AC

## 2012-09-10 MED FILL — Electrolyte-R (PH 7.4) Solution: INTRAVENOUS | Qty: 4000 | Status: AC

## 2012-09-10 MED FILL — Sodium Chloride IV Soln 0.9%: INTRAVENOUS | Qty: 1000 | Status: AC

## 2012-09-10 MED FILL — Potassium Chloride Inj 2 mEq/ML: INTRAVENOUS | Qty: 40 | Status: AC

## 2012-09-10 NOTE — Progress Notes (Signed)
Pt amb 953ft with no assistance with family. Pt with no complaints. Call bell within reach. Will continue to monitor pt closely.

## 2012-09-10 NOTE — Progress Notes (Signed)
Utilization review completed. Glenisha Gundry, RN, BSN. 

## 2012-09-10 NOTE — Progress Notes (Signed)
Pt transferred to 2029 from 2016 with belongings. Pt amb to room with no assistance and placed in chair. CCMD alerted of move. Will continue to monitor pt closely.

## 2012-09-10 NOTE — Progress Notes (Signed)
CARDIAC REHAB PHASE I   PRE:  Rate/Rhythm: 78 JR    BP: sitting 130/90    SaO2: 100 RA  MODE:  Ambulation: 900 ft   POST:  Rate/Rhythm: 96 JR    BP: sitting 144/100     SaO2: 96 RA  Tolerated very well. Steady with x1 light assist. No device. Only c/o is right scapular pain. BP elevated. Applied heathing pad and pt asked for tylenol. Encouraged x2 more walks. [id number]   Harriet Masson CES, ACSM 09/10/2012 9:45 AM

## 2012-09-10 NOTE — Progress Notes (Addendum)
       [address]       [place] [address]             [phone]          3 Days Post-Op Procedure(s) (LRB): AORTIC VALVE REPLACEMENT (AVR) (N/A) INTRAOPERATIVE TRANSESOPHAGEAL ECHOCARDIOGRAM (N/A) MITRAL VALVE REPAIR (MVR) (N/A) MAZE (N/A) CLIPPING OF ATRIAL APPENDAGE (N/A)  Subjective: Some coughing last night and this am, mostly nonproductive.  Feeling better otherwise.   Objective: Vital signs in last 24 hours: Patient Vitals for the past 24 hrs:  BP Temp Temp src Pulse Resp SpO2 Weight  09/10/12 0505 127/88 mmHg 99 F (37.2 C) Oral 71 18 93 % -  09/10/12 0437 - - - - - - 211 lb 6.4 oz (95.89 kg)  09/09/12 2011 119/80 mmHg 99.4 F (37.4 C) Oral 66 18 95 % -  09/09/12 1413 138/85 mmHg 98.2 F (36.8 C) Oral 71 18 100 % -  09/09/12 1000 - - - - 27 - -  09/09/12 0900 132/92 mmHg - - 67 20 97 % -  09/09/12 0847 - 97.9 F (36.6 C) Oral - - - -  09/09/12 0800 142/89 mmHg - - 69 18 98 % -   Current Weight  09/10/12 211 lb 6.4 oz (95.89 kg)     Intake/Output from previous day: 08/03 0701 - 08/04 0700 In: 560 [P.O.:480; I.V.:80] Out: 650 [Urine:650]    PHYSICAL EXAM:  Heart: RRR Lungs: Decreased BS in bases Wound: Some sanguinous oozing from mid sternum, sternum stable. Extremities: Mild LE edema    Lab Results: CBC: Recent Labs  09/08/12 1630 09/08/12 1638 09/09/12 0430  WBC 23.3*  --  19.4*  HGB 13.0 13.9 11.9*  HCT 38.7* 41.0 36.3*  PLT 99*  --  90*   BMET:  Recent Labs  09/09/12 0430 09/10/12 0454  NA 134* 134*  K 4.7 4.0  CL 102 101  CO2 24 23  GLUCOSE 131* 94  BUN 31* 34*  CREATININE 1.53* 1.34  CALCIUM 9.2 9.0    PT/INR:  Recent Labs  09/07/12 1526  LABPROT 18.2*  INR 1.55*      Assessment/Plan: S/P Procedure(s) (LRB): AORTIC VALVE REPLACEMENT (AVR) (N/A) INTRAOPERATIVE TRANSESOPHAGEAL ECHOCARDIOGRAM (N/A) MITRAL VALVE REPAIR (MVR) (N/A) MAZE (N/A) CLIPPING OF ATRIAL APPENDAGE (N/A)  CV-  mostly SR, with some runs of bigeminy.  Not pacing.  Will d/c pacer box, leave wires for now. Not on a beta blocker. BPs trending up- may need to add agent. Continue Xarelto.  Vol overload- diurese.  Sternal drainage- mostly sanguinous.  Will paint with Betadine and watch.  Leukocytosis- WBC trending down some, no CBC today.  Will repeat in am.  CRI- Cr continues to trend down.  Watch.  CRPI. Pulm toilet.   LOS: 3 days    COLLINS,GINA H 09/10/2012   Chart reviewed, patient examined, agree with above. He looks great. He is in sinus most of the time in the 70's with some periods of junctional rhythm in 70's. No A-fib so far postop. He needs to continue diuresis. Probably home wed.

## 2012-09-11 ENCOUNTER — Encounter (HOSPITAL_COMMUNITY): Payer: Self-pay | Admitting: Surgery

## 2012-09-11 DIAGNOSIS — Z8679 Personal history of other diseases of the circulatory system: Secondary | ICD-10-CM

## 2012-09-11 DIAGNOSIS — Z952 Presence of prosthetic heart valve: Secondary | ICD-10-CM

## 2012-09-11 DIAGNOSIS — Z9889 Other specified postprocedural states: Secondary | ICD-10-CM

## 2012-09-11 LAB — BASIC METABOLIC PANEL
BUN: 34 mg/dL — ABNORMAL HIGH (ref 6–23)
Chloride: 102 mEq/L (ref 96–112)
GFR calc Af Amer: 69 mL/min — ABNORMAL LOW (ref >90)
Potassium: 4.2 mEq/L (ref 3.5–5.1)
Sodium: 136 mEq/L (ref 135–145)

## 2012-09-11 LAB — CBC
HCT: 34.7 % — ABNORMAL LOW (ref 39.0–52.0)
Hemoglobin: 11.3 g/dL — ABNORMAL LOW (ref 13.0–17.0)
RBC: 4.26 MIL/uL (ref 4.22–5.81)
WBC: 8.9 10*3/uL (ref 4.0–10.5)

## 2012-09-11 NOTE — Discharge Summary (Signed)
Physician Discharge Summary  Patient ID: Reginald Myers MRN: [id number] DOB/AGE: 55/31/1957 55 y.o.  Admit date: 09/07/2012 Discharge date: 09/12/2012  Admission Diagnoses:  Patient Active Problem List   Diagnosis Date Noted  . Shortness of breath   . Acute systolic heart failure   . Atrial fibrillation   . Aortic valve disorders    Discharge Diagnoses:   Patient Active Problem List   Diagnosis Date Noted  . S/P AVR 09/11/2012  . S/P MVR (mitral valve repair) 09/11/2012  . S/P Maze operation for atrial fibrillation 09/11/2012  . Shortness of breath   . Acute systolic heart failure   . Atrial fibrillation   . Aortic valve disorders    Discharged Condition: good  History of Present Illness:   The patient is a 57 year old gentleman in previously good health who was diagnosed with moderate to severe aortic insufficiency in December 2013 after his primary care physician heard a heart murmur. An echocardiogram at that time showed normal left ventricular function. He said he felt well. He was seen again last month by Dr. Eldridge Dace and reported that he had not felt well for about 1 month with exertional fatigue. He is not able to do his regular exercise routine. He was noted to be tachycardiac and was found to be in atrial fibrillation with rapid ventricular response. A followup echocardiogram in May of 2014 showed a significant decrease in his left ventricular function. An attempt was made at rate control.The patient continued to have fatigue and shortness of breath. Cardioversion was attempted but the patient only maintained normal rhythm for a brief period of time. He was started on amiodarone but did not tolerate that with nausea. He had another unsuccessful attempt at cardioversion. He was started on metoprolol but felt poorly after the first dose and gained about 18 pounds over a couple weeks with marked swelling in his legs and abdomen with development of orthopnea. A repeat  echocardiogram on 08/14/2012 showed left ventricular ejection fraction of 35-40% with diffuse hypokinesis. 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 There is moderate mitral regurgitation. There is moderate to severe tricuspid regurgitation with a mildly dilated right ventricle. Pulmonary systolic pressure was moderately increased with a peak pressure estimated 66 mmHg. He underwent right and left heart catheterization showing no significant coronary disease.  Due to the patient's symptoms and test results he was sent to [facility] for possible surgical intervention.  He was evaluated by Dr. Laneta Simmers on 08/17/2012 at which time it was felt the patient would be a candidate for surgery.  The risks and benefits of the procedure were explained to the patient and he was agreeable to proceed.  Hospital Course:   The patient presented to [facility] on 09/07/2012.  He was taken to the operating room and underwent Aortic Valve Replacement utilizing a 25 mm Edwards Magna Ease Pericardial Tissue Valve, a Mitral Valve Repair utilizing a 30 mm Sorin Memo 3D Ring, and a Complete Maze procedure of Bi Atrial lesion set with Radiofrequency Ablation and Cryothermy and Clipping of Left Atrial Appendage.  The patient tolerated the procedure well and was taken to the SICU in stable condition.  The patient was extubated the evening of surgery.  During his stay in the ICU he was weaned off all cardiac drips.  His chest tubes and arterial lines were removed without difficulty.  The patient was restarted on Xarelto for stroke prophylaxis.  He was maintaining  NSR, however he was experiencing some junctional rhythm with brief bigeminy.  Once medically stable the patient was transferred to the step down unit.  The patient has continued to progress.  He is maintaining NSR and his pacing wires have been removed without difficulty.  The patient did have a low grade  fever which has resolved and no evidence of Leukocytosis.  He is medically stable at this time.  Should no further issues arise we anticipate discharge home in the next 24-48 hours.  The patient will need to follow up with Dr. Laneta Simmers in 3 weeks with a CXR.  He will also need to follow up with Dr. Eldridge Dace in 2-4 weeks.         Significant Diagnostic Studies:   Echocardiogram:   - Left ventricle: Systolic function was moderately reduced. The estimated ejection fraction was in the range of 35% to 40%. Diffuse hypokinesis. - Aortic valve: Valve area: 1.88cm^2(VTI). Valve area: 1.5cm^2 (Vmax). Valve area: 1.55cm^2 (Vmean). - Mitral valve: Moderately dilated annulus. Normal thickness leaflets . Mobility was restricted. Mild to moderate regurgitation directed centrally. Valve area by continuity equation (using LVOT flow): 3.69cm^2. - Left atrium: The atrium was moderately to severely dilated. No evidence of thrombus in the atrial cavity or appendage. No spontaneous echo contrast was observed. - Right ventricle: Systolic function was mildly to moderately reduced. Cannot exclude hypokinesis of the RV. - Right atrium: The atrium was moderately dilated. - Atrial septum: No defect or patent foramen ovale was identified. Echo contrast study showed no right-to-left atrial level shunt, following an increase in RA pressure induced by provocative maneuvers.  Treatments: surgery:   1. Median Sternotomy 2. Extracorporeal circulation       3. Aortic valve replacement using a 25 mm Edwards Pericardial Magna-ease valve.        4. Mitral annuloplasty with a 30 mm Sorin Memo 3D ring        5. Cox MAZE IV using bipolar radiofrequency and cryothermy        6. Ligation of left atrial appendage using a 35 mm Atricure Atriclip   Disposition: 01-Home or Self Care  Discharge Medications:   Medication List    STOP taking these medications       digoxin 0.25 MG tablet  Commonly known as:  LANOXIN      diltiazem 180 MG 24 hr capsule  Commonly known as:  DILACOR XR      TAKE these medications       Coenzyme Q-10 100 MG capsule  Take 100 mg by mouth daily. Only taking 3 to 4 times per week     fish oil-omega-3 fatty acids 1000 MG capsule  Take 2 g by mouth daily.     furosemide 40 MG tablet  Commonly known as:  LASIX  Take 1 tablet (40 mg total) by mouth daily. For 7 days then stop.     multivitamin with minerals Tabs tablet  Take 1 tablet by mouth daily.     oxyCODONE 5 MG immediate release tablet  Commonly known as:  Oxy IR/ROXICODONE  Take 1-2 tablets (5-10 mg total) by mouth every 4 (four) hours as needed for pain.     potassium chloride SA 20 MEQ tablet  Commonly known as:  K-DUR,KLOR-CON  Take 1 tablet (20 mEq total) by mouth daily. For 7 days then stop.     Red Yeast Rice 600 MG Caps  Take 600 mg by mouth daily.     rosuvastatin 10 MG  tablet  Commonly known as:  CRESTOR  Take 10 mg by mouth 3 (three) times a week.     XARELTO 20 MG Tabs tablet  Generic drug:  Rivaroxaban  Take 20 mg by mouth daily.       The patient has been discharged on:   1.Beta Blocker:  Yes [   ]                              No   [ x  ]                              If No, reason: Junctional rhythm  2.Ace Inhibitor/ARB: Yes [   ]                                     No  [   x ]                                     If No, reason: Preserved LVEF  3.Statin:   Yes [  x ]                  No  [   ]                  If No, reason:  4.Ecasa:  Yes  [   ]                  No   [  x ]                  If No, reason: No CAD        Future Appointments Provider Department Dept Phone   10/03/2012 2:00 PM Alleen Borne, MD [facility] [phone]     Follow-up Information   Follow up with Alleen Borne, MD On 10/03/2012. (Appointment is at 2:00)    Contact information:   [address] [employer] [address] [place] [address] [phone]        Follow up with [facility] On 10/03/2012. (Please get CXR at 1:00)    Contact information:   [place]       Schedule an appointment as soon as possible for a visit with Corky Crafts., MD. (Please contact office to set up 2-4 week follow up)    Contact information:   [address] [place] [address] [phone]       Signed: Ardelle Balls 09/12/2012, 7:42 AM

## 2012-09-11 NOTE — Progress Notes (Signed)
Patient has been ambulating independently in the hallway with family.  Tolerating well.  Will continue to monitor.  Arva Chafe

## 2012-09-11 NOTE — Progress Notes (Signed)
Pt up ambulating in hallway independently at this time; no needs voiced; will cont. To monitor. 

## 2012-09-11 NOTE — Progress Notes (Signed)
Pt up ambulating in hallway independently at this time; no complaints; will cont. To monitor.

## 2012-09-11 NOTE — Care Management Note (Unsigned)
    Page 1 of 1   09/11/2012     4:25:13 PM   CARE MANAGEMENT NOTE 09/11/2012  Patient:  Reginald Myers, Reginald Myers   Account Number:  [id number]  Date Initiated:  09/11/2012  Documentation initiated by:  Josyah Achor  Subjective/Objective Assessment:   PT S/P AVR/MVR/MAZE ON 09/07/12. PTA, PT INDEPENDENT, LIVES WITH WIFE.     Action/Plan:   WILL FOLLOW FOR HOME NEEDS AS PT PROGRESSES.   Anticipated DC Date:  09/12/2012   Anticipated DC Plan:  HOME/SELF CARE      DC Planning Services  CM consult      Choice offered to / List presented to:             Status of service:  In process, will continue to follow Medicare Important Message given?   (If response is "NO", the following Medicare IM given date fields will be blank) Date Medicare IM given:   Date Additional Medicare IM given:    Discharge Disposition:    Per UR Regulation:  Reviewed for med. necessity/level of care/duration of stay  If discussed at Long Length of Stay Meetings, dates discussed:    Comments:

## 2012-09-11 NOTE — Progress Notes (Signed)
1005 Pt walking independently. Will follow up tomorrow for ed prior to d/c. Luetta Nutting RNBSN

## 2012-09-11 NOTE — Progress Notes (Addendum)
      [address]       [place] [address]             [phone]      4 Days Post-Op Procedure(s) (LRB): AORTIC VALVE REPLACEMENT (AVR) (N/A) INTRAOPERATIVE TRANSESOPHAGEAL ECHOCARDIOGRAM (N/A) MITRAL VALVE REPAIR (MVR) (N/A) MAZE (N/A) CLIPPING OF ATRIAL APPENDAGE (N/A)  Subjective:  Reginald Myers is feeling better this morning.  He states that he had a fever overnight and didn't feel all that great.  He is ambulating without difficulty.  +BM  Objective: Vital signs in last 24 hours: Temp:  [97.6 F (36.4 C)-100.5 F (38.1 C)] 100.5 F (38.1 C) (08/05 0516) Pulse Rate:  [79-91] 91 (08/05 0516) Cardiac Rhythm:  [-] Normal sinus rhythm;Junctional rhythm (08/05 0745) Resp:  [16-20] 20 (08/05 0516) BP: (122-130)/(81-90) 123/81 mmHg (08/05 0516) SpO2:  [98 %-99 %] 98 % (08/05 0516) Weight:  [210 lb (95.255 kg)] 210 lb (95.255 kg) (08/05 0516)  Intake/Output from previous day: 08/04 0701 - 08/05 0700 In: 123 [P.O.:120; I.V.:3] Out: 1295 [Urine:1295] Intake/Output this shift: Total I/O In: 240 [P.O.:240] Out: 450 [Urine:450]  General appearance: alert, cooperative and no distress Heart: regular rate and rhythm Lungs: clear to auscultation bilaterally Abdomen: soft, non-tender; bowel sounds normal; no masses,  no organomegaly Extremities: edema minor pitting R>L Wound: clean and dry, some serous drainage from sternotomy  Lab Results:  Recent Labs  09/09/12 0430 09/11/12 0500  WBC 19.4* 8.9  HGB 11.9* 11.3*  HCT 36.3* 34.7*  PLT 90* 77*   BMET:  Recent Labs  09/10/12 0454 09/11/12 0500  NA 134* 136  K 4.0 4.2  CL 101 102  CO2 23 25  GLUCOSE 94 100*  BUN 34* 34*  CREATININE 1.34 1.33  CALCIUM 9.0 9.0    PT/INR: No results found for this basename: LABPROT, INR,  in the last 72 hours ABG    Component Value Date/Time   PHART 7.307* 09/07/2012 2138   HCO3 20.1 09/07/2012 2138   TCO2 22 09/08/2012 1638   ACIDBASEDEF 6.0* 09/07/2012 2138   O2SAT 92.0 09/07/2012 2138   CBG (last 3)   Recent Labs  09/09/12 0023 09/09/12 0427 09/09/12 0814  GLUCAP 124* 123* 97    Assessment/Plan: S/P Procedure(s) (LRB): AORTIC VALVE REPLACEMENT (AVR) (N/A) INTRAOPERATIVE TRANSESOPHAGEAL ECHOCARDIOGRAM (N/A) MITRAL VALVE REPAIR (MVR) (N/A) MAZE (N/A) CLIPPING OF ATRIAL APPENDAGE (N/A)  1. CV- NSR tachy, pressure controlled- may benefit from low dose Beta Blocker 2. Pulm- no acute issues, minor atelectasis, good use of IS 3. Renal- creatinine trending down, mildly volume overloaded, continue Lasix 4. Expected blood loss anemia stable- hgb 11.3 5. Febrile overnight- no Leukocytosis 6. Dispo- patient stable, will d/c EPW today, likely home in AM    LOS: 4 days    BARRETT, ERIN 09/11/2012   Chart reviewed, patient examined, agree with above. He looks good and is ambulating well. His wt is still 9 lbs over preop so he will need some lasix for at least a week. His rhythm looks sinus with periods of junctional on the monitor. Will check and ECG in the am before he goes home.

## 2012-09-11 NOTE — Progress Notes (Signed)
EPW d/c at this time per MD order; VSS: bedrest until 1125; L EPW site oozed slightly; dressing applied; will cont. To monitor.

## 2012-09-12 MED ORDER — POTASSIUM CHLORIDE CRYS ER 20 MEQ PO TBCR: 20.0000 meq | EXTENDED_RELEASE_TABLET | Freq: Every day | ORAL | Status: DC

## 2012-09-12 MED ORDER — FUROSEMIDE 40 MG PO TABS: 40.0000 mg | ORAL_TABLET | Freq: Every day | ORAL | Status: DC

## 2012-09-12 MED ORDER — OXYCODONE HCL 5 MG PO TABS: 5.0000 mg | ORAL_TABLET | ORAL | Status: DC | PRN

## 2012-09-12 NOTE — Progress Notes (Signed)
IV and tele monitor d/c at this time; pt and wife given d/c instructions; chest tube sutures d/c at this time also; steri strips applied to sites.

## 2012-09-12 NOTE — Progress Notes (Addendum)
Ed completed. Requests his name be sent to [facility]. Set up d/c video. [id number] Ethelda Chick CES, ACSM 9:11 AM 09/12/2012

## 2012-09-12 NOTE — Progress Notes (Signed)
5 Days Post-Op Procedure(s) (LRB): AORTIC VALVE REPLACEMENT (AVR) (N/A) INTRAOPERATIVE TRANSESOPHAGEAL ECHOCARDIOGRAM (N/A) MITRAL VALVE REPAIR (MVR) (N/A) MAZE (N/A) CLIPPING OF ATRIAL APPENDAGE (N/A) Subjective: No complaints  Objective: Vital signs in last 24 hours: Temp:  [97.8 F (36.6 C)-98.3 F (36.8 C)] 98.3 F (36.8 C) (08/06 0510) Pulse Rate:  [75-99] 99 (08/06 0510) Cardiac Rhythm:  [-] Normal sinus rhythm;Junctional rhythm;Heart block (08/06 0745) Resp:  [18] 18 (08/06 0510) BP: (120-134)/(85-96) 133/96 mmHg (08/06 0510) SpO2:  [96 %-100 %] 99 % (08/06 0510) Weight:  [96.5 kg (212 lb 11.9 oz)] 96.5 kg (212 lb 11.9 oz) (08/06 0510)  Hemodynamic parameters for last 24 hours:    Intake/Output from previous day: 08/05 0701 - 08/06 0700 In: 960 [P.O.:960] Out: 3251 [Urine:3250; Stool:1] Intake/Output this shift: Total I/O In: 240 [P.O.:240] Out: 500 [Urine:500]  General appearance: alert and cooperative Neurologic: intact Heart: regular rate and rhythm, S1, S2 normal, no murmur, click, rub or gallop Lungs: clear to auscultation bilaterally Extremities: edema mild Wound: incision ok  Lab Results:  Recent Labs  09/11/12 0500  WBC 8.9  HGB 11.3*  HCT 34.7*  PLT 77*   BMET:  Recent Labs  09/10/12 0454 09/11/12 0500  NA 134* 136  K 4.0 4.2  CL 101 102  CO2 23 25  GLUCOSE 94 100*  BUN 34* 34*  CREATININE 1.34 1.33  CALCIUM 9.0 9.0    PT/INR: No results found for this basename: LABPROT, INR,  in the last 72 hours ABG    Component Value Date/Time   PHART 7.307* 09/07/2012 2138   HCO3 20.1 09/07/2012 2138   TCO2 22 09/08/2012 1638   ACIDBASEDEF 6.0* 09/07/2012 2138   O2SAT 92.0 09/07/2012 2138   CBG (last 3)  No results found for this basename: GLUCAP,  in the last 72 hours  Assessment/Plan: S/P Procedure(s) (LRB): AORTIC VALVE REPLACEMENT (AVR) (N/A) INTRAOPERATIVE TRANSESOPHAGEAL ECHOCARDIOGRAM (N/A) MITRAL VALVE REPAIR (MVR) (N/A) MAZE  (N/A) CLIPPING OF ATRIAL APPENDAGE (N/A)  He is doing well. ECG this am looks sinus to me with small p-waves. I think he can go home.   LOS: 5 days    Reginald Myers K 09/12/2012

## 2012-10-01 ENCOUNTER — Other Ambulatory Visit: Payer: Self-pay | Admitting: *Deleted

## 2012-10-01 DIAGNOSIS — I350 Nonrheumatic aortic (valve) stenosis: Secondary | ICD-10-CM

## 2012-10-03 ENCOUNTER — Ambulatory Visit
Admission: RE | Admit: 2012-10-03 | Discharge: 2012-10-03 | Disposition: A | Discharge status: Home / Self Care | Payer: BC Managed Care – PPO | Source: Ambulatory Visit | Attending: Surgery | Admitting: Surgery

## 2012-10-03 ENCOUNTER — Other Ambulatory Visit: Payer: Self-pay | Admitting: *Deleted

## 2012-10-03 ENCOUNTER — Ambulatory Visit (INDEPENDENT_AMBULATORY_CARE_PROVIDER_SITE_OTHER): Payer: BC Managed Care – PPO | Admitting: Surgery

## 2012-10-03 ENCOUNTER — Encounter: Payer: Self-pay | Admitting: Surgery

## 2012-10-03 VITALS — BP 112/80 | HR 108 | Resp 20 | Ht 73.0 in | Wt 212.0 lb

## 2012-10-03 DIAGNOSIS — I359 Nonrheumatic aortic valve disorder, unspecified: Secondary | ICD-10-CM

## 2012-10-03 DIAGNOSIS — Z8679 Personal history of other diseases of the circulatory system: Secondary | ICD-10-CM

## 2012-10-03 DIAGNOSIS — Z9889 Other specified postprocedural states: Secondary | ICD-10-CM

## 2012-10-03 DIAGNOSIS — Z954 Presence of other heart-valve replacement: Secondary | ICD-10-CM

## 2012-10-03 DIAGNOSIS — Z952 Presence of prosthetic heart valve: Secondary | ICD-10-CM

## 2012-10-04 ENCOUNTER — Encounter: Payer: Self-pay | Admitting: Surgery

## 2012-10-04 NOTE — Progress Notes (Signed)
      [address]       [place]inal[place] [address]             [phone]        HPI:  Patient returns for routine postoperative follow-up having undergone AVR with a 25 mm pericardial valve, Mitral annuloplasty and Cox MAZE IV procedure and ligation of the LAA on 09/08/2012. The patient's early postoperative recovery while in the hospital was notable for an uncomplicated postop course. He had some intermittent junctional rhythm postop but no A-fib. Since hospital discharge the patient reports that he has been feeling well and walking daily without chest pain or dyspnea. He has not noticed any tachypalpitations.   Current Outpatient Prescriptions  Medication Sig Dispense Refill  . acetaminophen (TYLENOL) 325 MG tablet Take 650 mg by mouth every 6 (six) hours as needed for pain.      . Rivaroxaban (XARELTO) 20 MG TABS Take 20 mg by mouth daily.       No current facility-administered medications for this visit.    Physical Exam: BP 112/80  Pulse 108  Resp 20  Ht 6\' 1"  (1.854 m)  Wt 212 lb (96.163 kg)  BMI 27.98 kg/m2  SpO2 99% He looks well Lungs are clear Cardiac exam shows a regular rate and rhythm with normal prosthetic valve sounds and no murmurs. The incision is healing well The sternum is stable There is no peripheral edema  Diagnostic Tests:  CLINICAL DATA:  Aortic valve replacement   EXAM: CHEST  2 VIEW   COMPARISON:  09/09/2012   FINDINGS: Aortic valve and atrial clips noted. Right IJ introducer sheath is been removed.   The small right and trace left pleural effusions. Mild to moderate enlargement of the cardiopericardial silhouette. No edema.   IMPRESSION: 1. Small right and trace left pleural effusions. 2. Enlarged cardiopericardial silhouette, without edema.     Electronically Signed   By: Herbie Baltimore   On: 10/03/2012 13:28   Impression:  Overall he is doing well postop. He has a regular rhythm. He has an appt soon with Dr.  Eldridge Dace who will decide how long to continue the Xarelto. He has returned to driving. I asked him not to lift anything heavier than 10 lbs for 3 months postop. He will return to work when he feels able.  Plan:  He will followup with Dr. Eldridge Dace and Dr. Kevan Ny and will contact me if he has any problems with his incisions.

## 2012-10-25 ENCOUNTER — Encounter (HOSPITAL_COMMUNITY)
Admission: RE | Admit: 2012-10-25 | Discharge: 2012-10-25 | Disposition: A | Discharge status: Home / Self Care | Payer: BC Managed Care – PPO | Source: Ambulatory Visit | Attending: Interventional Cardiology | Admitting: Interventional Cardiology

## 2012-10-25 DIAGNOSIS — Z5189 Encounter for other specified aftercare: Secondary | ICD-10-CM | POA: Insufficient documentation

## 2012-10-25 DIAGNOSIS — I080 Rheumatic disorders of both mitral and aortic valves: Secondary | ICD-10-CM | POA: Insufficient documentation

## 2012-10-25 DIAGNOSIS — I4891 Unspecified atrial fibrillation: Secondary | ICD-10-CM | POA: Insufficient documentation

## 2012-10-25 DIAGNOSIS — I509 Heart failure, unspecified: Secondary | ICD-10-CM | POA: Insufficient documentation

## 2012-10-25 DIAGNOSIS — I079 Rheumatic tricuspid valve disease, unspecified: Secondary | ICD-10-CM | POA: Insufficient documentation

## 2012-10-25 DIAGNOSIS — I2789 Other specified pulmonary heart diseases: Secondary | ICD-10-CM | POA: Insufficient documentation

## 2012-10-25 DIAGNOSIS — I5021 Acute systolic (congestive) heart failure: Secondary | ICD-10-CM | POA: Insufficient documentation

## 2012-10-25 NOTE — Progress Notes (Signed)
Cardiac Rehab Medication Review by a Pharmacist  Does the patient  feel that his/her medications are working for him/her?  yes  Has the patient been experiencing any side effects to the medications prescribed?  no  Does the patient measure his/her own blood pressure or blood glucose at home?  No, but he states sometimes he checks it when he is at [facility] on their blood pressure cuffs.  Does the patient have any problems obtaining medications due to transportation or finances?   no  Understanding of regimen: good Understanding of indications: good Potential of compliance: good    Pharmacist comments: Pt is knowledgeable about his medications, states he does not take any herbals/supplements, or any other medications.  But he states he might consider some supplements maybe in the future.  Suggested he talk to his physicians before starting any supplements.  Anabel Bene, PharmD Clinical Pharmacist Resident Pager: [phone]   Anabel Bene 10/25/2012 8:09 AM

## 2012-10-29 ENCOUNTER — Encounter (HOSPITAL_COMMUNITY): Payer: BC Managed Care – PPO

## 2012-10-29 ENCOUNTER — Encounter (HOSPITAL_COMMUNITY)
Admission: RE | Admit: 2012-10-29 | Discharge: 2012-10-29 | Disposition: A | Discharge status: Home / Self Care | Payer: BC Managed Care – PPO | Source: Ambulatory Visit | Attending: Cardiovascular Disease | Admitting: Cardiovascular Disease

## 2012-10-29 NOTE — Progress Notes (Signed)
Pt started cardiac rehab today.  Pt tolerated light exercise without difficulty. VSS, NSR with freq PVC.  Asymptomatic. PHQ-0.  Pt oriented to exercise equipment and routine.  Understanding verbalized.

## 2012-10-31 ENCOUNTER — Encounter (HOSPITAL_COMMUNITY): Payer: BC Managed Care – PPO

## 2012-10-31 ENCOUNTER — Encounter (HOSPITAL_COMMUNITY)
Admission: RE | Admit: 2012-10-31 | Discharge: 2012-10-31 | Disposition: A | Discharge status: Home / Self Care | Payer: BC Managed Care – PPO | Source: Ambulatory Visit | Attending: Cardiovascular Disease | Admitting: Cardiovascular Disease

## 2012-11-02 ENCOUNTER — Encounter (HOSPITAL_COMMUNITY)
Admission: RE | Admit: 2012-11-02 | Discharge: 2012-11-02 | Disposition: A | Discharge status: Home / Self Care | Payer: BC Managed Care – PPO | Source: Ambulatory Visit | Attending: Cardiovascular Disease | Admitting: Cardiovascular Disease

## 2012-11-02 ENCOUNTER — Encounter (HOSPITAL_COMMUNITY): Payer: BC Managed Care – PPO

## 2012-11-02 NOTE — Progress Notes (Signed)
PSYCHOSOCIAL ASSESSMENT  Pt psychosocial assessment reveals no barriers to rehab participation.  Pt exhibits positive coping skills and has supportive family.  Offered emotional support and reassurance.  Will continue to monitor. 

## 2012-11-05 ENCOUNTER — Encounter (HOSPITAL_COMMUNITY)
Admission: RE | Admit: 2012-11-05 | Discharge: 2012-11-05 | Disposition: A | Discharge status: Home / Self Care | Payer: BC Managed Care – PPO | Source: Ambulatory Visit | Attending: Cardiovascular Disease | Admitting: Cardiovascular Disease

## 2012-11-05 ENCOUNTER — Encounter (HOSPITAL_COMMUNITY): Payer: BC Managed Care – PPO

## 2012-11-07 ENCOUNTER — Encounter (HOSPITAL_COMMUNITY): Payer: BC Managed Care – PPO

## 2012-11-07 ENCOUNTER — Encounter (HOSPITAL_COMMUNITY)
Admission: RE | Admit: 2012-11-07 | Discharge: 2012-11-07 | Disposition: A | Discharge status: Home / Self Care | Payer: BC Managed Care – PPO | Source: Ambulatory Visit | Attending: Cardiovascular Disease | Admitting: Cardiovascular Disease

## 2012-11-07 DIAGNOSIS — I079 Rheumatic tricuspid valve disease, unspecified: Secondary | ICD-10-CM | POA: Insufficient documentation

## 2012-11-07 DIAGNOSIS — Z5189 Encounter for other specified aftercare: Secondary | ICD-10-CM | POA: Insufficient documentation

## 2012-11-07 DIAGNOSIS — I2789 Other specified pulmonary heart diseases: Secondary | ICD-10-CM | POA: Insufficient documentation

## 2012-11-07 DIAGNOSIS — I5021 Acute systolic (congestive) heart failure: Secondary | ICD-10-CM | POA: Insufficient documentation

## 2012-11-07 DIAGNOSIS — I509 Heart failure, unspecified: Secondary | ICD-10-CM | POA: Insufficient documentation

## 2012-11-07 DIAGNOSIS — I4891 Unspecified atrial fibrillation: Secondary | ICD-10-CM | POA: Insufficient documentation

## 2012-11-07 DIAGNOSIS — I080 Rheumatic disorders of both mitral and aortic valves: Secondary | ICD-10-CM | POA: Insufficient documentation

## 2012-11-09 ENCOUNTER — Encounter (HOSPITAL_COMMUNITY): Payer: BC Managed Care – PPO

## 2012-11-12 ENCOUNTER — Encounter (HOSPITAL_COMMUNITY): Payer: BC Managed Care – PPO

## 2012-11-12 ENCOUNTER — Encounter (HOSPITAL_COMMUNITY)
Admission: RE | Admit: 2012-11-12 | Discharge: 2012-11-12 | Disposition: A | Discharge status: Home / Self Care | Payer: BC Managed Care – PPO | Source: Ambulatory Visit | Attending: Cardiovascular Disease | Admitting: Cardiovascular Disease

## 2012-11-13 ENCOUNTER — Encounter: Payer: Self-pay | Admitting: Cardiovascular Disease

## 2012-11-14 ENCOUNTER — Encounter (HOSPITAL_COMMUNITY)
Admission: RE | Admit: 2012-11-14 | Discharge: 2012-11-14 | Disposition: A | Discharge status: Home / Self Care | Payer: BC Managed Care – PPO | Source: Ambulatory Visit | Attending: Cardiovascular Disease | Admitting: Cardiovascular Disease

## 2012-11-14 ENCOUNTER — Encounter (HOSPITAL_COMMUNITY): Payer: BC Managed Care – PPO

## 2012-11-14 NOTE — Progress Notes (Addendum)
Reviewed home exercise with pt.  Pt plans to continue walking at home for 30-45 minutes, with an extra day of hand weights, 2-4 days/week outside of [facility] for exercise.  Reviewed THR, pulse, RPE, sign and symptoms, NTG use, and when to call 911 or MD.  Pt voiced understanding.  Home exercise was completed 11/12/2012.  Alexia Freestone, MS, ACSM RCEP 3:50 PM 11/14/2012

## 2012-11-16 ENCOUNTER — Encounter (HOSPITAL_COMMUNITY)
Admission: RE | Admit: 2012-11-16 | Discharge: 2012-11-16 | Disposition: A | Discharge status: Home / Self Care | Payer: BC Managed Care – PPO | Source: Ambulatory Visit | Attending: Cardiovascular Disease | Admitting: Cardiovascular Disease

## 2012-11-16 ENCOUNTER — Encounter (HOSPITAL_COMMUNITY): Payer: BC Managed Care – PPO

## 2012-11-19 ENCOUNTER — Encounter (HOSPITAL_COMMUNITY): Payer: BC Managed Care – PPO

## 2012-11-19 ENCOUNTER — Encounter (HOSPITAL_COMMUNITY)
Admission: RE | Admit: 2012-11-19 | Discharge: 2012-11-19 | Disposition: A | Discharge status: Home / Self Care | Payer: BC Managed Care – PPO | Source: Ambulatory Visit | Attending: Cardiovascular Disease | Admitting: Cardiovascular Disease

## 2012-11-21 ENCOUNTER — Encounter (HOSPITAL_COMMUNITY): Payer: BC Managed Care – PPO

## 2012-11-21 ENCOUNTER — Encounter (HOSPITAL_COMMUNITY)
Admission: RE | Admit: 2012-11-21 | Discharge: 2012-11-21 | Disposition: A | Discharge status: Home / Self Care | Payer: BC Managed Care – PPO | Source: Ambulatory Visit | Attending: Cardiovascular Disease | Admitting: Cardiovascular Disease

## 2012-11-23 ENCOUNTER — Encounter (HOSPITAL_COMMUNITY): Payer: BC Managed Care – PPO

## 2012-11-23 ENCOUNTER — Encounter (HOSPITAL_COMMUNITY)
Admission: RE | Admit: 2012-11-23 | Discharge: 2012-11-23 | Disposition: A | Discharge status: Home / Self Care | Payer: BC Managed Care – PPO | Source: Ambulatory Visit | Attending: Cardiovascular Disease | Admitting: Cardiovascular Disease

## 2012-11-26 ENCOUNTER — Encounter (HOSPITAL_COMMUNITY)
Admission: RE | Admit: 2012-11-26 | Discharge: 2012-11-26 | Disposition: A | Discharge status: Home / Self Care | Payer: BC Managed Care – PPO | Source: Ambulatory Visit | Attending: Cardiovascular Disease | Admitting: Cardiovascular Disease

## 2012-11-26 ENCOUNTER — Encounter (HOSPITAL_COMMUNITY): Payer: BC Managed Care – PPO

## 2012-11-27 ENCOUNTER — Encounter: Payer: Self-pay | Admitting: Interventional Cardiology

## 2012-11-28 ENCOUNTER — Encounter (HOSPITAL_COMMUNITY)
Admission: RE | Admit: 2012-11-28 | Discharge: 2012-11-28 | Disposition: A | Discharge status: Home / Self Care | Payer: BC Managed Care – PPO | Source: Ambulatory Visit | Attending: Cardiovascular Disease | Admitting: Cardiovascular Disease

## 2012-11-28 ENCOUNTER — Encounter (HOSPITAL_COMMUNITY): Payer: BC Managed Care – PPO

## 2012-11-30 ENCOUNTER — Encounter (HOSPITAL_COMMUNITY)
Admission: RE | Admit: 2012-11-30 | Discharge: 2012-11-30 | Disposition: A | Discharge status: Home / Self Care | Payer: BC Managed Care – PPO | Source: Ambulatory Visit | Attending: Cardiovascular Disease | Admitting: Cardiovascular Disease

## 2012-11-30 ENCOUNTER — Encounter (HOSPITAL_COMMUNITY): Payer: BC Managed Care – PPO

## 2012-12-03 ENCOUNTER — Encounter (HOSPITAL_COMMUNITY)
Admission: RE | Admit: 2012-12-03 | Discharge: 2012-12-03 | Disposition: A | Discharge status: Home / Self Care | Payer: BC Managed Care – PPO | Source: Ambulatory Visit | Attending: Cardiovascular Disease | Admitting: Cardiovascular Disease

## 2012-12-03 ENCOUNTER — Encounter (HOSPITAL_COMMUNITY): Payer: BC Managed Care – PPO

## 2012-12-03 NOTE — Progress Notes (Signed)
Reginald Myers 57 y.o. male Nutrition Note Spoke with pt.  Nutrition Plan and Nutrition Survey goals reviewed with pt. Pt is following Step 2 of the Therapeutic Lifestyle Changes diet. Pt wants to maintain his wt between 185-190 lb. Pt states he lost 40 lb after his surgery "and I'm happy where I am now." Weight maintenance discussed. Pt is aware of the nutrition classes offered and plans on attending the nutrition classes. Nutrition Diagnosis   Food-and nutrition-related knowledge deficit related to lack of exposure to information as related to diagnosis of: ? CVD   Nutrition RX/ Estimated Daily Nutrition Needs for: wt maintenance 2450-2800 Kcal, 80-90 gm fat, 16-19 gm sat fat, 2.4-2.8 gm trans-fat, <1500 mg sodium   Nutrition Intervention   Pt's individual nutrition plan reviewed with pt.   Benefits of adopting Therapeutic Lifestyle Changes discussed when Medficts reviewed.   Pt to attend the Portion Distortion class   Pt to attend the  ? Nutrition I class                     ? Nutrition II class    Continue client-centered nutrition education by RD, as part of interdisciplinary care. Goal(s)   Pt to identify food quantities necessary to achieve wt maintenance at graduation from cardiac rehab.    Pt to describe the benefit of including fruits, vegetables, whole grains, and low-fat dairy products in a heart healthy meal plan. Monitor and Evaluate progress toward nutrition goal with team. Nutrition Risk: Change to Moderate   Mickle Plumb, M.Ed, RD, LDN, CDE 12/03/2012 9:32 AM

## 2012-12-05 ENCOUNTER — Encounter (HOSPITAL_COMMUNITY)
Admission: RE | Admit: 2012-12-05 | Discharge: 2012-12-05 | Disposition: A | Discharge status: Home / Self Care | Payer: BC Managed Care – PPO | Source: Ambulatory Visit | Attending: Cardiovascular Disease | Admitting: Cardiovascular Disease

## 2012-12-05 ENCOUNTER — Encounter (HOSPITAL_COMMUNITY): Payer: BC Managed Care – PPO

## 2012-12-06 DIAGNOSIS — I4891 Unspecified atrial fibrillation: Secondary | ICD-10-CM

## 2012-12-07 ENCOUNTER — Encounter (HOSPITAL_COMMUNITY)
Admission: RE | Admit: 2012-12-07 | Discharge: 2012-12-07 | Disposition: A | Discharge status: Home / Self Care | Payer: BC Managed Care – PPO | Source: Ambulatory Visit | Attending: Cardiovascular Disease | Admitting: Cardiovascular Disease

## 2012-12-07 ENCOUNTER — Encounter (HOSPITAL_COMMUNITY): Payer: BC Managed Care – PPO

## 2012-12-07 ENCOUNTER — Telehealth: Payer: Self-pay | Admitting: Cardiology

## 2012-12-07 ENCOUNTER — Encounter: Payer: Self-pay | Admitting: Interventional Cardiology

## 2012-12-07 ENCOUNTER — Ambulatory Visit (INDEPENDENT_AMBULATORY_CARE_PROVIDER_SITE_OTHER): Payer: BC Managed Care – PPO | Admitting: Interventional Cardiology

## 2012-12-07 VITALS — BP 116/78 | HR 90 | Ht 73.0 in | Wt 191.0 lb

## 2012-12-07 DIAGNOSIS — I4891 Unspecified atrial fibrillation: Secondary | ICD-10-CM

## 2012-12-07 DIAGNOSIS — I359 Nonrheumatic aortic valve disorder, unspecified: Secondary | ICD-10-CM

## 2012-12-07 MED ORDER — ASPIRIN EC 81 MG PO TBEC: 81.0000 mg | DELAYED_RELEASE_TABLET | Freq: Every day | ORAL | Status: DC

## 2012-12-07 NOTE — Telephone Encounter (Signed)
Adivised pt of results at OV today.  Interpreted by Dr. Eldridge Dace- No Afib

## 2012-12-07 NOTE — Progress Notes (Signed)
Pt has MD appt today.  Rehab report sent with patient for review.

## 2012-12-07 NOTE — Progress Notes (Signed)
Patient ID: Reginald Myers, male   DOB: 1955/05/28, 57 y.o.   MRN: [id number]    [address] [place], [place]  [address] Phone: [phone] Fax:  [phone]  Date:  12/07/2012   ID:  Reginald Myers, DOB January 11, 1956, MRN [id number]  PCP:  Pearla Dubonnet, MD      History of Present Illness: Reginald Myers is a 57 y.o. 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 He has done very well.No CHF sx. He has not felt palpitations.he is exercising regularly at cardiac rehabilitation. He has had no symptoms of atrial fibrillation. No atrial fibrillation noted on his monitor at rehabilitation.    Atrial Fibrillation F/U:  Denies : Chest pain.  Dizziness.  Leg edema.  Orthopnea.  Palpitations.  Syncope.     Wt Readings from Last 3 Encounters:  12/07/12 191 lb (86.637 kg)  10/25/12 187 lb 6.3 oz (85 kg)  10/03/12 212 lb (96.163 kg)     Past Medical History  Diagnosis Date  . Heart murmur   . Dysrhythmia   . Hypertension   . Hyperlipidemia   . Right knee DJD     prior meniscal surgery  . Bell's palsy 09/2009    right-residual weakness with fatique   . Hemorrhoids   . Atrial fibrillation   . Aortic valve disorders   . Orthopnea   . Testicular cancer 1994  . Shortness of breath   . Acute systolic heart failure     Current Outpatient Prescriptions  Medication Sig Dispense Refill  . acetaminophen (TYLENOL) 325 MG tablet Take 650 mg by mouth every 6 (six) hours as needed for pain.      . Rivaroxaban (XARELTO) 20 MG TABS Take 20 mg by mouth daily.       No current facility-administered medications for this visit.    Allergies:    Allergies  Allergen Reactions  . Amiodarone     Intolerance due to nausea    Social History:  The patient  reports that he has never smoked. He has never used smokeless tobacco. He reports that he drinks alcohol. He reports that he does not use illicit drugs.   Family History:  The patient's family  history includes Diabetes in his paternal grandmother; Heart disease in his paternal grandfather.   ROS:  Please see the history of present illness.  No nausea, vomiting.  No fevers, chills.  No focal weakness.  No dysuria.    All other systems reviewed and negative.   PHYSICAL EXAM: VS:  BP 116/78  Pulse 90  Ht 6\' 1"  (1.854 m)  Wt 191 lb (86.637 kg)  BMI 25.2 kg/m2 Well nourished, well developed, in no acute distress HEENT: normal Neck: no JVD, no carotid bruits Cardiac:  normal S1, S2; RRR; soft systolic murmur  Lungs:  clear to auscultation bilaterally, no wheezing, rhonchi or rales Abd: soft, nontender, no hepatomegaly Ext: no edema Skin: warm and dry Neuro:   no focal abnormalities noted  EKG:   NSR, no ST segment changes  ASSESSMENT AND PLAN:  Atrial fibrillation  IMAGING: EKG   Overton,Shana 10/05/2012 12:00:04 PM > Zahriyah Joo,JAY 10/05/2012 12:02:54 PM > acelerated junctional rhythm; NSST   Notes: 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 Hopefully, he will convert on his own. Continue Xarelto for now.  monitor in September showed no occult AFib. stop Xarelto at that point. Start aspirin 81 mg daily.  He would like to stop the anticoagulation.  2. Essential hypertension, benign  Notes: Controlled systolic.  Continue to watch BP inthe next few weeks as activity increases.  lifestyle modifications have helped his blood pressure greatly.   3. Aortic regurgitation  Notes: s/p bioprosthetic valve in the aortic position. He needs SBE prophylaxis. Plan for echocardiogram just to see baseline after valve replacement.   Signed, Fredric Mare, MD, [facility] 12/07/2012 10:24 AM

## 2012-12-07 NOTE — Patient Instructions (Addendum)
Your physician has requested that you have an echocardiogram. Echocardiography is a painless test that uses sound waves to create images of your heart. It provides your doctor with information about the size and shape of your heart and how well your heart's chambers and valves are working. This procedure takes approximately one hour. There are no restrictions for this procedure.  Your physician has recommended you make the following change in your medication:   1. Start Aspirin 81mg  1 tablet by mouth daily.  2. Stop Xarelto 20mg .   Your physician wants you to follow-up in: 6 months with Dr. Eldridge Dace. You will receive a reminder letter in the mail two months in advance. If you don't receive a letter, please call our office to schedule the follow-up appointment.

## 2012-12-10 ENCOUNTER — Encounter (HOSPITAL_COMMUNITY): Payer: BC Managed Care – PPO

## 2012-12-10 ENCOUNTER — Encounter (HOSPITAL_COMMUNITY)
Admission: RE | Admit: 2012-12-10 | Discharge: 2012-12-10 | Disposition: A | Discharge status: Home / Self Care | Payer: BC Managed Care – PPO | Source: Ambulatory Visit | Attending: Cardiovascular Disease | Admitting: Cardiovascular Disease

## 2012-12-10 DIAGNOSIS — I4891 Unspecified atrial fibrillation: Secondary | ICD-10-CM | POA: Insufficient documentation

## 2012-12-10 DIAGNOSIS — I5021 Acute systolic (congestive) heart failure: Secondary | ICD-10-CM | POA: Insufficient documentation

## 2012-12-10 DIAGNOSIS — I2789 Other specified pulmonary heart diseases: Secondary | ICD-10-CM | POA: Insufficient documentation

## 2012-12-10 DIAGNOSIS — I080 Rheumatic disorders of both mitral and aortic valves: Secondary | ICD-10-CM | POA: Insufficient documentation

## 2012-12-10 DIAGNOSIS — I509 Heart failure, unspecified: Secondary | ICD-10-CM | POA: Insufficient documentation

## 2012-12-10 DIAGNOSIS — I079 Rheumatic tricuspid valve disease, unspecified: Secondary | ICD-10-CM | POA: Insufficient documentation

## 2012-12-10 DIAGNOSIS — Z5189 Encounter for other specified aftercare: Secondary | ICD-10-CM | POA: Insufficient documentation

## 2012-12-10 NOTE — Progress Notes (Signed)
Pt arrived at cardiac rehab today reporting his xarelto was discontinued by Dr. Abe People.  Medication list reconciled

## 2012-12-12 ENCOUNTER — Encounter (HOSPITAL_COMMUNITY)
Admission: RE | Admit: 2012-12-12 | Discharge: 2012-12-12 | Disposition: A | Discharge status: Home / Self Care | Payer: BC Managed Care – PPO | Source: Ambulatory Visit | Attending: Cardiovascular Disease | Admitting: Cardiovascular Disease

## 2012-12-12 ENCOUNTER — Encounter (HOSPITAL_COMMUNITY): Payer: BC Managed Care – PPO

## 2012-12-13 ENCOUNTER — Other Ambulatory Visit: Payer: Self-pay

## 2012-12-14 ENCOUNTER — Encounter (HOSPITAL_COMMUNITY): Payer: BC Managed Care – PPO

## 2012-12-14 ENCOUNTER — Encounter (HOSPITAL_COMMUNITY)
Admission: RE | Admit: 2012-12-14 | Discharge: 2012-12-14 | Disposition: A | Discharge status: Home / Self Care | Payer: BC Managed Care – PPO | Source: Ambulatory Visit | Attending: Cardiovascular Disease | Admitting: Cardiovascular Disease

## 2012-12-17 ENCOUNTER — Encounter (HOSPITAL_COMMUNITY)
Admission: RE | Admit: 2012-12-17 | Discharge: 2012-12-17 | Disposition: A | Discharge status: Home / Self Care | Payer: BC Managed Care – PPO | Source: Ambulatory Visit | Attending: Cardiovascular Disease | Admitting: Cardiovascular Disease

## 2012-12-17 ENCOUNTER — Encounter (HOSPITAL_COMMUNITY): Payer: BC Managed Care – PPO

## 2012-12-19 ENCOUNTER — Encounter (HOSPITAL_COMMUNITY)
Admission: RE | Admit: 2012-12-19 | Discharge: 2012-12-19 | Disposition: A | Discharge status: Home / Self Care | Payer: BC Managed Care – PPO | Source: Ambulatory Visit | Attending: Cardiovascular Disease | Admitting: Cardiovascular Disease

## 2012-12-19 ENCOUNTER — Encounter (HOSPITAL_COMMUNITY): Payer: BC Managed Care – PPO

## 2012-12-21 ENCOUNTER — Encounter (HOSPITAL_COMMUNITY): Payer: BC Managed Care – PPO

## 2012-12-21 ENCOUNTER — Encounter (HOSPITAL_COMMUNITY)
Admission: RE | Admit: 2012-12-21 | Discharge: 2012-12-21 | Disposition: A | Discharge status: Home / Self Care | Payer: BC Managed Care – PPO | Source: Ambulatory Visit | Attending: Cardiovascular Disease | Admitting: Cardiovascular Disease

## 2012-12-24 ENCOUNTER — Encounter (HOSPITAL_COMMUNITY)
Admission: RE | Admit: 2012-12-24 | Discharge: 2012-12-24 | Disposition: A | Discharge status: Home / Self Care | Payer: BC Managed Care – PPO | Source: Ambulatory Visit | Attending: Cardiovascular Disease | Admitting: Cardiovascular Disease

## 2012-12-24 ENCOUNTER — Encounter (HOSPITAL_COMMUNITY): Payer: BC Managed Care – PPO

## 2012-12-25 ENCOUNTER — Other Ambulatory Visit: Payer: Self-pay | Admitting: *Deleted

## 2012-12-25 DIAGNOSIS — I4891 Unspecified atrial fibrillation: Secondary | ICD-10-CM

## 2012-12-25 DIAGNOSIS — Z79899 Other long term (current) drug therapy: Secondary | ICD-10-CM

## 2012-12-26 ENCOUNTER — Encounter (HOSPITAL_COMMUNITY): Payer: BC Managed Care – PPO

## 2012-12-26 ENCOUNTER — Encounter (HOSPITAL_COMMUNITY)
Admission: RE | Admit: 2012-12-26 | Discharge: 2012-12-26 | Disposition: A | Discharge status: Home / Self Care | Payer: BC Managed Care – PPO | Source: Ambulatory Visit | Attending: Cardiovascular Disease | Admitting: Cardiovascular Disease

## 2012-12-26 ENCOUNTER — Ambulatory Visit (HOSPITAL_COMMUNITY): Payer: BC Managed Care – PPO | Attending: Interventional Cardiology | Admitting: Cardiology

## 2012-12-26 DIAGNOSIS — I359 Nonrheumatic aortic valve disorder, unspecified: Secondary | ICD-10-CM

## 2012-12-26 DIAGNOSIS — I10 Essential (primary) hypertension: Secondary | ICD-10-CM | POA: Insufficient documentation

## 2012-12-26 DIAGNOSIS — Z954 Presence of other heart-valve replacement: Secondary | ICD-10-CM | POA: Insufficient documentation

## 2012-12-26 DIAGNOSIS — E785 Hyperlipidemia, unspecified: Secondary | ICD-10-CM | POA: Insufficient documentation

## 2012-12-26 DIAGNOSIS — I079 Rheumatic tricuspid valve disease, unspecified: Secondary | ICD-10-CM | POA: Insufficient documentation

## 2012-12-26 DIAGNOSIS — Z952 Presence of prosthetic heart valve: Secondary | ICD-10-CM

## 2012-12-26 NOTE — Progress Notes (Signed)
Echo performed. 

## 2012-12-27 ENCOUNTER — Telehealth: Payer: Self-pay | Admitting: Interventional Cardiology

## 2012-12-27 NOTE — Telephone Encounter (Signed)
Follow up      Pt returning your call please call him back on cell.

## 2012-12-27 NOTE — Telephone Encounter (Signed)
Returned pt's call.

## 2012-12-28 ENCOUNTER — Encounter (HOSPITAL_COMMUNITY): Payer: BC Managed Care – PPO

## 2012-12-28 ENCOUNTER — Encounter (HOSPITAL_COMMUNITY)
Admission: RE | Admit: 2012-12-28 | Discharge: 2012-12-28 | Disposition: A | Discharge status: Home / Self Care | Payer: BC Managed Care – PPO | Source: Ambulatory Visit | Attending: Cardiovascular Disease | Admitting: Cardiovascular Disease

## 2012-12-31 ENCOUNTER — Encounter (HOSPITAL_COMMUNITY): Payer: BC Managed Care – PPO

## 2012-12-31 ENCOUNTER — Encounter (HOSPITAL_COMMUNITY)
Admission: RE | Admit: 2012-12-31 | Discharge: 2012-12-31 | Disposition: A | Discharge status: Home / Self Care | Payer: BC Managed Care – PPO | Source: Ambulatory Visit | Attending: Cardiovascular Disease | Admitting: Cardiovascular Disease

## 2013-01-02 ENCOUNTER — Encounter (HOSPITAL_COMMUNITY): Payer: BC Managed Care – PPO

## 2013-01-04 ENCOUNTER — Encounter (HOSPITAL_COMMUNITY): Payer: BC Managed Care – PPO

## 2013-01-07 ENCOUNTER — Encounter (HOSPITAL_COMMUNITY): Payer: BC Managed Care – PPO

## 2013-01-07 ENCOUNTER — Encounter (HOSPITAL_COMMUNITY)
Admission: RE | Admit: 2013-01-07 | Discharge: 2013-01-07 | Disposition: A | Discharge status: Home / Self Care | Payer: BC Managed Care – PPO | Source: Ambulatory Visit | Attending: Cardiovascular Disease | Admitting: Cardiovascular Disease

## 2013-01-07 DIAGNOSIS — I080 Rheumatic disorders of both mitral and aortic valves: Secondary | ICD-10-CM | POA: Insufficient documentation

## 2013-01-07 DIAGNOSIS — Z5189 Encounter for other specified aftercare: Secondary | ICD-10-CM | POA: Insufficient documentation

## 2013-01-07 DIAGNOSIS — I4891 Unspecified atrial fibrillation: Secondary | ICD-10-CM | POA: Insufficient documentation

## 2013-01-07 DIAGNOSIS — I5021 Acute systolic (congestive) heart failure: Secondary | ICD-10-CM | POA: Insufficient documentation

## 2013-01-07 DIAGNOSIS — I509 Heart failure, unspecified: Secondary | ICD-10-CM | POA: Insufficient documentation

## 2013-01-07 DIAGNOSIS — I2789 Other specified pulmonary heart diseases: Secondary | ICD-10-CM | POA: Insufficient documentation

## 2013-01-07 DIAGNOSIS — I079 Rheumatic tricuspid valve disease, unspecified: Secondary | ICD-10-CM | POA: Insufficient documentation

## 2013-01-09 ENCOUNTER — Encounter (HOSPITAL_COMMUNITY): Payer: BC Managed Care – PPO

## 2013-01-09 ENCOUNTER — Encounter (HOSPITAL_COMMUNITY)
Admission: RE | Admit: 2013-01-09 | Discharge: 2013-01-09 | Disposition: A | Discharge status: Home / Self Care | Payer: BC Managed Care – PPO | Source: Ambulatory Visit | Attending: Cardiovascular Disease | Admitting: Cardiovascular Disease

## 2013-01-11 ENCOUNTER — Encounter (HOSPITAL_COMMUNITY)
Admission: RE | Admit: 2013-01-11 | Discharge: 2013-01-11 | Disposition: A | Discharge status: Home / Self Care | Payer: BC Managed Care – PPO | Source: Ambulatory Visit | Attending: Cardiovascular Disease | Admitting: Cardiovascular Disease

## 2013-01-11 ENCOUNTER — Encounter (HOSPITAL_COMMUNITY): Payer: BC Managed Care – PPO

## 2013-01-14 ENCOUNTER — Other Ambulatory Visit: Payer: BC Managed Care – PPO

## 2013-01-14 ENCOUNTER — Encounter (HOSPITAL_COMMUNITY)
Admission: RE | Admit: 2013-01-14 | Discharge: 2013-01-14 | Disposition: A | Discharge status: Home / Self Care | Payer: BC Managed Care – PPO | Source: Ambulatory Visit | Attending: Cardiovascular Disease | Admitting: Cardiovascular Disease

## 2013-01-14 ENCOUNTER — Encounter (HOSPITAL_COMMUNITY): Payer: BC Managed Care – PPO

## 2013-01-16 ENCOUNTER — Encounter (HOSPITAL_COMMUNITY): Payer: BC Managed Care – PPO

## 2013-01-16 ENCOUNTER — Encounter (HOSPITAL_COMMUNITY)
Admission: RE | Admit: 2013-01-16 | Discharge: 2013-01-16 | Disposition: A | Discharge status: Home / Self Care | Payer: BC Managed Care – PPO | Source: Ambulatory Visit | Attending: Cardiovascular Disease | Admitting: Cardiovascular Disease

## 2013-01-18 ENCOUNTER — Encounter (HOSPITAL_COMMUNITY)
Admission: RE | Admit: 2013-01-18 | Discharge: 2013-01-18 | Disposition: A | Discharge status: Home / Self Care | Payer: BC Managed Care – PPO | Source: Ambulatory Visit | Attending: Cardiovascular Disease | Admitting: Cardiovascular Disease

## 2013-01-18 ENCOUNTER — Encounter (HOSPITAL_COMMUNITY): Payer: BC Managed Care – PPO

## 2013-01-21 ENCOUNTER — Encounter (HOSPITAL_COMMUNITY)
Admission: RE | Admit: 2013-01-21 | Discharge: 2013-01-21 | Disposition: A | Discharge status: Home / Self Care | Payer: BC Managed Care – PPO | Source: Ambulatory Visit | Attending: Cardiovascular Disease | Admitting: Cardiovascular Disease

## 2013-01-21 ENCOUNTER — Encounter (HOSPITAL_COMMUNITY): Payer: BC Managed Care – PPO

## 2013-01-23 ENCOUNTER — Encounter (HOSPITAL_COMMUNITY): Payer: BC Managed Care – PPO

## 2013-01-23 ENCOUNTER — Encounter (HOSPITAL_COMMUNITY)
Admission: RE | Admit: 2013-01-23 | Discharge: 2013-01-23 | Disposition: A | Discharge status: Home / Self Care | Payer: BC Managed Care – PPO | Source: Ambulatory Visit | Attending: Cardiovascular Disease | Admitting: Cardiovascular Disease

## 2013-01-25 ENCOUNTER — Encounter (HOSPITAL_COMMUNITY): Payer: Self-pay

## 2013-01-25 ENCOUNTER — Encounter (HOSPITAL_COMMUNITY): Payer: BC Managed Care – PPO

## 2013-01-25 ENCOUNTER — Encounter (HOSPITAL_COMMUNITY)
Admission: RE | Admit: 2013-01-25 | Discharge: 2013-01-25 | Disposition: A | Discharge status: Home / Self Care | Payer: BC Managed Care – PPO | Source: Ambulatory Visit | Attending: Cardiovascular Disease | Admitting: Cardiovascular Disease

## 2013-01-25 NOTE — Progress Notes (Signed)
Pt graduated from cardiac rehab program today.  Medication list reconciled.  PHQ9 score-0  .  Pt has made significant lifestyle changes and should be commended for his success. Pt plans to continue exercise on his own at local gym.  

## 2013-01-28 ENCOUNTER — Encounter (HOSPITAL_COMMUNITY): Payer: BC Managed Care – PPO

## 2013-01-30 ENCOUNTER — Encounter (HOSPITAL_COMMUNITY): Payer: BC Managed Care – PPO

## 2013-02-01 ENCOUNTER — Encounter (HOSPITAL_COMMUNITY): Payer: BC Managed Care – PPO

## 2013-03-29 ENCOUNTER — Encounter: Payer: Self-pay | Admitting: Interventional Cardiology

## 2013-05-08 ENCOUNTER — Encounter: Payer: Self-pay | Admitting: Interventional Cardiology

## 2013-07-05 ENCOUNTER — Telehealth: Payer: Self-pay | Admitting: Interventional Cardiology

## 2013-07-05 MED ORDER — AMOXICILLIN 500 MG PO CAPS: ORAL_CAPSULE | ORAL | Status: DC

## 2013-07-05 NOTE — Telephone Encounter (Signed)
Pt aware.

## 2013-07-05 NOTE — Telephone Encounter (Signed)
New problem    Pt has a Dentist appt 6/3 and needs an antibiotic called in to [facility]  Please.

## 2013-07-05 NOTE — Telephone Encounter (Signed)
REFILLED

## 2013-07-08 ENCOUNTER — Ambulatory Visit: Payer: BC Managed Care – PPO | Admitting: Interventional Cardiology

## 2013-08-13 IMAGING — CR DG CHEST 2V
2 series · 2 of 2 positions shown · non-contrast
Comparison: None.

CLINICAL DATA: Preop for cardiac surgery.

CHEST - 2 VIEW

[w chest pa]
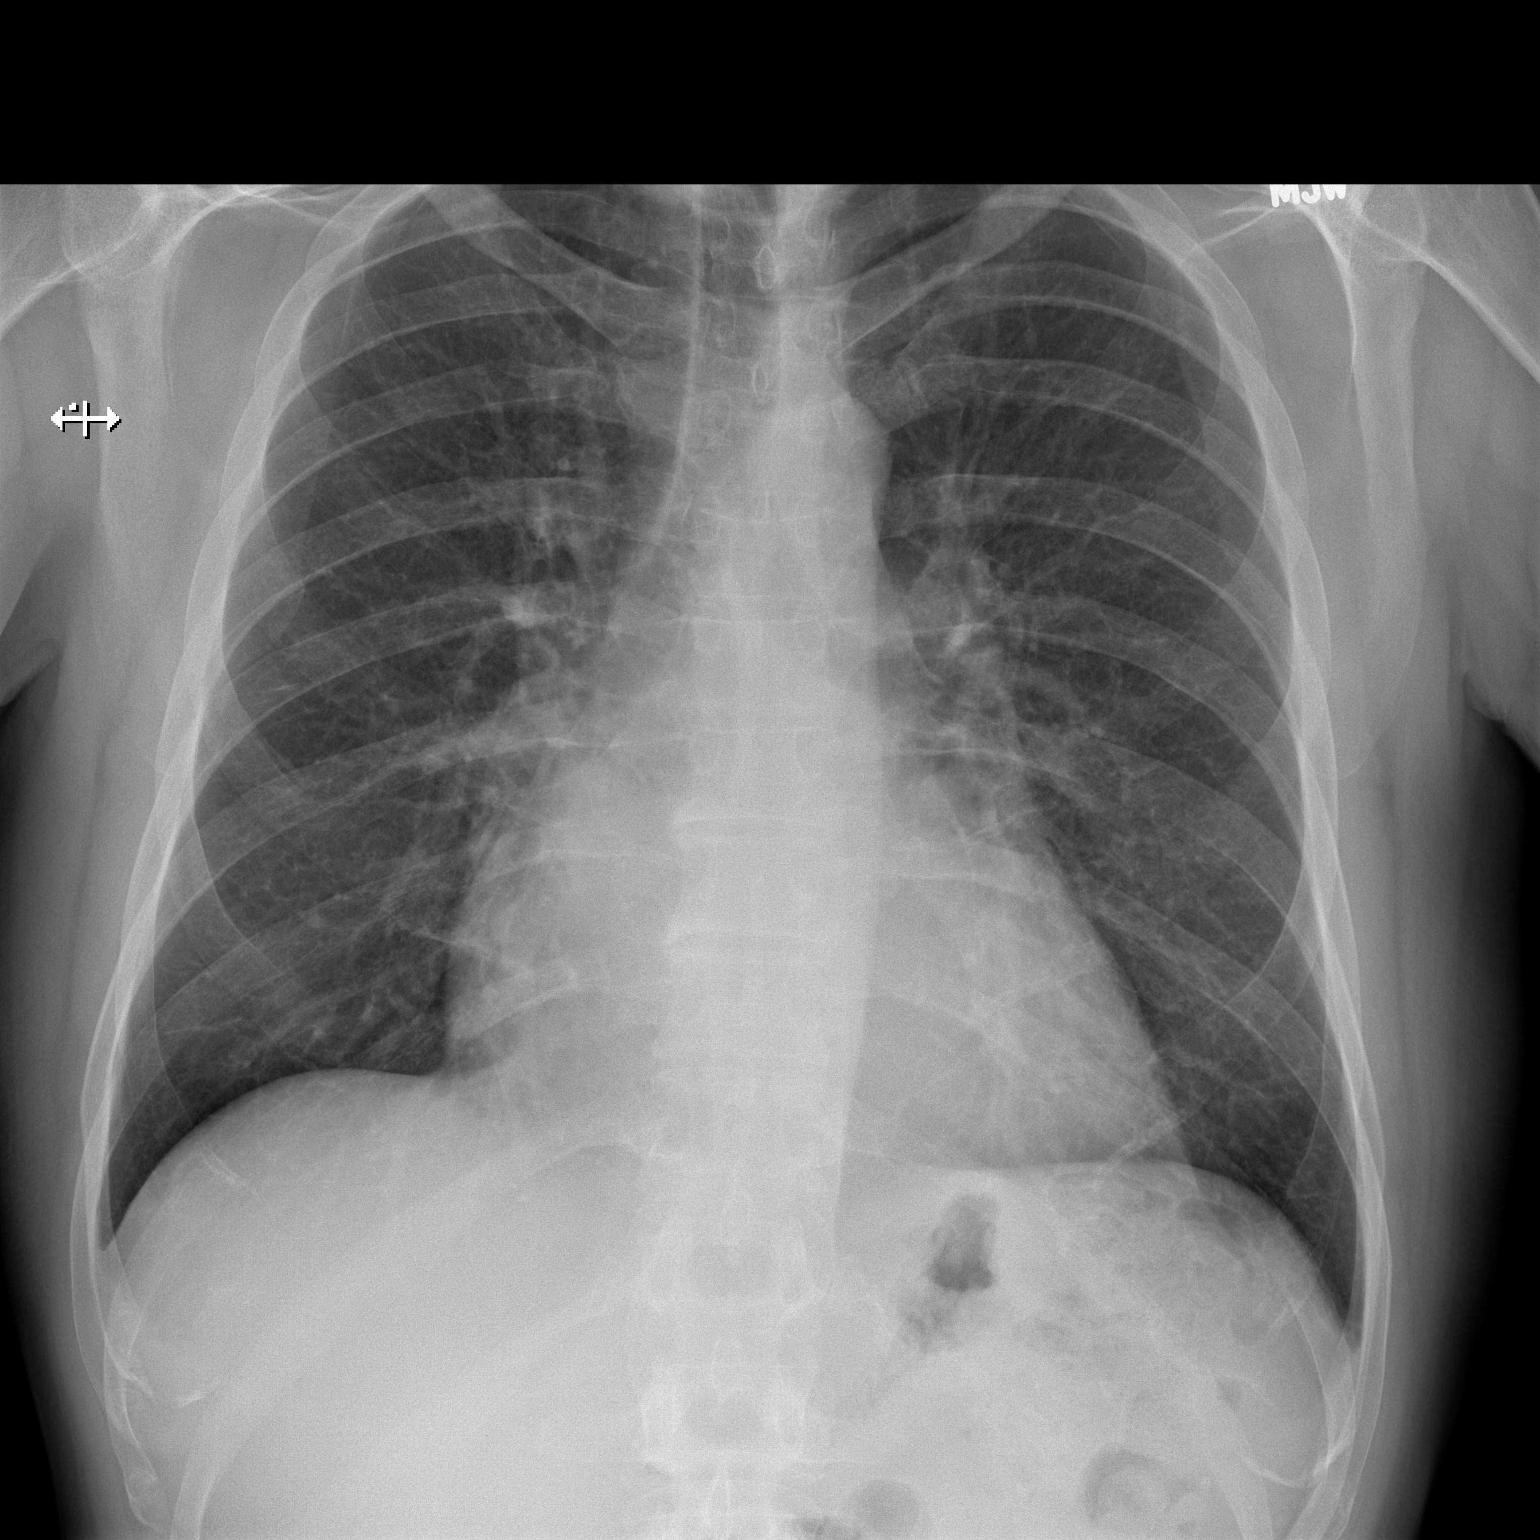

[w chest lat]
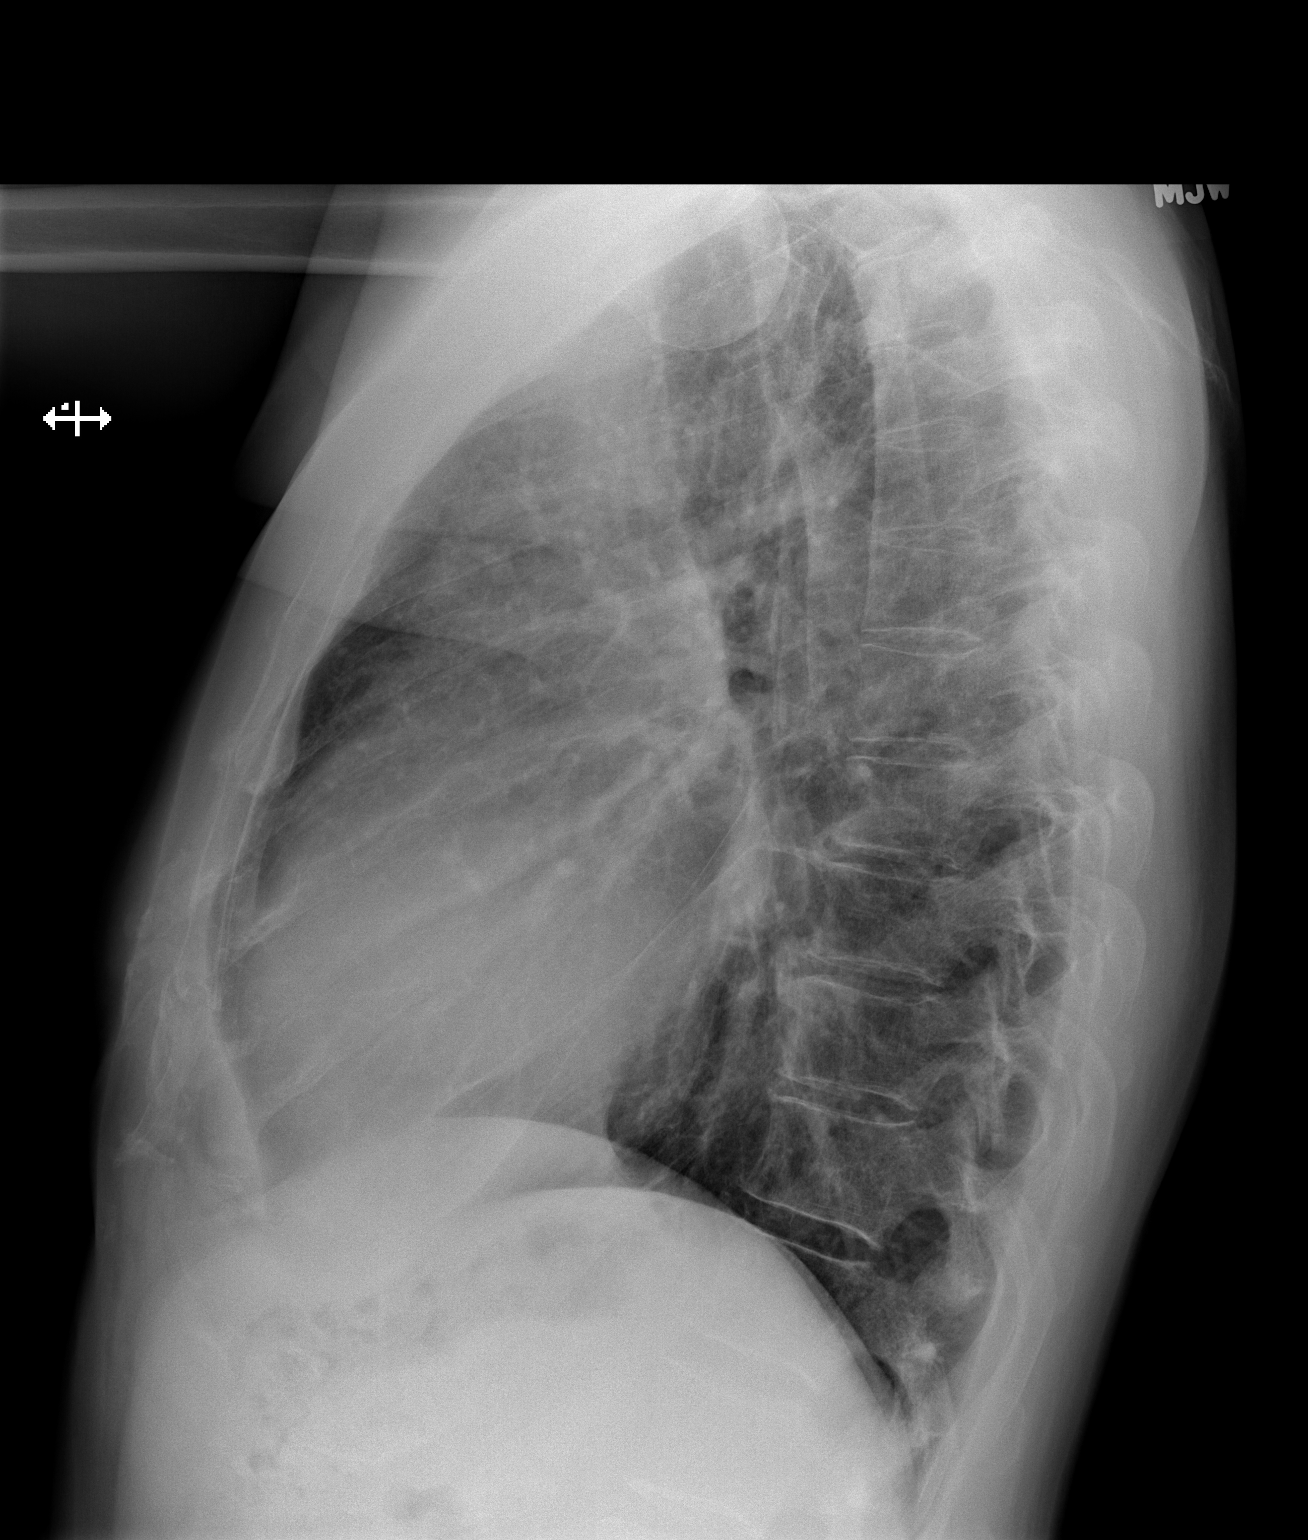

[2 of 2 positions shown; findings below may reference images not displayed]

FINDINGS: The cardiac silhouette is mildly enlarged.  No
mediastinal or hilar masses are noted.  The mediastinum is normal
in contour caliber.  The lungs are clear.  No pleural effusion or
pneumothorax.  The bony thorax is intact.
IMPRESSION: Cardiomegaly.  No acute cardiopulmonary disease.

## 2013-08-15 ENCOUNTER — Encounter: Payer: Self-pay | Admitting: Interventional Cardiology

## 2013-08-15 ENCOUNTER — Ambulatory Visit (INDEPENDENT_AMBULATORY_CARE_PROVIDER_SITE_OTHER): Payer: No Typology Code available for payment source | Admitting: Interventional Cardiology

## 2013-08-15 VITALS — BP 150/100 | HR 84 | Ht 73.0 in | Wt 202.8 lb

## 2013-08-15 DIAGNOSIS — I480 Paroxysmal atrial fibrillation: Secondary | ICD-10-CM

## 2013-08-15 DIAGNOSIS — Z954 Presence of other heart-valve replacement: Secondary | ICD-10-CM

## 2013-08-15 DIAGNOSIS — I4891 Unspecified atrial fibrillation: Secondary | ICD-10-CM

## 2013-08-15 DIAGNOSIS — Z952 Presence of prosthetic heart valve: Secondary | ICD-10-CM

## 2013-08-15 NOTE — Progress Notes (Signed)
Patient ID: Reginald Myers, male   DOB: 28-Feb-1955, 58 y.o.   MRN: [id number] Patient ID: NOTNAMED Reginald Myers, male   DOB: 1956/01/03, 58 y.o.   MRN: [id number]    [address], [place], [place]  [address] Phone: [phone] Fax:  [phone]  Date:  08/15/2013   ID:  Reginald Myers, DOB Jan 14, 1956, MRN [id number]  PCP:  Henrine Screws, MD      History of Present Illness: Reginald Myers is a 58 y.o. 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 He has done very well.No CHF sx. He has not felt palpitations.he is exercising regularly at cardiac rehabilitation. He has had no symptoms of atrial fibrillation. No atrial fibrillation noted on his monitor at rehabilitation.    Atrial Fibrillation F/U:  Denies : Chest pain.  Dizziness.  Leg edema.  Orthopnea.  Palpitations.  Syncope.     Wt Readings from Last 3 Encounters:  08/15/13 202 lb 12.8 oz (91.989 kg)  12/07/12 191 lb (86.637 kg)  10/25/12 187 lb 6.3 oz (85 kg)     Past Medical History  Diagnosis Date  . Heart murmur   . Dysrhythmia   . Hypertension   . Hyperlipidemia   . Right knee DJD     prior meniscal surgery  . Bell's palsy 09/2009    right-residual weakness with fatique   . Hemorrhoids   . Atrial fibrillation   . Aortic valve disorders   . Orthopnea   . Testicular cancer 1994  . Shortness of breath   . Acute systolic heart failure     Current Outpatient Prescriptions  Medication Sig Dispense Refill  . acetaminophen (TYLENOL) 325 MG tablet Take 650 mg by mouth every 6 (six) hours as needed for pain.      Marland Kitchen amoxicillin (AMOXIL) 500 MG capsule 4 CAPSULES 1 HR PRIOR TO DENTAL VISITS  4 capsule  2  . aspirin EC 81 MG tablet Take 1 tablet (81 mg total) by mouth daily.       No current facility-administered medications for this visit.    Allergies:    Allergies  Allergen Reactions  . Amiodarone     Intolerance due to nausea    Social History:  The patient  reports  that he has never smoked. He has never used smokeless tobacco. He reports that he drinks alcohol. He reports that he does not use illicit drugs.   Family History:  The patient's family history includes Diabetes in his paternal grandmother; Heart disease in his paternal grandfather; Pulmonary fibrosis in his father.   ROS:  Please see the history of present illness.  No nausea, vomiting.  No fevers, chills.  No focal weakness.  No dysuria.    All other systems reviewed and negative.   PHYSICAL EXAM: VS:  BP 150/100  Pulse 84  Ht 6\' 1"  (1.854 m)  Wt 202 lb 12.8 oz (91.989 kg)  BMI 26.76 kg/m2 Well nourished, well developed, in no acute distress HEENT: normal Neck: no JVD, no carotid bruits Cardiac:  normal S1, S2; RRR; soft systolic murmur  Lungs:  clear to auscultation bilaterally, no wheezing, rhonchi or rales Abd: soft, nontender, no hepatomegaly Ext: no edema Skin: warm and dry Neuro:   no focal abnormalities noted  EKG:   NSR, no ST segment changes  ASSESSMENT AND PLAN:  Atrial fibrillation       Notes: Normal rhythm. Continue aspirin for now.  monitor in  September showed no occult AFib. stopped Xarelto at that point. Started aspirin 81 mg daily.  He stopped the anticoagulation.   2. Essential hypertension, benign  Notes: Controlled systolic at home.  Continue to watch BP inthe next few weeks as activity increases.  lifestyle modifications have helped his blood pressure greatly.   3. Aortic regurgitation  Notes: s/p bioprosthetic valve in the aortic position. He needs SBE prophylaxis.   Signed, Mina Marble, MD, [facility] 08/15/2013 2:41 PM

## 2013-08-15 NOTE — Patient Instructions (Signed)
Your physician recommends that you continue on your current medications as directed. Please refer to the Current Medication list given to you today.  Your physician wants you to follow-up in: 6 months with Dr. Varanasi.  You will receive a reminder letter in the mail two months in advance. If you don't receive a letter, please call our office to schedule the follow-up appointment.  

## 2014-01-16 ENCOUNTER — Encounter (HOSPITAL_COMMUNITY): Payer: Self-pay | Admitting: Interventional Cardiology

## 2015-05-12 ENCOUNTER — Other Ambulatory Visit: Payer: Self-pay | Admitting: *Deleted

## 2015-05-13 NOTE — Telephone Encounter (Signed)
The pt has not seen Dr [name] since 08/15/13. Please refer to the Pts PCP. Thanks.

## 2015-07-09 ENCOUNTER — Ambulatory Visit: Payer: No Typology Code available for payment source | Admitting: Interventional Cardiology

## 2015-09-08 ENCOUNTER — Encounter: Payer: Self-pay | Admitting: Interventional Cardiology

## 2015-09-08 ENCOUNTER — Ambulatory Visit (INDEPENDENT_AMBULATORY_CARE_PROVIDER_SITE_OTHER): Payer: BLUE CROSS/BLUE SHIELD | Admitting: Interventional Cardiology

## 2015-09-08 VITALS — BP 130/90 | HR 65 | Ht 73.0 in | Wt 209.2 lb

## 2015-09-08 DIAGNOSIS — Z9889 Other specified postprocedural states: Secondary | ICD-10-CM

## 2015-09-08 DIAGNOSIS — I359 Nonrheumatic aortic valve disorder, unspecified: Secondary | ICD-10-CM | POA: Diagnosis not present

## 2015-09-08 DIAGNOSIS — Z954 Presence of other heart-valve replacement: Secondary | ICD-10-CM

## 2015-09-08 DIAGNOSIS — Z952 Presence of prosthetic heart valve: Secondary | ICD-10-CM

## 2015-09-08 DIAGNOSIS — Z8679 Personal history of other diseases of the circulatory system: Secondary | ICD-10-CM

## 2015-09-08 DIAGNOSIS — I480 Paroxysmal atrial fibrillation: Secondary | ICD-10-CM | POA: Diagnosis not present

## 2015-09-08 NOTE — Progress Notes (Signed)
Cardiology Office Note   Date:  09/08/2015   ID:  Reginald Myers, DOB 1955/10/08, MRN [id number]  PCP:  Henrine Screws, MD    No chief complaint on file. f/u valvular disease   Wt Readings from Last 3 Encounters:  09/08/15 209 lb 3.2 oz (94.9 kg)  08/15/13 202 lb 12.8 oz (92 kg)  12/07/12 191 lb (86.6 kg)       History of Present Illness: Reginald Myers is a 60 y.o. male  male who had an AVR , mitral valve ring placed and Maze procedure in 2014. He has done very well.No CHF sx. He has not felt palpitations.  Finished cardiac rehabilitation. He has had no symptoms of atrial fibrillation. No atrial fibrillation noted on his monitor at rehabilitation.    Atrial Fibrillation F/U:  Denies : Chest pain.  Dizziness.  Leg edema.  Orthopnea.  Palpitations.  Syncope.   Exercises 4-5 x/week.  Both cardio and weights.  He does yard work.  No very heavy lifting.  No sx of his AFib.he gym is controlled.  Using an antibiotic before dental work.  He eats healthy.   Past Medical History:  Diagnosis Date  . Acute systolic heart failure ([place])   . Aortic valve disorders   . Atrial fibrillation ([place])   . Bell's palsy 09/2009   right-residual weakness with fatique   . Dysrhythmia   . Heart murmur   . Hemorrhoids   . Hyperlipidemia   . Hypertension   . Orthopnea   . Right knee DJD    prior meniscal surgery  . Shortness of breath   . Testicular cancer ([place]) 1994    Past Surgical History:  Procedure Laterality Date  . AORTIC VALVE REPLACEMENT N/A 09/07/2012   Procedure: AORTIC VALVE REPLACEMENT (AVR);  Surgeon: Gaye Pollack, MD;  Location: [place];  Service: Open Heart Surgery;  Laterality: N/A;  . CARDIAC CATHETERIZATION    . CARDIOVERSION N/A 07/13/2012   Procedure: CARDIOVERSION;  Surgeon: [name], MD;  Location: [facility] ENDOSCOPY;  Service: Cardiovascular;  Laterality: N/A;  . CARDIOVERSION N/A 08/08/2012   Procedure: CARDIOVERSION;  Surgeon: [name], MD;  Location: [place];  Service: Cardiovascular;  Laterality: N/A;  . CLIPPING OF ATRIAL APPENDAGE N/A 09/07/2012   Procedure: CLIPPING OF ATRIAL APPENDAGE;  Surgeon: Gaye Pollack, MD;  Location: [place];  Service: Open Heart Surgery;  Laterality: N/A;  . INTRAOPERATIVE TRANSESOPHAGEAL ECHOCARDIOGRAM N/A 09/07/2012   Procedure: INTRAOPERATIVE TRANSESOPHAGEAL ECHOCARDIOGRAM;  Surgeon: Gaye Pollack, MD;  Location: [facility] OR;  Service: Open Heart Surgery;  Laterality: N/A;  . KNEE ARTHROSCOPY W/ MENISCAL REPAIR Right 1988  . LEFT AND RIGHT HEART CATHETERIZATION WITH CORONARY ANGIOGRAM N/A 08/15/2012   Procedure: LEFT AND RIGHT HEART CATHETERIZATION WITH CORONARY ANGIOGRAM;  Surgeon: [name], MD;  Location: [facility] CATH LAB;  Service: Cardiovascular;  Laterality: N/A;  . MAZE N/A 09/07/2012   Procedure: MAZE;  Surgeon: Gaye Pollack, MD;  Location: [place];  Service: Open Heart Surgery;  Laterality: N/A;  . MITRAL VALVE REPAIR N/A 09/07/2012   Procedure: MITRAL VALVE REPAIR (MVR);  Surgeon: Gaye Pollack, MD;  Location: [place];  Service: Open Heart Surgery;  Laterality: N/A;  . ORCHIECTOMY Left 1993  . TEE WITHOUT CARDIOVERSION N/A 07/13/2012   Procedure: TRANSESOPHAGEAL ECHOCARDIOGRAM (TEE);  Surgeon: [name], MD;  Location: [facility] ENDOSCOPY;  Service: Cardiovascular;  Laterality: N/A;     Current Outpatient Prescriptions  Medication Sig  Dispense Refill  . aspirin EC 81 MG tablet Take 1 tablet (81 mg total) by mouth daily.     No current facility-administered medications for this visit.     Allergies:   Amiodarone    Social History:  The patient  reports that he has never smoked. He has never used smokeless tobacco. He reports that he drinks alcohol. He reports that he does not use drugs.   Family History:  The patient's family history includes Diabetes in his paternal grandmother; Heart disease in his paternal grandfather; Pulmonary fibrosis in his father.    ROS:   Please see the history of present illness.   Otherwise, review of systems are positive for .   All other systems are reviewed and negative.    PHYSICAL EXAM: VS:  BP 130/90   Pulse 65   Ht 6\' 1"  (1.854 m)   Wt 209 lb 3.2 oz (94.9 kg)   BMI 27.60 kg/m  , BMI Body mass index is 27.6 kg/m. GEN: Well nourished, well developed, in no acute distress  HEENT: normal  Neck: no JVD, carotid bruits, or masses Cardiac: [id number] systolic murmur, no rubs, or gallops,no edema  Respiratory:  clear to auscultation bilaterally, normal work of breathing GI: soft, nontender, nondistended, + BS MS: no deformity or atrophy  Skin: warm and dry, no rash Neuro:  Strength and sensation are intact Psych: euthymic mood, full affect   EKG:   The ekg ordered today demonstrates NSR, no ST segment changes   Recent Labs: No results found for requested labs within last 8760 hours.   Lipid Panel No results found for: CHOL, TRIG, HDL, CHOLHDL, VLDL, LDLCALC, LDLDIRECT   Other studies Reviewed: Additional studies/ records that were reviewed today with results demonstrating: 2014 echo.   ASSESSMENT AND PLAN:  1. S/pAVR: s/p bioprosthetic valve in the aortic position. He needs SBE prophylaxis. 2. S/p MV repair as well for MR.   3. HTN: Not checking at home.  Meds have been stopped.  He should check BP 2x/month after resting given borderline diastolic. 4. PAF: s/p Maze procedure.  No AFib on monitor in September 2014.  Anticoagulation was stopped in 2014. 5. COntinue healthy lifestyle.     Current medicines are reviewed at length with the patient today.  The patient concerns regarding his medicines were addressed.  The following changes have been made:  No change  Labs/ tests ordered today include:  No orders of the defined types were placed in this encounter.   Recommend 150 minutes/week of aerobic exercise Low fat, low carb, high fiber diet recommended  Disposition:   FU in 1  year   Signed, Larae Grooms, MD  09/08/2015 8:42 AM    [facility] [address], [place], [place]  [address] Phone: [phone]; Fax: [phone]

## 2015-09-08 NOTE — Patient Instructions (Signed)

## 2015-11-22 DIAGNOSIS — Z23 Encounter for immunization: Secondary | ICD-10-CM | POA: Diagnosis not present

## 2016-11-20 DIAGNOSIS — Z23 Encounter for immunization: Secondary | ICD-10-CM | POA: Diagnosis not present

## 2017-05-15 ENCOUNTER — Telehealth: Payer: Self-pay | Admitting: Interventional Cardiology

## 2017-05-15 DIAGNOSIS — H5213 Myopia, bilateral: Secondary | ICD-10-CM | POA: Diagnosis not present

## 2017-05-15 NOTE — Telephone Encounter (Signed)
New message    Pt c/o BP issue: STAT if pt c/o blurred vision, one-sided weakness or slurred speech  1. What are your last 5 BP readings? 160/9?, no other readings  2. Are you having any other symptoms (ex. Dizziness, headache, blurred vision, passed out)? NO  3. What is your BP issue? BP checked at eye appt today. Patient feels BP too high

## 2017-05-15 NOTE — Telephone Encounter (Signed)
Left message detailed message that patient could call his PCP about his BP, and to give our office a call back if he had any other questions. Patient has not been seen since 2017. Will forward to Dr. Hassell Done nurse.

## 2017-05-16 DIAGNOSIS — R030 Elevated blood-pressure reading, without diagnosis of hypertension: Secondary | ICD-10-CM | POA: Diagnosis not present

## 2017-05-17 ENCOUNTER — Encounter: Payer: Self-pay | Admitting: Interventional Cardiology

## 2017-05-18 NOTE — Telephone Encounter (Signed)
Called to follow-up with patient to be sure that he went to see his PCP regarding his BP issues. Patient states that he went to see Dr. Inda Merlin on Tuesday and his BP was around 190/95 so he started him on amlodipine-valsartan 5-160 mg QD (updated med list). He states that he is going back for a nurse visit recheck of his BP tomorrow. Patient has an OV scheduled with JV on 4/16 for overdue f/u. Patient wishes to keep this appointment.

## 2017-05-22 NOTE — Progress Notes (Signed)
Cardiology Office Note   Date:  05/23/2017   ID:  Reginald Myers, DOB Jan 14, 1956, MRN [id number]  PCP:  Josetta Huddle, MD    No chief complaint on file.  F/u AVR  Wt Readings from Last 3 Encounters:  05/23/17 210 lb 1.9 oz (95.3 kg)  09/08/15 209 lb 3.2 oz (94.9 kg)  08/15/13 202 lb 12.8 oz (92 kg)       History of Present Illness: Reginald Myers is a 62 y.o. male  who had an AVR , mitral valve ring placed and Maze procedure in 2014.  He was recently started on some HTN meds.  BP was 562 systolic at Dr. Inda Merlin office. He had not been eating well since 11/18 and he thinks the BP is the result.  He was started on Exforge 5/160 mg and feels somewhat lightheaded.    Denies : Chest pain. Dizziness. Leg edema. Nitroglycerin use. Orthopnea. Palpitations. Paroxysmal nocturnal dyspnea. Shortness of breath. Syncope.   He has improved his diet and exercised more recently to help with the BP.   He feels that his thinking and steadiness were decreased with the medicines.    BP in the office today is 138/74 by my check.      Past Medical History:  Diagnosis Date  . Acute systolic heart failure ([place])   . Aortic valve disorders   . Atrial fibrillation ([place])   . Bell's palsy 09/2009   right-residual weakness with fatique   . Dysrhythmia   . Heart murmur   . Hemorrhoids   . Hyperlipidemia   . Hypertension   . Orthopnea   . Right knee DJD    prior meniscal surgery  . Shortness of breath   . Testicular cancer ([place]) 1994    Past Surgical History:  Procedure Laterality Date  . AORTIC VALVE REPLACEMENT N/A 09/07/2012   Procedure: AORTIC VALVE REPLACEMENT (AVR);  Surgeon: Gaye Pollack, MD;  Location: [place];  Service: Open Heart Surgery;  Laterality: N/A;  . CARDIAC CATHETERIZATION    . CARDIOVERSION N/A 07/13/2012   Procedure: CARDIOVERSION;  Surgeon: [name], MD;  Location: [facility] ENDOSCOPY;  Service: Cardiovascular;  Laterality: N/A;  . CARDIOVERSION N/A  08/08/2012   Procedure: CARDIOVERSION;  Surgeon: [name], MD;  Location: [place];  Service: Cardiovascular;  Laterality: N/A;  . CLIPPING OF ATRIAL APPENDAGE N/A 09/07/2012   Procedure: CLIPPING OF ATRIAL APPENDAGE;  Surgeon: Gaye Pollack, MD;  Location: [place];  Service: Open Heart Surgery;  Laterality: N/A;  . INTRAOPERATIVE TRANSESOPHAGEAL ECHOCARDIOGRAM N/A 09/07/2012   Procedure: INTRAOPERATIVE TRANSESOPHAGEAL ECHOCARDIOGRAM;  Surgeon: Gaye Pollack, MD;  Location: [place] OR;  Service: Open Heart Surgery;  Laterality: N/A;  . KNEE ARTHROSCOPY W/ MENISCAL REPAIR Right 1988  . LEFT AND RIGHT HEART CATHETERIZATION WITH CORONARY ANGIOGRAM N/A 08/15/2012   Procedure: LEFT AND RIGHT HEART CATHETERIZATION WITH CORONARY ANGIOGRAM;  Surgeon: [name], MD;  Location: [facility] CATH LAB;  Service: Cardiovascular;  Laterality: N/A;  . MAZE N/A 09/07/2012   Procedure: MAZE;  Surgeon: Gaye Pollack, MD;  Location: [place];  Service: Open Heart Surgery;  Laterality: N/A;  . MITRAL VALVE REPAIR N/A 09/07/2012   Procedure: MITRAL VALVE REPAIR (MVR);  Surgeon: Gaye Pollack, MD;  Location: [place];  Service: Open Heart Surgery;  Laterality: N/A;  . ORCHIECTOMY Left 1993  . TEE WITHOUT CARDIOVERSION N/A 07/13/2012   Procedure: TRANSESOPHAGEAL ECHOCARDIOGRAM (TEE);  Surgeon: [name], MD;  Location: [facility] ENDOSCOPY;  Service: Cardiovascular;  Laterality: N/A;     Current Outpatient Medications  Medication Sig Dispense Refill  . amLODipine-valsartan (EXFORGE) 5-160 MG tablet Take 1 tablet by mouth daily.    . hydrochlorothiazide (HYDRODIURIL) 12.5 MG tablet Take 1 tablet by mouth daily.  3   No current facility-administered medications for this visit.     Allergies:   Amiodarone    Social History:  The patient  reports that he has never smoked. He has never used smokeless tobacco. He reports that he drinks alcohol. He reports that he does not use drugs.   Family History:  The patient's  family history includes Diabetes in his paternal grandmother; Heart disease in his paternal grandfather; Pulmonary fibrosis in his father.    ROS:  Please see the history of present illness.   Otherwise, review of systems are positive for recent BP issues.   All other systems are reviewed and negative.    PHYSICAL EXAM: VS:  BP (!) 146/84   Pulse 81   Ht 6\' 1"  (1.854 m)   Wt 210 lb 1.9 oz (95.3 kg)   SpO2 94%   BMI 27.72 kg/m  , BMI Body mass index is 27.72 kg/m. GEN: Well nourished, well developed, in no acute distress  HEENT: normal  Neck: no JVD, carotid bruits, or masses Cardiac: RRR; 2 out of 6 systolic murmurs, ; no rubs, or gallops,no edema  Respiratory:  clear to auscultation bilaterally, normal work of breathing GI: soft, nontender, nondistended, + BS MS: no deformity or atrophy  Skin: warm and dry, no rash Neuro:  Strength and sensation are intact Psych: euthymic mood, full affect   EKG:   The ekg ordered today demonstrates NSR, no ST segment changes   Recent Labs: No results found for requested labs within last 8760 hours.   Lipid Panel No results found for: CHOL, TRIG, HDL, CHOLHDL, VLDL, LDLCALC, LDLDIRECT   Other studies Reviewed: Additional studies/ records that were reviewed today with results demonstrating: 2014 echo reviewed..   ASSESSMENT AND PLAN:  1. S/p AVR: Aortic valve disorder led to replacement of the valve.  Continue SBE prophylaxis. 2. S/p MV repair: Plan for echo next year in 2020.  No CHF symptoms. 3. HTN: Stop HCTZ.  Decrease Exforge to half tablet, therefore he will be getting 2.5 mg amlodipine and 80 mg of valsartan.  Blood pressure better controlled today.  He has changed his diet significantly.  He has decreased salt intake.  His symptoms do go along with low blood pressure.  He will check his blood pressure at the drugstore.  Hopefully, blood pressure will remain below 140/90 and he can continue on a lower dosage of medicine.   Ultimately, he would like to try to minimize the medication.  He may be able to switch to a single agent.  He has follow-up with Dr. Inda Merlin in a few weeks.  Blood work to be checked at that time. 4. PAF: No symptoms of atrial fibrillation at this time.    Current medicines are reviewed at length with the patient today.  The patient concerns regarding his medicines were addressed.  The following changes have been made:  As above  Labs/ tests ordered today include:  No orders of the defined types were placed in this encounter.   Recommend 150 minutes/week of aerobic exercise Low fat, low carb, high fiber diet recommended  Disposition:   FU in 1 year   Signed, Larae Grooms, MD  05/23/2017  9:43 AM    [facility] [address], [place], [place]  [address] Phone: [phone]; Fax: [phone]

## 2017-05-23 ENCOUNTER — Encounter: Payer: Self-pay | Admitting: Interventional Cardiology

## 2017-05-23 ENCOUNTER — Ambulatory Visit (INDEPENDENT_AMBULATORY_CARE_PROVIDER_SITE_OTHER): Payer: BLUE CROSS/BLUE SHIELD | Admitting: Interventional Cardiology

## 2017-05-23 VITALS — BP 146/84 | HR 81 | Ht 73.0 in | Wt 210.1 lb

## 2017-05-23 DIAGNOSIS — Z952 Presence of prosthetic heart valve: Secondary | ICD-10-CM

## 2017-05-23 DIAGNOSIS — I480 Paroxysmal atrial fibrillation: Secondary | ICD-10-CM | POA: Diagnosis not present

## 2017-05-23 DIAGNOSIS — Z9889 Other specified postprocedural states: Secondary | ICD-10-CM

## 2017-05-23 DIAGNOSIS — Z8679 Personal history of other diseases of the circulatory system: Secondary | ICD-10-CM

## 2017-05-23 DIAGNOSIS — I359 Nonrheumatic aortic valve disorder, unspecified: Secondary | ICD-10-CM

## 2017-05-23 MED ORDER — AMLODIPINE BESYLATE-VALSARTAN 5-160 MG PO TABS: 0.5000 | ORAL_TABLET | Freq: Every day | ORAL | 3 refills | Status: DC

## 2017-05-23 NOTE — Patient Instructions (Signed)
Medication Instructions:  Your physician has recommended you make the following change in your medication:   1. CHANGE: amlodipine-valsartan 5-160 mg: Take 1/2 tablet once daily  2. STOP: Hydrochlorothiazide  Labwork: None ordered  Testing/Procedures: None ordered  Follow-Up: Your physician wants you to follow-up in: 1 year with Dr. [name]. You will receive a reminder letter in the mail two months in advance. If you don't receive a letter, please call our office to schedule the follow-up appointment.   Keep follow-up with your Primary Care Doctor  Any Other Special Instructions Will Be Listed Below (If Applicable).  Continue to monitor your Blood Pressure with the medication change   If you need a refill on your cardiac medications before your next appointment, please call your pharmacy.

## 2017-06-14 DIAGNOSIS — R030 Elevated blood-pressure reading, without diagnosis of hypertension: Secondary | ICD-10-CM | POA: Diagnosis not present

## 2017-09-08 ENCOUNTER — Ambulatory Visit (INDEPENDENT_AMBULATORY_CARE_PROVIDER_SITE_OTHER): Payer: BLUE CROSS/BLUE SHIELD | Admitting: Family Medicine

## 2017-09-08 VITALS — BP 128/88 | Ht 73.0 in | Wt 208.0 lb

## 2017-09-08 DIAGNOSIS — M25561 Pain in right knee: Secondary | ICD-10-CM

## 2017-09-08 NOTE — Patient Instructions (Signed)
I have given you some temporary orthotics.  If these work, you can stick with them or you can decide to go for the more expensive but longer-lasting custom molded orthotics.  If these work well and last about a year then there is no need to go for the higher dollar product.  Regarding exercises, I would do:  lateral step ups for proprioception and pelvic strengthening.   he leg extension with a weight machine can be done with low to medium weights as long as you do not fully straighten the knee out.   Similarly, you can do squats with a machine  Or a safety rack as long as you do not go to the extreme of flexion or extension of the knee.  A great exercise for the knee is the single arc quadricep exercise.  We have given you a handout and explanation on that.    It was great to meet you!

## 2017-09-08 NOTE — Progress Notes (Signed)
  Reginald Myers - 62 y.o. male MRN [id number]  Date of birth: 07/20/1955    SUBJECTIVE:      Chief Complaint:/ HPI:  Right knee pain.  Some questions about exercise and injury prevention.  History of right knee arthroscopy.  In the last 2 weeks his right knee is just felt a little bit weaker than that on the left.  He feels like he is a little unsteady walking is occasionally having left hip pain when he is done a lot of walking.  Very active.  Knee pain is only with activity, sharp, anterior, nonradiating.  Better with rest.   ROS:     No unusual weight change, no fever, no skin rash  PERTINENT  PMH / PSH FH / / SH:  Past Medical, Surgical, Social, and Family History Reviewed & Updated in the EMR.  Pertinent findings include:  No personal history diabetes mellitus Non-smoker History of right knee arthroscopy for meniscal tear  OBJECTIVE: BP 128/88   Ht 6\' 1"  (1.854 m)   Wt 208 lb (94.3 kg)   BMI 27.44 kg/m   Physical Exam:  Vital signs are reviewed. GENERAL: Well-developed male no acute distress Gait: Normal stride length.  Overpronation bilaterally with some genu valgus. HIPS: Internal and external rotation is full and painless KNEE: Symmetrical.  Right knee full range of motion flexion extension.  Ligamentously intact to varus and valgus stress.  No joint line tenderness.  No effusion.  Popliteal space is benign.  Calf is benign. Skin: Skin around the area of the knee is without any sign of lesion or rash  ASSESSMENT & PLAN:  #1.  Mild right knee pain.  History of arthroscopy.  Gave him home exercise program for knee strengthening with specific emphasis on short arc quadriceps and wall slides.  Also gave him some hip exercises to do.  Reassurance.  Follow-up PRN

## 2017-12-10 DIAGNOSIS — Z23 Encounter for immunization: Secondary | ICD-10-CM | POA: Diagnosis not present

## 2018-01-03 DIAGNOSIS — E782 Mixed hyperlipidemia: Secondary | ICD-10-CM | POA: Diagnosis not present

## 2018-01-03 DIAGNOSIS — R030 Elevated blood-pressure reading, without diagnosis of hypertension: Secondary | ICD-10-CM | POA: Diagnosis not present

## 2018-12-01 DIAGNOSIS — Z23 Encounter for immunization: Secondary | ICD-10-CM | POA: Diagnosis not present

## 2018-12-19 DIAGNOSIS — R030 Elevated blood-pressure reading, without diagnosis of hypertension: Secondary | ICD-10-CM | POA: Diagnosis not present

## 2018-12-19 DIAGNOSIS — Z20828 Contact with and (suspected) exposure to other viral communicable diseases: Secondary | ICD-10-CM | POA: Diagnosis not present

## 2019-01-14 DIAGNOSIS — Z20828 Contact with and (suspected) exposure to other viral communicable diseases: Secondary | ICD-10-CM | POA: Diagnosis not present

## 2019-03-12 DIAGNOSIS — D1801 Hemangioma of skin and subcutaneous tissue: Secondary | ICD-10-CM | POA: Diagnosis not present

## 2019-03-12 DIAGNOSIS — L821 Other seborrheic keratosis: Secondary | ICD-10-CM | POA: Diagnosis not present

## 2019-03-12 DIAGNOSIS — C4441 Basal cell carcinoma of skin of scalp and neck: Secondary | ICD-10-CM | POA: Diagnosis not present

## 2019-03-12 DIAGNOSIS — L570 Actinic keratosis: Secondary | ICD-10-CM | POA: Diagnosis not present

## 2019-03-12 DIAGNOSIS — D225 Melanocytic nevi of trunk: Secondary | ICD-10-CM | POA: Diagnosis not present

## 2019-04-09 DIAGNOSIS — C4441 Basal cell carcinoma of skin of scalp and neck: Secondary | ICD-10-CM | POA: Diagnosis not present

## 2019-07-11 DIAGNOSIS — Z85828 Personal history of other malignant neoplasm of skin: Secondary | ICD-10-CM | POA: Diagnosis not present

## 2019-07-11 DIAGNOSIS — L570 Actinic keratosis: Secondary | ICD-10-CM | POA: Diagnosis not present

## 2019-07-11 DIAGNOSIS — C44319 Basal cell carcinoma of skin of other parts of face: Secondary | ICD-10-CM | POA: Diagnosis not present

## 2019-07-31 DIAGNOSIS — C44319 Basal cell carcinoma of skin of other parts of face: Secondary | ICD-10-CM | POA: Diagnosis not present

## 2019-11-24 DIAGNOSIS — Z23 Encounter for immunization: Secondary | ICD-10-CM | POA: Diagnosis not present

## 2020-01-10 DIAGNOSIS — L821 Other seborrheic keratosis: Secondary | ICD-10-CM | POA: Diagnosis not present

## 2020-01-10 DIAGNOSIS — L570 Actinic keratosis: Secondary | ICD-10-CM | POA: Diagnosis not present

## 2020-01-10 DIAGNOSIS — D485 Neoplasm of uncertain behavior of skin: Secondary | ICD-10-CM | POA: Diagnosis not present

## 2020-01-10 DIAGNOSIS — C4441 Basal cell carcinoma of skin of scalp and neck: Secondary | ICD-10-CM | POA: Diagnosis not present

## 2020-01-10 DIAGNOSIS — Z85828 Personal history of other malignant neoplasm of skin: Secondary | ICD-10-CM | POA: Diagnosis not present

## 2020-01-10 DIAGNOSIS — L739 Follicular disorder, unspecified: Secondary | ICD-10-CM | POA: Diagnosis not present

## 2020-01-10 DIAGNOSIS — D1801 Hemangioma of skin and subcutaneous tissue: Secondary | ICD-10-CM | POA: Diagnosis not present

## 2020-03-24 ENCOUNTER — Telehealth: Payer: Self-pay | Admitting: Interventional Cardiology

## 2020-03-24 DIAGNOSIS — I10 Essential (primary) hypertension: Secondary | ICD-10-CM | POA: Diagnosis not present

## 2020-03-24 NOTE — Telephone Encounter (Signed)
Pt c/o BP issue: STAT if pt c/o blurred vision, one-sided weakness or slurred speech  1. What are your last 5 BP readings? 200/83- pt wants to be seen today  2. Are you having any other symptoms (ex. Dizziness, headache, blurred vision, passed out)? no  3. What is your BP issue? *blood pressure is high- pt wants to be seen today

## 2020-03-24 NOTE — Telephone Encounter (Signed)
Attempted phone call to pt and left voicemail message to contact any triage nurse at [phone].

## 2020-03-24 NOTE — Telephone Encounter (Signed)
Spoke with pt and made an appt today with PCP and was started on 2 meds Amlodipine 5 mg and Losartan 50 mg 1 tab daily Pt will monitor B/P and if meds need to be adjusted will follow up with PCP Pt to see Dr [name] on 04/27/20 at 8:00 am./

## 2020-03-26 DIAGNOSIS — E7800 Pure hypercholesterolemia, unspecified: Secondary | ICD-10-CM | POA: Diagnosis not present

## 2020-03-26 DIAGNOSIS — I10 Essential (primary) hypertension: Secondary | ICD-10-CM | POA: Diagnosis not present

## 2020-04-06 DIAGNOSIS — C4441 Basal cell carcinoma of skin of scalp and neck: Secondary | ICD-10-CM | POA: Diagnosis not present

## 2020-04-16 DIAGNOSIS — L570 Actinic keratosis: Secondary | ICD-10-CM | POA: Diagnosis not present

## 2020-04-22 DIAGNOSIS — R0989 Other specified symptoms and signs involving the circulatory and respiratory systems: Secondary | ICD-10-CM | POA: Diagnosis not present

## 2020-04-22 DIAGNOSIS — I10 Essential (primary) hypertension: Secondary | ICD-10-CM | POA: Diagnosis not present

## 2020-04-22 DIAGNOSIS — E7800 Pure hypercholesterolemia, unspecified: Secondary | ICD-10-CM | POA: Diagnosis not present

## 2020-04-25 NOTE — Progress Notes (Signed)
Cardiology Office Note   Date:  04/27/2020   ID:  Reginald, Myers [birth year], MRN [id number]  PCP:  Josetta Huddle, MD    No chief complaint on file.  S/p AVR  Wt Readings from Last 3 Encounters:  04/27/20 208 lb 12.8 oz (94.7 kg)  09/08/17 208 lb (94.3 kg)  05/23/17 210 lb 1.9 oz (95.3 kg)       History of Present Illness: Reginald Myers is a 65 y.o. male    who had an AVR , mitral valve ring placed and Maze procedurein 2014.  In 2019, he was started on some HTN meds.  BP was 244 systolic at Dr. Inda Merlin office. He had not been eating well since 11/18 and he thinks the BP is the result.  He was started on Exforge 5/160 mg and felt somewhat lightheaded.  Ultimately, went off of these meds, but had high BP at a MOHS procedure in 2022.  Amlodipine and losartan were restarted.   BPs at home after rest is in the 010-272 range systolic.  BP is high at the MD's office.   Walks 30-35 miles a week.  Feels well.    Denies : Chest pain. Dizziness. Leg edema. Nitroglycerin use. Orthopnea. Palpitations. Paroxysmal nocturnal dyspnea. Shortness of breath. Syncope.     Past Medical History:  Diagnosis Date  . Acute systolic heart failure ([place])   . Aortic valve disorders   . Atrial fibrillation ([place])   . Bell's palsy 09/2009   right-residual weakness with fatique   . Dysrhythmia   . Heart murmur   . Hemorrhoids   . Hyperlipidemia   . Hypertension   . Orthopnea   . Right knee DJD    prior meniscal surgery  . Shortness of breath   . Testicular cancer ([place]) 1994    Past Surgical History:  Procedure Laterality Date  . AORTIC VALVE REPLACEMENT N/A 09/07/2012   Procedure: AORTIC VALVE REPLACEMENT (AVR);  Surgeon: Gaye Pollack, MD;  Location: [place];  Service: Open Heart Surgery;  Laterality: N/A;  . CARDIAC CATHETERIZATION    . CARDIOVERSION N/A 07/13/2012   Procedure: CARDIOVERSION;  Surgeon: [name], MD;  Location[facility] ENDOSCOPY;  Service: Cardiovascular;   Laterality: N/A;  . CARDIOVERSION N/A 08/08/2012   Procedure: CARDIOVERSION;  Surgeon: [name], MD;  Location: [place];  Service: Cardiovascular;  Laterality: N/A;  . CLIPPING OF ATRIAL APPENDAGE N/A 09/07/2012   Procedure: CLIPPING OF ATRIAL APPENDAGE;  Surgeon: Gaye Pollack, MD;  Location: [place];  Service: Open Heart Surgery;  Laterality: N/A;  . INTRAOPERATIVE TRANSESOPHAGEAL ECHOCARDIOGRAM N/A 09/07/2012   Procedure: INTRAOPERATIVE TRANSESOPHAGEAL ECHOCARDIOGRAM;  Surgeon: Gaye Pollack, MD;  Location: [place] OR;  Service: Open Heart Surgery;  Laterality: N/A;  . KNEE ARTHROSCOPY W/ MENISCAL REPAIR Right 1988  . LEFT AND RIGHT HEART CATHETERIZATION WITH CORONARY ANGIOGRAM N/A 08/15/2012   Procedure: LEFT AND RIGHT HEART CATHETERIZATION WITH CORONARY ANGIOGRAM;  Surgeon: [name], MD;  Location: [facility] CATH LAB;  Service: Cardiovascular;  Laterality: N/A;  . MAZE N/A 09/07/2012   Procedure: MAZE;  Surgeon: Gaye Pollack, MD;  Location: [place];  Service: Open Heart Surgery;  Laterality: N/A;  . MITRAL VALVE REPAIR N/A 09/07/2012   Procedure: MITRAL VALVE REPAIR (MVR);  Surgeon: Gaye Pollack, MD;  Location: [place];  Service: Open Heart Surgery;  Laterality: N/A;  . ORCHIECTOMY Left 1993  . TEE WITHOUT CARDIOVERSION N/A 07/13/2012  Procedure: TRANSESOPHAGEAL ECHOCARDIOGRAM (TEE);  Surgeon: [name], MD;  Location: [facility] ENDOSCOPY;  Service: Cardiovascular;  Laterality: N/A;     Current Outpatient Medications  Medication Sig Dispense Refill  . amLODipine (NORVASC) 5 MG tablet which pharmacy is    . atorvastatin (LIPITOR) 10 MG tablet 1 tablet    . losartan (COZAAR) 50 MG tablet 1 tablet     No current facility-administered medications for this visit.    Allergies:   Amiodarone and Statins    Social History:  The patient  reports that he has never smoked. He has never used smokeless tobacco. He reports current alcohol use. He reports that he does not use drugs.    Family History:  The patient's family history includes Diabetes in his paternal grandmother; Heart disease in his paternal grandfather; Pulmonary fibrosis in his father.    ROS:  Please see the history of present illness.   Otherwise, review of systems are positive for .   All other systems are reviewed and negative.    PHYSICAL EXAM: VS:  BP (!) 152/72   Pulse 62   Ht 6\' 1"  (1.854 m)   Wt 208 lb 12.8 oz (94.7 kg)   SpO2 99%   BMI 27.55 kg/m  , BMI Body mass index is 27.55 kg/m. GEN: Well nourished, well developed, in no acute distress  HEENT: normal  Neck: no JVD, carotid bruits, or masses Cardiac: RRR; 2/6 systolic murmurs, rubs, or gallops,no edema  Respiratory:  clear to auscultation bilaterally, normal work of breathing GI: soft, nontender, nondistended, + BS MS: no deformity or atrophy  Skin: warm and dry, no rash Neuro:  Strength and sensation are intact Psych: euthymic mood, full affect   EKG:   The ekg ordered today demonstrates NSR, no ST segment   Recent Labs: No results found for requested labs within last 8760 hours.   Lipid Panel No results found for: CHOL, TRIG, HDL, CHOLHDL, VLDL, LDLCALC, LDLDIRECT   Other studies Reviewed: Additional studies/ records that were reviewed today with results demonstrating: labs from PMD reviewed.   ASSESSMENT AND PLAN:  1. S/p AVR, s/p MV repair: Needs SBE prophylaxis.  Check echo given prior valve surgery.  Start aspirin 81 mg daily.   2. HTN: Low salt diet.   Continue exercise.  3. PAF: No palpitations.  4. Hyperlipidemia: Atorvastatin 10 mg given LDL 180.  Recheck in June with PMD.  WOuld like to see LDL < 100. Whole food, plant based diet. Increase atorvastatin to 20 mg since 10 mg is unlikely to get him to target.   Current medicines are reviewed at length with the patient today.  The patient concerns regarding his medicines were addressed.  The following changes have been made:  No change  Labs/ tests  ordered today include:  No orders of the defined types were placed in this encounter.   Recommend 150 minutes/week of aerobic exercise Low fat, low carb, high fiber diet recommended  Disposition:   FU in 1 year   Signed, Larae Grooms, MD  04/27/2020 8:32 AM    [facility] [address], [place], [place]  [address] Phone: [phone]; Fax: [phone]

## 2020-04-27 ENCOUNTER — Other Ambulatory Visit: Payer: Self-pay

## 2020-04-27 ENCOUNTER — Ambulatory Visit (INDEPENDENT_AMBULATORY_CARE_PROVIDER_SITE_OTHER): Payer: BC Managed Care – PPO | Admitting: Interventional Cardiology

## 2020-04-27 ENCOUNTER — Encounter: Payer: Self-pay | Admitting: Interventional Cardiology

## 2020-04-27 VITALS — BP 152/72 | HR 62 | Ht 73.0 in | Wt 208.8 lb

## 2020-04-27 DIAGNOSIS — Z9889 Other specified postprocedural states: Secondary | ICD-10-CM | POA: Diagnosis not present

## 2020-04-27 DIAGNOSIS — Z952 Presence of prosthetic heart valve: Secondary | ICD-10-CM

## 2020-04-27 DIAGNOSIS — I359 Nonrheumatic aortic valve disorder, unspecified: Secondary | ICD-10-CM

## 2020-04-27 MED ORDER — ATORVASTATIN CALCIUM 20 MG PO TABS: 20.0000 mg | ORAL_TABLET | Freq: Every day | ORAL | 3 refills | Status: DC

## 2020-04-27 MED ORDER — ASPIRIN EC 81 MG PO TBEC: 81.0000 mg | DELAYED_RELEASE_TABLET | Freq: Every day | ORAL | 3 refills | Status: DC

## 2020-04-27 NOTE — Patient Instructions (Signed)
Medication Instructions:  Your physician has recommended you make the following change in your medication: Start enteric coated aspirin 81 mg by mouth daily. Increase atorvastatin to 20 mg by mouth daily  *If you need a refill on your cardiac medications before your next appointment, please call your pharmacy*   Lab Work: None--to be done at [facility] in June If you have labs (blood work) drawn today and your tests are completely normal, you will receive your results only by: Marland Kitchen MyChart Message (if you have MyChart) OR . A paper copy in the mail If you have any lab test that is abnormal or we need to change your treatment, we will call you to review the results.   Testing/Procedures: Your physician has requested that you have an echocardiogram. Echocardiography is a painless test that uses sound waves to create images of your heart. It provides your doctor with information about the size and shape of your heart and how well your heart's chambers and valves are working. This procedure takes approximately one hour. There are no restrictions for this procedure.     Follow-Up: At [facility], you and your health needs are our priority.  As part of our continuing mission to provide you with exceptional heart care, we have created designated Provider Care Teams.  These Care Teams include your primary Cardiologist (physician) and Advanced Practice Providers (APPs -  Physician Assistants and Nurse Practitioners) who all work together to provide you with the care you need, when you need it.  We recommend signing up for the patient portal called "MyChart".  Sign up information is provided on this After Visit Summary.  MyChart is used to connect with patients for Virtual Visits (Telemedicine).  Patients are able to view lab/test results, encounter notes, upcoming appointments, etc.  Non-urgent messages can be sent to your provider as well.   To learn more about what you can do with MyChart, go to  [email or web address].    Your next appointment:   12 month(s)  The format for your next appointment:   In Person  Provider:   You may see Dr [name] or one of the following Advanced Practice Providers on your designated Care Team:    Melina Copa, PA-C  Ermalinda Barrios, PA-C    Other Instructions

## 2020-05-20 ENCOUNTER — Other Ambulatory Visit: Payer: Self-pay

## 2020-05-20 ENCOUNTER — Ambulatory Visit (HOSPITAL_COMMUNITY): Payer: BC Managed Care – PPO | Attending: Cardiology

## 2020-05-20 DIAGNOSIS — I359 Nonrheumatic aortic valve disorder, unspecified: Secondary | ICD-10-CM

## 2020-05-20 DIAGNOSIS — Z9889 Other specified postprocedural states: Secondary | ICD-10-CM | POA: Diagnosis not present

## 2020-05-20 DIAGNOSIS — Z952 Presence of prosthetic heart valve: Secondary | ICD-10-CM | POA: Diagnosis not present

## 2020-05-20 LAB — ECHOCARDIOGRAM COMPLETE
AR max vel: 0.58 cm2
AV Area VTI: 0.6 cm2
AV Area mean vel: 0.6 cm2
AV Mean grad: 33 mmHg
AV Peak grad: 68.6 mmHg
Ao pk vel: 4.14 m/s
Area-P 1/2: 2.08 cm2
MV VTI: 1.06 cm2
P 1/2 time: 475 msec
S' Lateral: 3.3 cm

## 2020-05-25 DIAGNOSIS — H5213 Myopia, bilateral: Secondary | ICD-10-CM | POA: Diagnosis not present

## 2020-05-25 DIAGNOSIS — Z135 Encounter for screening for eye and ear disorders: Secondary | ICD-10-CM | POA: Diagnosis not present

## 2020-05-29 ENCOUNTER — Telehealth: Payer: Self-pay | Admitting: Interventional Cardiology

## 2020-05-29 ENCOUNTER — Encounter: Payer: Self-pay | Admitting: *Deleted

## 2020-05-29 DIAGNOSIS — I359 Nonrheumatic aortic valve disorder, unspecified: Secondary | ICD-10-CM

## 2020-05-29 DIAGNOSIS — Z8679 Personal history of other diseases of the circulatory system: Secondary | ICD-10-CM

## 2020-05-29 DIAGNOSIS — Z952 Presence of prosthetic heart valve: Secondary | ICD-10-CM

## 2020-05-29 DIAGNOSIS — Z9889 Other specified postprocedural states: Secondary | ICD-10-CM

## 2020-05-29 DIAGNOSIS — I5021 Acute systolic (congestive) heart failure: Secondary | ICD-10-CM

## 2020-05-29 MED ORDER — METOPROLOL TARTRATE 100 MG PO TABS: ORAL_TABLET | ORAL | 0 refills | Status: DC

## 2020-05-29 NOTE — Telephone Encounter (Signed)
    Pt is returning call for his echo result  

## 2020-05-29 NOTE — Telephone Encounter (Signed)
Spoke with pt and reviewed echo results and recommendation for Coronary CT.  Pt agreeable.  Reviewed instructions.  Advised I will place these in Mychart and to call if any questions.  Pt verbalized understanding and was in agreement with plan.          Lorenza Evangelist, RN  Thompson Grayer, RN; [name], MD Fraser Din / Elza Rafter / Caren Hazy ,   Please let me know if you need help placing the order. (should just be a coronary CTA/FFR order set) if you'd like, he also needs 100mg  metoprolol tartrate for HR control.   Thank you,  Marchia Bond        Previous Messages   ----- Message -----  From: Geralynn Rile, MD  Sent: 05/29/2020  8:29 AM EDT  To: [name], MD, Lorenza Evangelist, RN, *   Sounds good. We will get the patient scheduled.   -Wes  ----- Message -----  From: [name], MD  Sent: 05/28/2020 10:15 PM EDT  To: Geralynn Rile, MD, Lorenza Evangelist, RN, *   Thanks Wes for your thoughtful reply.   I would like to get a cardiac CT to better understand the pathology. Will definitely have it sent to you for reading. Thanks.   JV    ----- Message -----  From: Geralynn Rile, MD  Sent: 05/23/2020  9:35 AM EDT  To: [name], MD, *   Jay:   I took a look the echo. The valve was implanted in 2014 so there is a low likelihood this is perivalvular leak. In SAVR, we see those pretty quick after surgery. 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 The AI does not explain the gradient. The leaflets look thickened and I suspect the leaflet of the [place] is immobile based on A3C. 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   For prosthetic valve regurgitation, I usually get a TEE and CT. TEE really gives the functional data, and CT will compliment this to really get a good look at leaflets that may be missed by shadowing. For prosthetic valve stenosis, I usually stick with CT as this gets a better look at  leaflets and pathology. Gradients from stenosis are often best seen on TTE just like with native AS.   For this case, I see we are getting V max ~4 m/s. I do wonder if we are missing the highest gradient by not being parallel to the jet. TEE may help prove there is severe AS, depending on the operator. A CT would clearly show [place] why the gradient is high (suspect calcified immobile leaflet). To me, you have enough for re-do surgery and a CT would help confirm the pathology. The timing of surgery would be up to you and the patient as it is only a matter of time.   If you do order a CT, please send a message to Marchia Bond and myself to make sure I am the reader. I am collecting cases at [facility] of our experience with CT for prosthetic valve dysfunction.   -Wes   ----- Message -----  From: [name], MD  Sent: 05/21/2020 12:35 PM EDT  To: Geralynn Rile, MD, *   Wes,   Can you take a look at this echo. Dalton read it. Wondering if you would recommend CTA like you did on one of my patients a few weeks ago. THanks.   JV

## 2020-06-10 ENCOUNTER — Telehealth: Payer: Self-pay | Admitting: *Deleted

## 2020-06-10 MED ORDER — METOPROLOL TARTRATE 100 MG PO TABS: ORAL_TABLET | ORAL | 0 refills | Status: DC

## 2020-06-10 NOTE — Telephone Encounter (Signed)
Patient called the office and stated that he would like for medication for CT to be sent to [facility] in [place], [place]. He stated  "Procedure has been scheduled for Tuesday, 06/16/20."

## 2020-06-10 NOTE — Telephone Encounter (Signed)
Spoke with the patient who states that he never went and picked up his Lopressor so the pharmacy put it back on the shelf and he needs it called back in. I have resent in prescription for Lopressor 100 mg for patient to take prior to CT scan.

## 2020-06-10 NOTE — Addendum Note (Signed)
Addended by: Antonieta Iba on: 06/10/2020 02:59 PM   Modules accepted: Orders

## 2020-06-11 ENCOUNTER — Other Ambulatory Visit: Payer: Self-pay

## 2020-06-11 ENCOUNTER — Other Ambulatory Visit: Payer: BC Managed Care – PPO

## 2020-06-11 DIAGNOSIS — I5021 Acute systolic (congestive) heart failure: Secondary | ICD-10-CM

## 2020-06-11 DIAGNOSIS — Z9889 Other specified postprocedural states: Secondary | ICD-10-CM | POA: Diagnosis not present

## 2020-06-11 DIAGNOSIS — Z952 Presence of prosthetic heart valve: Secondary | ICD-10-CM

## 2020-06-11 DIAGNOSIS — Z8679 Personal history of other diseases of the circulatory system: Secondary | ICD-10-CM

## 2020-06-11 DIAGNOSIS — I359 Nonrheumatic aortic valve disorder, unspecified: Secondary | ICD-10-CM

## 2020-06-11 LAB — BASIC METABOLIC PANEL
BUN/Creatinine Ratio: 16 (ref 10–24)
BUN: 19 mg/dL (ref 8–27)
CO2: 23 mmol/L (ref 20–29)
Calcium: 9.7 mg/dL (ref 8.6–10.2)
Chloride: 102 mmol/L (ref 96–106)
Creatinine, Ser: 1.22 mg/dL (ref 0.76–1.27)
Glucose: 88 mg/dL (ref 65–99)
Potassium: 4.7 mmol/L (ref 3.5–5.2)
Sodium: 139 mmol/L (ref 134–144)
eGFR: 67 mL/min/{1.73_m2} (ref >59)

## 2020-06-16 ENCOUNTER — Ambulatory Visit (HOSPITAL_COMMUNITY): Payer: BC Managed Care – PPO

## 2020-07-08 ENCOUNTER — Telehealth (HOSPITAL_COMMUNITY): Payer: Self-pay | Admitting: *Deleted

## 2020-07-08 ENCOUNTER — Other Ambulatory Visit (HOSPITAL_COMMUNITY): Payer: Self-pay | Admitting: Emergency Medicine

## 2020-07-08 DIAGNOSIS — Z952 Presence of prosthetic heart valve: Secondary | ICD-10-CM

## 2020-07-08 NOTE — Telephone Encounter (Signed)
Reaching out to patient to offer assistance regarding upcoming cardiac imaging study; pt verbalizes understanding of appt date/time, parking situation and where to check in, pre-test NPO status and medications ordered, and verified current allergies; name and call back number provided for further questions should they arise  Gordy Clement RN Navigator Cardiac [place] [facility] [phone] office [phone] cell  Pt to take 100mg  metoprolol tartrate two hours prior to scan.

## 2020-07-09 ENCOUNTER — Other Ambulatory Visit: Payer: Self-pay

## 2020-07-09 ENCOUNTER — Ambulatory Visit (HOSPITAL_COMMUNITY)
Admission: RE | Admit: 2020-07-09 | Discharge: 2020-07-09 | Disposition: A | Discharge status: Home / Self Care | Payer: BC Managed Care – PPO | Source: Ambulatory Visit | Attending: Interventional Cardiology | Admitting: Interventional Cardiology

## 2020-07-09 DIAGNOSIS — Z9889 Other specified postprocedural states: Secondary | ICD-10-CM | POA: Diagnosis not present

## 2020-07-09 DIAGNOSIS — Z952 Presence of prosthetic heart valve: Secondary | ICD-10-CM | POA: Insufficient documentation

## 2020-07-09 DIAGNOSIS — I5021 Acute systolic (congestive) heart failure: Secondary | ICD-10-CM | POA: Diagnosis not present

## 2020-07-09 DIAGNOSIS — I359 Nonrheumatic aortic valve disorder, unspecified: Secondary | ICD-10-CM | POA: Insufficient documentation

## 2020-07-09 MED ORDER — NITROGLYCERIN 0.4 MG SL SUBL: 0.8000 mg | SUBLINGUAL_TABLET | Freq: Once | SUBLINGUAL | Status: AC

## 2020-07-09 MED ORDER — NITROGLYCERIN 0.4 MG SL SUBL
SUBLINGUAL_TABLET | SUBLINGUAL | Status: AC
  Administered 2020-07-09: 0.8 mg via SUBLINGUAL
  Filled 2020-07-09: qty 2

## 2020-07-09 MED ORDER — IOHEXOL 350 MG/ML SOLN
100.0000 mL | Freq: Once | INTRAVENOUS | Status: AC
  Administered 2020-07-09: 95 mL via INTRAVENOUS

## 2020-07-13 DIAGNOSIS — Z85828 Personal history of other malignant neoplasm of skin: Secondary | ICD-10-CM | POA: Diagnosis not present

## 2020-07-13 DIAGNOSIS — L814 Other melanin hyperpigmentation: Secondary | ICD-10-CM | POA: Diagnosis not present

## 2020-07-13 DIAGNOSIS — L570 Actinic keratosis: Secondary | ICD-10-CM | POA: Diagnosis not present

## 2020-07-13 DIAGNOSIS — L821 Other seborrheic keratosis: Secondary | ICD-10-CM | POA: Diagnosis not present

## 2020-07-13 DIAGNOSIS — D1801 Hemangioma of skin and subcutaneous tissue: Secondary | ICD-10-CM | POA: Diagnosis not present

## 2020-07-17 ENCOUNTER — Telehealth: Payer: Self-pay | Admitting: *Deleted

## 2020-07-17 NOTE — Telephone Encounter (Signed)
[name], MD  Thompson Grayer, RN Can we set up a TEE for one of these days with Dr. Audie Box.         Previous Messages    ----- Message -----  From: Geralynn Rile, MD  Sent: 07/16/2020   7:44 AM EDT  To: [name], MD   Sure. I have Nov 3, Dec 5, Dec 27, Dec 28.   -Wes  ----- Message -----  From: [name], MD  Sent: 07/15/2020   3:45 PM EDT  To: Geralynn Rile, MD, Gaye Pollack, MD   THanks Gwynneth Macleod.   I spoke to the patient and he would like to do the TEE toward the end of the year if possible, maybe November or December.  Do you know your TEE days then?  He is feeling fine.  He walks 35-40 miles a week.   JV

## 2020-07-20 DIAGNOSIS — E559 Vitamin D deficiency, unspecified: Secondary | ICD-10-CM | POA: Diagnosis not present

## 2020-07-20 DIAGNOSIS — Z952 Presence of prosthetic heart valve: Secondary | ICD-10-CM | POA: Diagnosis not present

## 2020-07-20 DIAGNOSIS — Z0000 Encounter for general adult medical examination without abnormal findings: Secondary | ICD-10-CM | POA: Diagnosis not present

## 2020-07-20 DIAGNOSIS — E7800 Pure hypercholesterolemia, unspecified: Secondary | ICD-10-CM | POA: Diagnosis not present

## 2020-07-20 DIAGNOSIS — R0989 Other specified symptoms and signs involving the circulatory and respiratory systems: Secondary | ICD-10-CM | POA: Diagnosis not present

## 2020-07-20 DIAGNOSIS — I10 Essential (primary) hypertension: Secondary | ICD-10-CM | POA: Diagnosis not present

## 2020-07-20 DIAGNOSIS — Z125 Encounter for screening for malignant neoplasm of prostate: Secondary | ICD-10-CM | POA: Diagnosis not present

## 2020-07-30 NOTE — Telephone Encounter (Signed)
I spoke with patient and gave him available dates.  He will call back and let us know what day he would like to have TEE done

## 2020-08-10 NOTE — Addendum Note (Signed)
Encounter addended by: Annie Paras on: 08/10/2020 7:02 AM  Actions taken: Letter saved

## 2020-11-16 NOTE — Telephone Encounter (Signed)
Patient was returning a call for [facility]

## 2020-11-16 NOTE — Telephone Encounter (Signed)
Left message to call office

## 2020-12-22 NOTE — Telephone Encounter (Signed)
I spoke with patient.  He would like to do TEE on 12/5.  He cannot do procedure on 12/27 or 12/28. I checked with scheduling and there are no openings for TEE on 12/5.  Other options for Dr Audie Box are January 11,13,16. I placed call to patient and left message to call office.

## 2020-12-23 NOTE — Telephone Encounter (Signed)
Patient returned call

## 2020-12-23 NOTE — Telephone Encounter (Signed)
Left a message for the pt to call back.  

## 2020-12-29 NOTE — Telephone Encounter (Signed)
I spoke with patient. He would like to schedule TEE for January 11,2023.  Will ask triage to call central scheduling tomorrow to schedule.

## 2020-12-30 NOTE — Telephone Encounter (Signed)
Patient has been scheduled for TEE on January 11th, 2023 with Dr. Audie Box at 10:30am (case [id number]).  Called patient and left him a message with the date and time. Advised him that Fraser Din would be in contact next week to go over his instructions. Advised patient to call back if he had any questions prior to then.

## 2021-01-08 ENCOUNTER — Encounter: Payer: Self-pay | Admitting: *Deleted

## 2021-01-08 NOTE — Telephone Encounter (Signed)
I spoke with patient and reviewed TEE instructions with him.  Will send instruction letter to him through my chart

## 2021-01-13 DIAGNOSIS — D225 Melanocytic nevi of trunk: Secondary | ICD-10-CM | POA: Diagnosis not present

## 2021-01-13 DIAGNOSIS — D2272 Melanocytic nevi of left lower limb, including hip: Secondary | ICD-10-CM | POA: Diagnosis not present

## 2021-01-13 DIAGNOSIS — Z85828 Personal history of other malignant neoplasm of skin: Secondary | ICD-10-CM | POA: Diagnosis not present

## 2021-01-13 DIAGNOSIS — D2271 Melanocytic nevi of right lower limb, including hip: Secondary | ICD-10-CM | POA: Diagnosis not present

## 2021-01-13 DIAGNOSIS — L570 Actinic keratosis: Secondary | ICD-10-CM | POA: Diagnosis not present

## 2021-01-14 ENCOUNTER — Telehealth: Payer: Self-pay | Admitting: Interventional Cardiology

## 2021-01-14 NOTE — Telephone Encounter (Signed)
Patient notified.  Appointment made for him to see Dr [name] on 01/22/21 at 8:40 prior to TEE

## 2021-01-14 NOTE — Telephone Encounter (Signed)
New Message:    Patient wants to know if he can get his TEE rescheduled before the end of this year please?

## 2021-01-14 NOTE — Telephone Encounter (Signed)
I spoke with patient.  He is available for TEE on 12/27 or 12/28.  TEE rescheduled for 12/28 at 10:30

## 2021-01-21 ENCOUNTER — Encounter (HOSPITAL_COMMUNITY): Payer: Self-pay | Admitting: Cardiovascular Disease

## 2021-01-21 NOTE — Progress Notes (Signed)
Attempted to obtain medical history via telephone, unable to reach at this time. I left a voicemail to return pre surgical testing department's phone call.  

## 2021-01-22 ENCOUNTER — Encounter: Payer: Self-pay | Admitting: Interventional Cardiology

## 2021-01-22 ENCOUNTER — Ambulatory Visit (INDEPENDENT_AMBULATORY_CARE_PROVIDER_SITE_OTHER): Payer: BC Managed Care – PPO | Admitting: Interventional Cardiology

## 2021-01-22 ENCOUNTER — Other Ambulatory Visit: Payer: Self-pay

## 2021-01-22 VITALS — BP 146/74 | HR 57 | Ht 73.0 in | Wt 209.4 lb

## 2021-01-22 DIAGNOSIS — I10 Essential (primary) hypertension: Secondary | ICD-10-CM

## 2021-01-22 DIAGNOSIS — Z952 Presence of prosthetic heart valve: Secondary | ICD-10-CM | POA: Diagnosis not present

## 2021-01-22 DIAGNOSIS — Z8679 Personal history of other diseases of the circulatory system: Secondary | ICD-10-CM | POA: Diagnosis not present

## 2021-01-22 DIAGNOSIS — Z9889 Other specified postprocedural states: Secondary | ICD-10-CM

## 2021-01-22 DIAGNOSIS — Z01812 Encounter for preprocedural laboratory examination: Secondary | ICD-10-CM

## 2021-01-22 LAB — BASIC METABOLIC PANEL
BUN/Creatinine Ratio: 16 (ref 10–24)
BUN: 19 mg/dL (ref 8–27)
CO2: 24 mmol/L (ref 20–29)
Calcium: 9.6 mg/dL (ref 8.6–10.2)
Chloride: 102 mmol/L (ref 96–106)
Creatinine, Ser: 1.17 mg/dL (ref 0.76–1.27)
Glucose: 75 mg/dL (ref 70–99)
Potassium: 4.6 mmol/L (ref 3.5–5.2)
Sodium: 139 mmol/L (ref 134–144)
eGFR: 70 mL/min/{1.73_m2} (ref >59)

## 2021-01-22 LAB — CBC
Hematocrit: 44.7 % (ref 37.5–51.0)
Hemoglobin: 15 g/dL (ref 13.0–17.7)
MCH: 30.7 pg (ref 26.6–33.0)
MCHC: 33.6 g/dL (ref 31.5–35.7)
MCV: 92 fL (ref 79–97)
Platelets: 167 10*3/uL (ref 150–450)
RBC: 4.88 x10E6/uL (ref 4.14–5.80)
RDW: 12.7 % (ref 11.6–15.4)
WBC: 6.2 10*3/uL (ref 3.4–10.8)

## 2021-01-22 MED ORDER — SILDENAFIL CITRATE 100 MG PO TABS: 100.0000 mg | ORAL_TABLET | Freq: Every day | ORAL | 2 refills | Status: DC | PRN

## 2021-01-22 NOTE — H&P (View-Only) (Signed)
Cardiology Office Note   Date:  01/22/2021   ID:  Reginald Myers, DOB January 10, 1956, MRN [id number]  PCP:  Josetta Huddle, MD    No chief complaint on file.  S/p AVR, MVR  Wt Readings from Last 3 Encounters:  01/22/21 209 lb 6.4 oz (95 kg)  04/27/20 208 lb 12.8 oz (94.7 kg)  09/08/17 208 lb (94.3 kg)       History of Present Illness: Reginald Myers is a 65 y.o. male   who had an AVR , mitral valve ring placed and Maze procedure in 2014.   In 2019, he was started on some HTN meds.  BP was 299 systolic at Dr. Inda Merlin office. He had not been eating well since 11/18 and he thinks the BP is the result.  He was started on Exforge 5/160 mg and felt somewhat lightheaded.  Ultimately, went off of these meds, but had high BP at a MOHS procedure in 2022.   Amlodipine and losartan were restarted.    BPs at home after rest is in the 371-696 range systolic.  BP is high at the MD's office.    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 Elevated mean gradient 35 mmHg suggesting significant  bioprosthetic valve stenosis. The aortic valve has been repaired/replaced.  Aortic valve regurgitation is mild to  moderate. Aortic regurgitation PHT measures 475 msec." TEE recommended.  BP at home is in the 110-120/60-70.  Denies : Chest pain. Dizziness. Leg edema. Nitroglycerin use. Orthopnea. Palpitations. Paroxysmal nocturnal dyspnea. Shortness of breath. Syncope.    Past Medical History:  Diagnosis Date   Acute systolic heart failure ([place])    Aortic valve disorders    Atrial fibrillation (HCC)    Bell's palsy 09/2009   right-residual weakness with fatique    Dysrhythmia    Heart murmur    Hemorrhoids    Hyperlipidemia    Hypertension    Orthopnea    Right knee DJD    prior meniscal surgery   Shortness of breath    Testicular cancer ([place]) 1994    Past Surgical History:  Procedure Laterality Date   AORTIC VALVE  REPLACEMENT N/A 09/07/2012   Procedure: AORTIC VALVE REPLACEMENT (AVR);  Surgeon: Gaye Pollack, MD;  Location: [place];  Service: Open Heart Surgery;  Laterality: N/A;   CARDIAC CATHETERIZATION     CARDIOVERSION N/A 07/13/2012   Procedure: CARDIOVERSION;  Surgeon: [name], MD;  Location: [place];  Service: Cardiovascular;  Laterality: N/A;   CARDIOVERSION N/A 08/08/2012   Procedure: CARDIOVERSION;  Surgeon: [name], MD;  Location: [place];  Service: Cardiovascular;  Laterality: N/A;   CLIPPING OF ATRIAL APPENDAGE N/A 09/07/2012   Procedure: CLIPPING OF ATRIAL APPENDAGE;  Surgeon: Gaye Pollack, MD;  Location: [place];  Service: Open Heart Surgery;  Laterality: N/A;   INTRAOPERATIVE TRANSESOPHAGEAL ECHOCARDIOGRAM N/A 09/07/2012   Procedure: INTRAOPERATIVE TRANSESOPHAGEAL ECHOCARDIOGRAM;  Surgeon: Gaye Pollack, MD;  Location: [place] [facility];  Service: Open Heart Surgery;  Laterality: N/A;   KNEE ARTHROSCOPY W/ MENISCAL REPAIR Right 1988   LEFT AND RIGHT HEART CATHETERIZATION WITH CORONARY ANGIOGRAM N/A 08/15/2012   Procedure: LEFT AND RIGHT HEART CATHETERIZATION WITH CORONARY ANGIOGRAM;  Surgeon: [name], MD;  Location[facility] C[facility] LAB;  Service: Cardiovascular;  Laterality: N/A;   MAZE N/A 09/07/2012   Procedure: MAZE;  Surgeon: Gaye Pollack, MD;  Location: [place];  Service: Open Heart Surgery;  Laterality: N/A;   MITRAL VALVE REPAIR N/A 09/07/2012   Procedure: MITRAL VALVE REPAIR (MVR);  Surgeon: Gaye Pollack, MD;  Location: [place];  Service: Open Heart Surgery;  Laterality: N/A;   ORCHIECTOMY Left 1993   TEE WITHOUT CARDIOVERSION N/A 07/13/2012   Procedure: TRANSESOPHAGEAL ECHOCARDIOGRAM (TEE);  Surgeon: [name], MD;  Location: [facility] ENDOSCOPY;  Service: Cardiovascular;  Laterality: N/A;     Current Outpatient Medications  Medication Sig Dispense Refill   amLODipine (NORVASC) 5 MG tablet which pharmacy is     aspirin EC 81 MG tablet Take 1 tablet (81 mg  total) by mouth daily. Swallow whole. 90 tablet 3   atorvastatin (LIPITOR) 20 MG tablet Take 1 tablet (20 mg total) by mouth daily. 90 tablet 3   losartan (COZAAR) 50 MG tablet 1 tablet     metoprolol tartrate (LOPRESSOR) 100 MG tablet Take one tablet by mouth 2 hours prior to test 1 tablet 0   No current facility-administered medications for this visit.    Allergies:   Amiodarone and Statins    Social History:  The patient  reports that he has never smoked. He has never used smokeless tobacco. He reports current alcohol use. He reports that he does not use drugs.   Family History:  The patient's family history includes Diabetes in his paternal grandmother; Heart disease in his paternal grandfather; Pulmonary fibrosis in his father.    ROS:  Please see the history of present illness.   Otherwise, review of systems are positive for palpitations with excess caffeine.   All other systems are reviewed and negative.    PHYSICAL EXAM: VS:  BP (!) 146/74    Pulse (!) 57    Ht 6\' 1"  (1.854 m)    Wt 209 lb 6.4 oz (95 kg)    BMI 27.63 kg/m  , BMI Body mass index is 27.63 kg/m. GEN: Well nourished, well developed, in no acute distress HEENT: normal Neck: no JVD, carotid bruits, or masses Cardiac: RRR; no murmurs, rubs, or gallops,no edema  Respiratory:  clear to auscultation bilaterally, normal work of breathing GI: soft, nontender, nondistended, + BS MS: no deformity or atrophy Skin: warm and dry, no rash Neuro:  Strength and sensation are intact Psych: euthymic mood, full affect   EKG:   The ekg ordered today demonstrates regular rhythm, difficult to see P waves but likely sinus, narrow QRS   Recent Labs: 06/11/2020: BUN 19; Creatinine, Ser 1.22; Potassium 4.7; Sodium 139   Lipid Panel No results found for: CHOL, TRIG, HDL, CHOLHDL, VLDL, LDLCALC, LDLDIRECT   Other studies Reviewed: Additional studies/ records that were reviewed today with results demonstrating: LDL 59 in June  2022.   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  Plan for TEE with Dr. Audie Box.  He is familiar with the case and reviewed the prior scans.  HTN: Well-controlled at home. S/p Maze procedure: Difficult to see P waves on today's ECG.  He certainly is not in atrial fibrillation.  He did have a Maze procedure for A. fib at the time of his heart surgery.  I suspect they also oversewed the left atrial appendage.  We will confirm this on TEE. TEE will be done after Christmas.  All questions answered.  Erectile dysfunction: Okay to try sildenafil 50 to 100 mg 1 hour prior.   Current medicines are reviewed at length with the patient today.  The patient concerns regarding his medicines were  addressed.  The following changes have been made:  No change  Labs/ tests ordered today include:  No orders of the defined types were placed in this encounter.   Recommend 150 minutes/week of aerobic exercise Low fat, low carb, high fiber diet recommended  Disposition:   FU after TEE   Signed, Larae Grooms, MD  01/22/2021 8:44 AM    [facility] [place], [place], [place]  [address] Phone: [phone]; Fax: [phone]

## 2021-01-22 NOTE — Patient Instructions (Addendum)
Medication Instructions:  Your physician recommends that you continue on your current medications as directed. Please refer to the Current Medication list given to you today. A prescription for Sildenafil 100 mg has been sent to your pharmacy to use as needed.  Start with half tablet and increase to whole tablet if needed *If you need a refill on your cardiac medications before your next appointment, please call your pharmacy*   Lab Work: Lab work to be done today--CBC and BMP If you have labs (blood work) drawn today and your tests are completely normal, you will receive your results only by: [place] (if you have MyChart) OR A paper copy in the mail If you have any lab test that is abnormal or we need to change your treatment, we will call you to review the results.   Testing/Procedures: TEE scheduled for December 28,2022   Follow-Up: At [facility], you and your health needs are our priority.  As part of our continuing mission to provide you with exceptional heart care, we have created designated Provider Care Teams.  These Care Teams include your primary Cardiologist (physician) and Advanced Practice Providers (APPs -  Physician Assistants and Nurse Practitioners) who all work together to provide you with the care you need, when you need it.  We recommend signing up for the patient portal called "MyChart".  Sign up information is provided on this After Visit Summary.  MyChart is used to connect with patients for Virtual Visits (Telemedicine).  Patients are able to view lab/test results, encounter notes, upcoming appointments, etc.  Non-urgent messages can be sent to your provider as well.   To learn more about what you can do with MyChart, go to [email or web address].    Your next appointment:   April 27, 2021 at 8:00  The format for your next appointment:   In Person  Provider:   Larae Grooms, MD     Other Instructions   You are scheduled for a TEE on  December 28,2022 with Dr. Audie Box.  Please arrive at the [facility] (Main Entrance A) at [facility]: [place], [place] [address] at 9:30  am  DIET: Nothing to eat or drink after midnight except a sip of water with medications (see medication instructions below)  FYI: For your safety, and to allow [place] to monitor your vital signs accurately during the surgery/procedure we request that   if you have artificial nails, gel coating, SNS etc. Please have those removed prior to your surgery/procedure. Not having the nail coverings /polish removed may result in cancellation or delay of your surgery/procedure.    Labs: done in office on 12/16    You must have a responsible person to drive you home and stay in the waiting area during your procedure. Failure to do so could result in cancellation.  Bring your insurance cards.  *Special Note: Every effort is made to have your procedure done on time. Occasionally there are emergencies that occur at the hospital that may cause delays. Please be patient if a delay does occur.  car

## 2021-01-22 NOTE — Progress Notes (Signed)
Cardiology Office Note   Date:  01/22/2021   ID:  Reginald Myers, DOB 05-Jul-1955, MRN [id number]  PCP:  Josetta Huddle, MD    No chief complaint on file.  S/p AVR, MVR  Wt Readings from Last 3 Encounters:  01/22/21 209 lb 6.4 oz (95 kg)  04/27/20 208 lb 12.8 oz (94.7 kg)  09/08/17 208 lb (94.3 kg)       History of Present Illness: Reginald Myers is a 65 y.o. male   who had an AVR , mitral valve ring placed and Maze procedure in 2014.   In 2019, he was started on some HTN meds.  BP was 326 systolic at Dr. Inda Merlin office. He had not been eating well since 11/18 and he thinks the BP is the result.  He was started on Exforge 5/160 mg and felt somewhat lightheaded.  Ultimately, went off of these meds, but had high BP at a MOHS procedure in 2022.   Amlodipine and losartan were restarted.    BPs at home after rest is in the 712-458 range systolic.  BP is high at the MD's office.    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 Elevated mean gradient 35 mmHg suggesting significant  bioprosthetic valve stenosis. The aortic valve has been repaired/replaced.  Aortic valve regurgitation is mild to  moderate. Aortic regurgitation PHT measures 475 msec." TEE recommended.  BP at home is in the 110-120/60-70.  Denies : Chest pain. Dizziness. Leg edema. Nitroglycerin use. Orthopnea. Palpitations. Paroxysmal nocturnal dyspnea. Shortness of breath. Syncope.    Past Medical History:  Diagnosis Date   Acute systolic heart failure ([place])    Aortic valve disorders    Atrial fibrillation (HCC)    Bell's palsy 09/2009   right-residual weakness with fatique    Dysrhythmia    Heart murmur    Hemorrhoids    Hyperlipidemia    Hypertension    Orthopnea    Right knee DJD    prior meniscal surgery   Shortness of breath    Testicular cancer ([place]) 1994    Past Surgical History:  Procedure Laterality Date   AORTIC VALVE  REPLACEMENT N/A 09/07/2012   Procedure: AORTIC VALVE REPLACEMENT (AVR);  Surgeon: Gaye Pollack, MD;  Location: [place];  Service: Open Heart Surgery;  Laterality: N/A;   CARDIAC CATHETERIZATION     CARDIOVERSION N/A 07/13/2012   Procedure: CARDIOVERSION;  Surgeon: [name], MD;  Location: [place];  Service: Cardiovascular;  Laterality: N/A;   CARDIOVERSION N/A 08/08/2012   Procedure: CARDIOVERSION;  Surgeon: [name], MD;  Location: [place];  Service: Cardiovascular;  Laterality: N/A;   CLIPPING OF ATRIAL APPENDAGE N/A 09/07/2012   Procedure: CLIPPING OF ATRIAL APPENDAGE;  Surgeon: Gaye Pollack, MD;  Location: [place];  Service: Open Heart Surgery;  Laterality: N/A;   INTRAOPERATIVE TRANSESOPHAGEAL ECHOCARDIOGRAM N/A 09/07/2012   Procedure: INTRAOPERATIVE TRANSESOPHAGEAL ECHOCARDIOGRAM;  Surgeon: Gaye Pollack, MD;  Location: [place] [facility];  Service: Open Heart Surgery;  Laterality: N/A;   KNEE ARTHROSCOPY W/ MENISCAL REPAIR Right 1988   LEFT AND RIGHT HEART CATHETERIZATION WITH CORONARY ANGIOGRAM N/A 08/15/2012   Procedure: LEFT AND RIGHT HEART CATHETERIZATION WITH CORONARY ANGIOGRAM;  Surgeon: [name], MD;  Location: [facility] CATH LAB;  Service: Cardiovascular;  Laterality: N/A;   MAZE N/A 09/07/2012   Procedure: MAZE;  Surgeon: Gaye Pollack, MD;  Location: [place];  Service: Open Heart Surgery;  Laterality: N/A;   MITRAL VALVE REPAIR N/A 09/07/2012   Procedure: MITRAL VALVE REPAIR (MVR);  Surgeon: Gaye Pollack, MD;  Location: [place];  Service: Open Heart Surgery;  Laterality: N/A;   ORCHIECTOMY Left 1993   TEE WITHOUT CARDIOVERSION N/A 07/13/2012   Procedure: TRANSESOPHAGEAL ECHOCARDIOGRAM (TEE);  Surgeon: [name], MD;  Location: [facility] ENDOSCOPY;  Service: Cardiovascular;  Laterality: N/A;     Current Outpatient Medications  Medication Sig Dispense Refill   amLODipine (NORVASC) 5 MG tablet which pharmacy is     aspirin EC 81 MG tablet Take 1 tablet (81 mg  total) by mouth daily. Swallow whole. 90 tablet 3   atorvastatin (LIPITOR) 20 MG tablet Take 1 tablet (20 mg total) by mouth daily. 90 tablet 3   losartan (COZAAR) 50 MG tablet 1 tablet     metoprolol tartrate (LOPRESSOR) 100 MG tablet Take one tablet by mouth 2 hours prior to test 1 tablet 0   No current facility-administered medications for this visit.    Allergies:   Amiodarone and Statins    Social History:  The patient  reports that he has never smoked. He has never used smokeless tobacco. He reports current alcohol use. He reports that he does not use drugs.   Family History:  The patient's family history includes Diabetes in his paternal grandmother; Heart disease in his paternal grandfather; Pulmonary fibrosis in his father.    ROS:  Please see the history of present illness.   Otherwise, review of systems are positive for palpitations with excess caffeine.   All other systems are reviewed and negative.    PHYSICAL EXAM: VS:  BP (!) 146/74    Pulse (!) 57    Ht 6\' 1"  (1.854 m)    Wt 209 lb 6.4 oz (95 kg)    BMI 27.63 kg/m  , BMI Body mass index is 27.63 kg/m. GEN: Well nourished, well developed, in no acute distress HEENT: normal Neck: no JVD, carotid bruits, or masses Cardiac: RRR; no murmurs, rubs, or gallops,no edema  Respiratory:  clear to auscultation bilaterally, normal work of breathing GI: soft, nontender, nondistended, + BS MS: no deformity or atrophy Skin: warm and dry, no rash Neuro:  Strength and sensation are intact Psych: euthymic mood, full affect   EKG:   The ekg ordered today demonstrates regular rhythm, difficult to see P waves but likely sinus, narrow QRS   Recent Labs: 06/11/2020: BUN 19; Creatinine, Ser 1.22; Potassium 4.7; Sodium 139   Lipid Panel No results found for: CHOL, TRIG, HDL, CHOLHDL, VLDL, LDLCALC, LDLDIRECT   Other studies Reviewed: Additional studies/ records that were reviewed today with results demonstrating: LDL 59 in June  2022.   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  Plan for TEE with Dr. Audie Box.  He is familiar with the case and reviewed the prior scans.  HTN: Well-controlled at home. S/p Maze procedure: Difficult to see P waves on today's ECG.  He certainly is not in atrial fibrillation.  He did have a Maze procedure for A. fib at the time of his heart surgery.  I suspect they also oversewed the left atrial appendage.  We will confirm this on TEE. TEE will be done after Christmas.  All questions answered.  Erectile dysfunction: Okay to try sildenafil 50 to 100 mg 1 hour prior.   Current medicines are reviewed at length with the patient today.  The patient concerns regarding his medicines were  addressed.  The following changes have been made:  No change  Labs/ tests ordered today include:  No orders of the defined types were placed in this encounter.   Recommend 150 minutes/week of aerobic exercise Low fat, low carb, high fiber diet recommended  Disposition:   FU after TEE   Signed, Larae Grooms, MD  01/22/2021 8:44 AM    [facility] [place], [place], [place]  [address] Phone: [phone]; Fax: [phone]

## 2021-01-25 ENCOUNTER — Encounter: Payer: Self-pay | Admitting: Interventional Cardiology

## 2021-01-26 ENCOUNTER — Other Ambulatory Visit: Payer: Self-pay | Admitting: *Deleted

## 2021-01-26 MED ORDER — SILDENAFIL CITRATE 100 MG PO TABS: 100.0000 mg | ORAL_TABLET | Freq: Every day | ORAL | 2 refills | Status: DC | PRN

## 2021-02-02 ENCOUNTER — Other Ambulatory Visit: Payer: Self-pay | Admitting: Interventional Cardiology

## 2021-02-02 DIAGNOSIS — I351 Nonrheumatic aortic (valve) insufficiency: Secondary | ICD-10-CM

## 2021-02-03 ENCOUNTER — Encounter (HOSPITAL_COMMUNITY): Payer: Self-pay | Admitting: Cardiovascular Disease

## 2021-02-03 ENCOUNTER — Encounter (HOSPITAL_COMMUNITY)
Admission: RE | Disposition: A | Discharge status: Home / Self Care | Payer: Self-pay | Source: Home / Self Care | Attending: Cardiovascular Disease

## 2021-02-03 ENCOUNTER — Ambulatory Visit (HOSPITAL_BASED_OUTPATIENT_CLINIC_OR_DEPARTMENT_OTHER): Payer: BC Managed Care – PPO

## 2021-02-03 ENCOUNTER — Other Ambulatory Visit: Payer: Self-pay

## 2021-02-03 ENCOUNTER — Ambulatory Visit (HOSPITAL_COMMUNITY): Payer: BC Managed Care – PPO | Admitting: Anesthesiology

## 2021-02-03 ENCOUNTER — Ambulatory Visit (HOSPITAL_COMMUNITY)
Admission: RE | Admit: 2021-02-03 | Discharge: 2021-02-03 | Disposition: A | Discharge status: Home / Self Care | Payer: BC Managed Care – PPO | Attending: Cardiovascular Disease | Admitting: Cardiovascular Disease

## 2021-02-03 DIAGNOSIS — Z953 Presence of xenogenic heart valve: Secondary | ICD-10-CM | POA: Diagnosis not present

## 2021-02-03 DIAGNOSIS — I351 Nonrheumatic aortic (valve) insufficiency: Secondary | ICD-10-CM

## 2021-02-03 DIAGNOSIS — I110 Hypertensive heart disease with heart failure: Secondary | ICD-10-CM | POA: Insufficient documentation

## 2021-02-03 DIAGNOSIS — N529 Male erectile dysfunction, unspecified: Secondary | ICD-10-CM | POA: Diagnosis not present

## 2021-02-03 DIAGNOSIS — I4891 Unspecified atrial fibrillation: Secondary | ICD-10-CM | POA: Diagnosis not present

## 2021-02-03 DIAGNOSIS — I340 Nonrheumatic mitral (valve) insufficiency: Secondary | ICD-10-CM | POA: Diagnosis not present

## 2021-02-03 DIAGNOSIS — I5021 Acute systolic (congestive) heart failure: Secondary | ICD-10-CM | POA: Insufficient documentation

## 2021-02-03 DIAGNOSIS — I083 Combined rheumatic disorders of mitral, aortic and tricuspid valves: Secondary | ICD-10-CM | POA: Diagnosis not present

## 2021-02-03 HISTORY — PX: TEE WITHOUT CARDIOVERSION: SHX5443

## 2021-02-03 LAB — ECHO TEE
AR max vel: 1.52 cm2
AV Area VTI: 1.61 cm2
AV Mean grad: 28 mmHg
AV Peak grad: 56 mmHg
Ao pk vel: 3.74 m/s
MV VTI: 2.98 cm2
P 1/2 time: 619 msec

## 2021-02-03 SURGERY — ECHOCARDIOGRAM, TRANSESOPHAGEAL
Anesthesia: Monitor Anesthesia Care

## 2021-02-03 MED ORDER — LACTATED RINGERS IV SOLN: INTRAVENOUS | Status: DC | PRN

## 2021-02-03 MED ORDER — PROPOFOL 10 MG/ML IV BOLUS
INTRAVENOUS | Status: DC | PRN
  Administered 2021-02-03: 40 mg via INTRAVENOUS
  Administered 2021-02-03 (×3): 20 mg via INTRAVENOUS

## 2021-02-03 MED ORDER — LIDOCAINE 2% (20 MG/ML) 5 ML SYRINGE
INTRAMUSCULAR | Status: DC | PRN
  Administered 2021-02-03: 100 mg via INTRAVENOUS

## 2021-02-03 MED ORDER — SODIUM CHLORIDE 0.9 % IV SOLN: INTRAVENOUS | Status: DC

## 2021-02-03 MED ORDER — PROPOFOL 500 MG/50ML IV EMUL
INTRAVENOUS | Status: DC | PRN
  Administered 2021-02-03: 125 ug/kg/min via INTRAVENOUS

## 2021-02-03 MED ORDER — PHENYLEPHRINE 40 MCG/ML (10ML) SYRINGE FOR IV PUSH (FOR BLOOD PRESSURE SUPPORT)
PREFILLED_SYRINGE | INTRAVENOUS | Status: DC | PRN
  Administered 2021-02-03 (×2): 80 ug via INTRAVENOUS
  Administered 2021-02-03 (×2): 120 ug via INTRAVENOUS

## 2021-02-03 NOTE — CV Procedure (Signed)
° ° °  TRANSESOPHAGEAL ECHOCARDIOGRAM   NAME:  Reginald Myers    MRN: [id number] DOB:  [birth year]    ADMIT DATE: 02/03/2021  INDICATIONS: Prosthetic Valve dysfunction   PROCEDURE:   Informed consent was obtained prior to the procedure. The risks, benefits and alternatives for the procedure were discussed and the patient comprehended these risks.  Risks include, but are not limited to, cough, sore throat, vomiting, nausea, somnolence, esophageal and stomach trauma or perforation, bleeding, low blood pressure, aspiration, pneumonia, infection, trauma to the teeth and death.    Procedural time out performed. The oropharynx was anesthetized with topical 1% benzocaine.    Anesthesia was administered by Dr. Tobias Alexander.  The patient was administered 337 mg of propofol and 100 mg of lidocaine to achieve and maintain moderate conscious sedation.  The patient's heart rate, blood pressure, and oxygen saturation are monitored continuously during the procedure. The period of conscious sedation is 22 minutes, of which I was present face-to-face 100% of this time.   The transesophageal probe was inserted in the esophagus and stomach without difficulty and multiple views were obtained.   COMPLICATIONS:    There were no immediate complications.  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  Stable MV repair.  Normal LV/RV function.  Full report to follow. Further management per primary team.   [place] T. Audie Box, MD, [place]  [address], [place], [place] [address] [phone]  11:41 AM

## 2021-02-03 NOTE — Anesthesia Postprocedure Evaluation (Signed)
Anesthesia Post Note  Patient: Reginald Myers  Procedure(s) Performed: TRANSESOPHAGEAL ECHOCARDIOGRAM (TEE)     Patient location during evaluation: Endoscopy Anesthesia Type: MAC Level of consciousness: awake and alert Pain management: pain level controlled Vital Signs Assessment: post-procedure vital signs reviewed and stable Respiratory status: spontaneous breathing and respiratory function stable Cardiovascular status: stable Postop Assessment: no apparent nausea or vomiting Anesthetic complications: no   No notable events documented.  Last Vitals:  Vitals:   02/03/21 1155 02/03/21 1205  BP: (!) 106/47 (!) 108/42  Pulse: (!) 53 (!) 52  Resp: 16 16  Temp:    SpO2: 100% 99%    Last Pain:  Vitals:   02/03/21 1146  TempSrc: Temporal  PainSc: Asleep                 Emmanuel Gruenhagen DANIEL

## 2021-02-03 NOTE — Interval H&P Note (Signed)
History and Physical Interval Note:  02/03/2021 9:46 AM  Reginald Myers  has presented today for surgery, with the diagnosis of AORTIC INSUFFICIENCY.  The various methods of treatment have been discussed with the patient and family. After consideration of risks, benefits and other options for treatment, the patient has consented to  Procedure(s): TRANSESOPHAGEAL ECHOCARDIOGRAM (TEE) (N/A) as a surgical intervention.  The patient's history has been reviewed, patient examined, no change in status, stable for surgery.  I have reviewed the patient's chart and labs.  Questions were answered to the patient's satisfaction.    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 Question of paravalvular leak. Denies symptoms. NPO.   [place] T. Audie Box, MD, [place]  [address], [place], [place] [address] [phone]  9:47 AM

## 2021-02-03 NOTE — Transfer of Care (Signed)
Immediate Anesthesia Transfer of Care Note  Patient: Atlas Crossland  Procedure(s) Performed: TRANSESOPHAGEAL ECHOCARDIOGRAM (TEE)  Patient Location: Endoscopy Unit  Anesthesia Type:MAC  Level of Consciousness: awake and drowsy  Airway & Oxygen Therapy: Patient Spontanous Breathing and Patient connected to nasal cannula oxygen  Post-op Assessment: Report given to RN and Post -op Vital signs reviewed and stable  Post vital signs: Reviewed and stable  Last Vitals:  Vitals Value Taken Time  BP 109/65 02/03/21 1146  Temp    Pulse 59 02/03/21 1146  Resp 13 02/03/21 1146  SpO2 100 % 02/03/21 1146  Vitals shown include unvalidated device data.  Last Pain:  Vitals:   02/03/21 0945  TempSrc: Temporal  PainSc: 0-No pain         Complications: No notable events documented.

## 2021-02-03 NOTE — Anesthesia Preprocedure Evaluation (Addendum)
Anesthesia Evaluation  Patient identified by MRN, date of birth, ID band Patient awake    Reviewed: Allergy & Precautions, NPO status , Patient's Chart, lab work & pertinent test results  History of Anesthesia Complications Negative for: history of anesthetic complications  Airway Mallampati: II  TM Distance: >3 FB Neck ROM: Full    Dental no notable dental hx. (+) Dental Advisory Given   Pulmonary neg pulmonary ROS,    Pulmonary exam normal        Cardiovascular hypertension, Pt. on medications + dysrhythmias Atrial Fibrillation + Valvular Problems/Murmurs  Rhythm:Regular Rate:Normal + Diastolic murmurs IMPRESSIONS   1. Left ventricular ejection fraction, by estimation, is 60 to 65%. The left ventricle has normal function. The left ventricle has no regional wall motion abnormalities. Left ventricular diastolic parameters are indeterminate. 2. Right ventricular systolic function is normal. The right ventricular size is normal. There is mildly elevated pulmonary artery systolic pressure. The estimated right ventricular systolic pressure is 98.3 mmHg. 3. Left atrial size was mildly dilated. 4. Right atrial size was moderately dilated. 5. Status post mitral valve repair with trivial regurgitation and probably moderate stenosis (mean gradient 5 mmHg, MVA 1.06 cm^2 by VTI). 6. 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 Elevated mean gradient 35 mmHg suggesting significant bioprosthetic valve stenosis. Would consider TEE to assess the valve. 7. The inferior vena cava is normal in size with <50% respiratory variability, suggesting right atrial pressure of 8 mmHg.    Neuro/Psych negative neurological ROS     GI/Hepatic negative GI ROS, Neg liver ROS,   Endo/Other  negative endocrine ROS  Renal/GU negative Renal ROS     Musculoskeletal negative musculoskeletal ROS (+)   Abdominal   Peds   Hematology negative hematology ROS (+)   Anesthesia Other Findings   Reproductive/Obstetrics                            Anesthesia Physical Anesthesia Plan  ASA: 2  Anesthesia Plan: MAC   Post-op Pain Management:    Induction:   PONV Risk Score and Plan: TIVA and Treatment may vary due to age or medical condition  Airway Management Planned: Natural Airway and Simple Face Mask  Additional Equipment:   Intra-op Plan:   Post-operative Plan:   Informed Consent: I have reviewed the patients History and Physical, chart, labs and discussed the procedure including the risks, benefits and alternatives for the proposed anesthesia with the patient or authorized representative who has indicated his/her understanding and acceptance.     Dental advisory given  Plan Discussed with: Anesthesiologist, CRNA and Surgeon  Anesthesia Plan Comments:        Anesthesia Quick Evaluation

## 2021-02-03 NOTE — Anesthesia Procedure Notes (Signed)
Procedure Name: MAC Date/Time: 02/03/2021 11:12 AM Performed by: Ardyth Harps, CRNA Pre-anesthesia Checklist: Patient identified, Emergency Drugs available, Suction available and Patient being monitored Patient Re-evaluated:Patient Re-evaluated prior to induction Oxygen Delivery Method: Nasal cannula Preoxygenation: Pre-oxygenation with 100% oxygen Induction Type: IV induction Placement Confirmation: positive ETCO2 Dental Injury: Teeth and Oropharynx as per pre-operative assessment

## 2021-02-03 NOTE — Progress Notes (Signed)
*  PRELIMINARY RESULTS* Echocardiogram Echocardiogram Transesophageal has been performed.  Reginald Myers 02/03/2021, 12:00 PM

## 2021-02-03 NOTE — Discharge Instructions (Signed)
Electrical Cardioversion  Electrical cardioversion is the delivery of a jolt of electricity to restore a normal rhythm to the heart. A rhythm that is too fast or is not regular keeps the heart from pumping well. In this procedure, sticky patches or metal paddles are placed on the chest to deliver electricity to the heart from a device.  What can I expect after the procedure?  Your blood pressure, heart rate, breathing rate, and blood oxygen level will be monitored until you leave the hospital or clinic.  Your heart rhythm will be watched to make sure it does not change.  You may have some redness on the skin where the shocks were given.If this occurs, can use hydrocortisone cream or Aloe vera.  Follow these instructions at home:  Do not drive for 24 hours if you were given a sedative during your procedure.  Take over-the-counter and prescription medicines only as told by your health care provider.  Ask your health care provider how to check your pulse. Check it often.  Rest for 48 hours after the procedure or as told by your health care provider.  Avoid or limit your caffeine use as told by your health care provider.  Keep all follow-up visits as told by your health care provider. This is important.  Contact a health care provider if:  You feel like your heart is beating too quickly or your pulse is not regular.  You have a serious muscle cramp that does not go away.  Get help right away if:  You have discomfort in your chest.  You are dizzy or you feel faint.  You have trouble breathing or you are short of breath.  Your speech is slurred.  You have trouble moving an arm or leg on one side of your body.  Your fingers or toes turn cold or blue.  Summary  Electrical cardioversion is the delivery of a jolt of electricity to restore a normal rhythm to the heart.  This procedure may be done right away in an emergency or may be a scheduled procedure if the condition is not  an emergency.  Generally, this is a safe procedure.  After the procedure, check your pulse often as told by your health care provider.  This information is not intended to replace advice given to you by your health care provider. Make sure you discuss any questions you have with your health care provider. Document Revised: 08/27/2018 Document Reviewed: 08/27/2018 Elsevier Patient Education  2021 Elsevier Inc.  

## 2021-02-07 ENCOUNTER — Encounter (HOSPITAL_COMMUNITY): Payer: Self-pay | Admitting: Cardiovascular Disease

## 2021-02-09 ENCOUNTER — Other Ambulatory Visit: Payer: Self-pay | Admitting: *Deleted

## 2021-02-09 DIAGNOSIS — I351 Nonrheumatic aortic (valve) insufficiency: Secondary | ICD-10-CM

## 2021-02-09 DIAGNOSIS — Z952 Presence of prosthetic heart valve: Secondary | ICD-10-CM

## 2021-03-02 DIAGNOSIS — D122 Benign neoplasm of ascending colon: Secondary | ICD-10-CM | POA: Diagnosis not present

## 2021-03-02 DIAGNOSIS — Z1211 Encounter for screening for malignant neoplasm of colon: Secondary | ICD-10-CM | POA: Diagnosis not present

## 2021-03-02 DIAGNOSIS — D120 Benign neoplasm of cecum: Secondary | ICD-10-CM | POA: Diagnosis not present

## 2021-03-02 DIAGNOSIS — K5730 Diverticulosis of large intestine without perforation or abscess without bleeding: Secondary | ICD-10-CM | POA: Diagnosis not present

## 2021-04-02 ENCOUNTER — Other Ambulatory Visit: Payer: Self-pay | Admitting: Interventional Cardiology

## 2021-04-25 NOTE — Progress Notes (Signed)
?  ?Cardiology Office Note ? ? ?Date:  04/27/2021  ? ?ID:  Reginald Myers, DOB 11-06-1955, MRN [id number] ? ?PCP:  Josetta Huddle, MD  ? ? ?Chief Complaint  ?Patient presents with  ? Follow-up  ? ?S/p AVR/MVR ? ?Wt Readings from Last 3 Encounters:  ?04/27/21 213 lb (96.6 kg)  ?02/03/21 207 lb (93.9 kg)  ?01/22/21 209 lb 6.4 oz (95 kg)  ?  ? ?  ?History of Present Illness: ?Reginald Myers is a 66 y.o. male   who had an AVR , mitral valve ring placed and Maze procedure in 2014.  Dr. Cyndia Bent.  ?  ?In 2019, he was started on some HTN meds.  BP was 361 systolic at Dr. Inda Merlin office. He had not been eating well since 11/18 and he thinks the BP is the result.  He was started on Exforge 5/160 mg and felt somewhat lightheaded.  Ultimately, went off of these meds, but had high BP at a MOHS procedure in 2022. ?  ?Amlodipine and losartan were restarted.  ?  ?BPs at home after rest is in the 443-154 range systolic.  BP is high at the MD's office.  ?  ?Routine echo in 05/2020 showed: ?"Bioprosthetic aortic valve with mild-moderate peri-valvular  ?leakage. Elevated mean gradient 35 mmHg suggesting significant  ?bioprosthetic valve stenosis. The aortic valve has been repaired/replaced.  ?Aortic valve regurgitation is mild to  ?moderate. Aortic regurgitation PHT measures 475 msec." ?TEE recommended. ?  ?BP at home is in the 110-120/60-70.  Continues as of 2023. ? ?Blood pressure high in the MDs office.   ? ?Still continues to feel well.  He exercises regularly.  He walks vigorously.  He avoids heavy lifting type exercises. ? ?Denies : Chest pain. Dizziness. Leg edema. Nitroglycerin use. Orthopnea. Palpitations. Paroxysmal nocturnal dyspnea. Shortness of breath. Syncope.  ? ? ? ? ?Past Medical History:  ?Diagnosis Date  ? Acute systolic heart failure ([place])   ? Aortic valve disorders   ? Atrial fibrillation ([place])   ? Bell's palsy 09/2009  ? right-residual weakness with fatique   ? Dysrhythmia   ? Heart murmur   ? Hemorrhoids   ?  Hyperlipidemia   ? Hypertension   ? Orthopnea   ? Right knee DJD   ? prior meniscal surgery  ? Shortness of breath   ? Testicular cancer ([place]) 1994  ? ? ?Past Surgical History:  ?Procedure Laterality Date  ? AORTIC VALVE REPLACEMENT N/A 09/07/2012  ? Procedure: AORTIC VALVE REPLACEMENT (AVR);  Surgeon: Gaye Pollack, MD;  Location: [facility];  Service: Open Heart Surgery;  Laterality: N/A;  ? CARDIAC CATHETERIZATION    ? CARDIOVERSION N/A 07/13/2012  ? Procedure: CARDIOVERSION;  Surgeon: [name], MD;  Location: [place];  Service: Cardiovascular;  Laterality: N/A;  ? CARDIOVERSION N/A 08/08/2012  ? Procedure: CARDIOVERSION;  Surgeon: [name], MD;  Location: [place];  Service: Cardiovascular;  Laterality: N/A;  ? CLIPPING OF ATRIAL APPENDAGE N/A 09/07/2012  ? Procedure: CLIPPING OF ATRIAL APPENDAGE;  Surgeon: Gaye Pollack, MD;  Location: [place];  Service: Open Heart Surgery;  Laterality: N/A;  ? INTRAOPERATIVE TRANSESOPHAGEAL ECHOCARDIOGRAM N/A 09/07/2012  ? Procedure: INTRAOPERATIVE TRANSESOPHAGEAL ECHOCARDIOGRAM;  Surgeon: Gaye Pollack, MD;  Location: [facility] OR;  Service: Open Heart Surgery;  Laterality: N/A;  ? KNEE ARTHROSCOPY W/ MENISCAL REPAIR Right 1988  ? LEFT AND RIGHT HEART CATHETERIZATION WITH CORONARY ANGIOGRAM N/A 08/15/2012  ? Procedure: LEFT AND RIGHT HEART  CATHETERIZATION WITH CORONARY ANGIOGRAM;  Surgeon: [name], MD;  Location: [facility] CATH LAB;  Service: Cardiovascular;  Laterality: N/A;  ? MAZE N/A 09/07/2012  ? Procedure: MAZE;  Surgeon: Gaye Pollack, MD;  Location: [place];  Service: Open Heart Surgery;  Laterality: N/A;  ? MITRAL VALVE REPAIR N/A 09/07/2012  ? Procedure: MITRAL VALVE REPAIR (MVR);  Surgeon: Gaye Pollack, MD;  Location: [place];  Service: Open Heart Surgery;  Laterality: N/A;  ? ORCHIECTOMY Left 1993  ? TEE WITHOUT CARDIOVERSION N/A 07/13/2012  ? Procedure: TRANSESOPHAGEAL ECHOCARDIOGRAM (TEE);  Surgeon: [name], MD;  Location: [place];   Service: Cardiovascular;  Laterality: N/A;  ? TEE WITHOUT CARDIOVERSION N/A 02/03/2021  ? Procedure: TRANSESOPHAGEAL ECHOCARDIOGRAM (TEE);  Surgeon: Geralynn Rile, MD;  Location: [place];  Service: Cardiovascular;  Laterality: N/A;  ? ? ? ?Current Outpatient Medications  ?Medication Sig Dispense Refill  ? amLODipine (NORVASC) 5 MG tablet Take 5 mg by mouth daily.    ? aspirin EC 81 MG tablet Take 1 tablet (81 mg total) by mouth daily. Swallow whole. (Patient taking differently: Take 81 mg by mouth 2 (two) times a week. Swallow whole.) 90 tablet 3  ? atorvastatin (LIPITOR) 20 MG tablet TAKE 1 TABLET(20 MG) BY MOUTH DAILY 90 tablet 3  ? losartan (COZAAR) 50 MG tablet Take 50 mg by mouth daily.    ? sildenafil (VIAGRA) 100 MG tablet Take 1 tablet (100 mg total) by mouth daily as needed for erectile dysfunction. 10 tablet 2  ? ?No current facility-administered medications for this visit.  ? ? ?Allergies:   Amiodarone and Statins  ? ? ?Social History:  The patient  reports that he has never smoked. He has never used smokeless tobacco. He reports current alcohol use. He reports that he does not use drugs.  ? ?Family History:  The patient's family history includes Diabetes in his paternal grandmother; Heart disease in his paternal grandfather; Pulmonary fibrosis in his father.  ? ? ?ROS:  Please see the history of present illness.   Otherwise, review of systems are positive for feeling well.   All other systems are reviewed and negative.  ? ? ?PHYSICAL EXAM: ?VS:  BP (!) 152/82   Pulse 70   Ht '6\' 1"'$  (1.854 m)   Wt 213 lb (96.6 kg)   SpO2 97%   BMI 28.10 kg/m?  , BMI Body mass index is 28.1 kg/m?. ?GEN: Well nourished, well developed, in no acute distress ?HEENT: normal ?Neck: no JVD, carotid bruits, or masses ?Cardiac: RRR; 2 out of 6 systolic, 2 out of 6 early to mid diastolic murmurs, rubs, or gallops,no edema  ?Respiratory:  clear to auscultation bilaterally, normal work of breathing ?GI: soft,  nontender, nondistended, + BS ?MS: no deformity or atrophy; 2+ right PT pulse, 2+ right radial pulse ?Skin: warm and dry, no rash ?Neuro:  Strength and sensation are intact ?Psych: euthymic mood, full affect ? ? ? ?Recent Labs: ?01/22/2021: BUN 19; Creatinine, Ser 1.17; Hemoglobin 15.0; Platelets 167; Potassium 4.6; Sodium 139  ? ?Lipid Panel ?No results found for: CHOL, TRIG, HDL, CHOLHDL, VLDL, LDLCALC, LDLDIRECT ?  ?Other studies Reviewed: ?Additional studies/ records that were reviewed today with results demonstrating: Echo results reviewed.  LDL 59 in June 2022 ? ? ?ASSESSMENT AND PLAN: ? ?S/p AVR/MVR: Surgery done in 2014.  Had TEE done in December 2022 with Dr. Audie Box.  No sx but will need some valve intervention, hopefully TAVR when he has  sx. will refer back to Dr. Cyndia Bent so that he can discuss options.  I had a long discussion with the family who is with him; His wife, and his daughter who is a Careers information officer.  They are wondering about whether a mechanical valve should be considered in the future if a repeat operation is needed.  They are wondering whether TAVR would be a possibility. ?HTN: Low sodium diet.  Readings controlled at home. ?S/p Maze: No atrial fibrillation.  He does note that he can get his heart rate up as high now when he exercises but he can still get to the 140s. ?Erectile dysfunction: Not discussed today. ?Hyperlipidemia: Continue atorvastatin.  ? ?We discussed to watch for symptoms of heart failure, lightheadedness, syncope.  We also discussed pain associated with aneurysm rupture.  His aorta measured at 43 mm back in June 2022.  We consider repeat CT scan in June depending on what was discussed with his visit with Dr. Cyndia Bent. ? ? ?Current medicines are reviewed at length with the patient today.  The patient concerns regarding his medicines were addressed. ? ?The following changes have been made:  No change ? ?Labs/ tests ordered today include:  ?No orders of the defined types were  placed in this encounter. ? ? ?Recommend 150 minutes/week of aerobic exercise ?Low fat, low carb, high fiber diet recommended ? ?Disposition:   FU in 6 months ? ? ?Signed, ?Larae Grooms, MD  ?04/27/2021 8:19 AM    ?Larence Penning

## 2021-04-27 ENCOUNTER — Other Ambulatory Visit: Payer: Self-pay

## 2021-04-27 ENCOUNTER — Encounter: Payer: Self-pay | Admitting: Interventional Cardiology

## 2021-04-27 ENCOUNTER — Ambulatory Visit (INDEPENDENT_AMBULATORY_CARE_PROVIDER_SITE_OTHER): Payer: BC Managed Care – PPO | Admitting: Interventional Cardiology

## 2021-04-27 VITALS — BP 152/82 | HR 70 | Ht 73.0 in | Wt 213.0 lb

## 2021-04-27 DIAGNOSIS — Z952 Presence of prosthetic heart valve: Secondary | ICD-10-CM | POA: Diagnosis not present

## 2021-04-27 DIAGNOSIS — I10 Essential (primary) hypertension: Secondary | ICD-10-CM | POA: Diagnosis not present

## 2021-04-27 DIAGNOSIS — Z8679 Personal history of other diseases of the circulatory system: Secondary | ICD-10-CM

## 2021-04-27 DIAGNOSIS — I351 Nonrheumatic aortic (valve) insufficiency: Secondary | ICD-10-CM | POA: Diagnosis not present

## 2021-04-27 DIAGNOSIS — Z9889 Other specified postprocedural states: Secondary | ICD-10-CM | POA: Diagnosis not present

## 2021-04-27 NOTE — Patient Instructions (Signed)
Medication Instructions:  ?Your physician recommends that you continue on your current medications as directed. Please refer to the Current Medication list given to you today. ? ?*If you need a refill on your cardiac medications before your next appointment, please call your pharmacy* ? ? ?Lab Work: ?none ?If you have labs (blood work) drawn today and your tests are completely normal, you will receive your results only by: ?MyChart Message (if you have MyChart) OR ?A paper copy in the mail ?If you have any lab test that is abnormal or we need to change your treatment, we will call you to review the results. ? ? ?Testing/Procedures: ?Your physician has requested that you have an echocardiogram. Echocardiography is a painless test that uses sound waves to create images of your heart. It provides your doctor with information about the size and shape of your heart and how well your heart?s chambers and valves are working. This procedure takes approximately one hour. There are no restrictions for this procedure. ?To be done in December 2023 ? ? ?Follow-Up: ?At [facility], you and your health needs are our priority.  As part of our continuing mission to provide you with exceptional heart care, we have created designated Provider Care Teams.  These Care Teams include your primary Cardiologist (physician) and Advanced Practice Providers (APPs -  Physician Assistants and Nurse Practitioners) who all work together to provide you with the care you need, when you need it. ? ?We recommend signing up for the patient portal called "MyChart".  Sign up information is provided on this After Visit Summary.  MyChart is used to connect with patients for Virtual Visits (Telemedicine).  Patients are able to view lab/test results, encounter notes, upcoming appointments, etc.  Non-urgent messages can be sent to your provider as well.   ?To learn more about what you can do with MyChart, go to [email or web address].   ? ?Your next  appointment:   ?6 month(s) ? ?The format for your next appointment:   ?In Person ? ?Provider:   ?Larae Grooms, MD   ? ? ?Other Instructions ?You have been referred to Dr Cyndia Bent at [facility].  Office will contact you to schedule appointment ? ? ?

## 2021-04-30 ENCOUNTER — Telehealth: Payer: Self-pay

## 2021-04-30 NOTE — Telephone Encounter (Signed)
Late entry from 3/23 at 1620: ? ?Called the patient to schedule a Structural appointment with Dr. Burt Knack. ?The patient was adamant about seeing Dr. Cyndia Bent only and stated he was not interested in TAVR. ?He understands [facility] will call to schedule an appointment. ?

## 2021-06-03 ENCOUNTER — Encounter: Payer: BC Managed Care – PPO | Admitting: Surgery

## 2021-08-18 ENCOUNTER — Encounter: Payer: Self-pay | Admitting: Surgery

## 2021-08-18 ENCOUNTER — Institutional Professional Consult (permissible substitution) (INDEPENDENT_AMBULATORY_CARE_PROVIDER_SITE_OTHER): Payer: BC Managed Care – PPO | Admitting: Surgery

## 2021-08-18 VITALS — BP 168/81 | HR 57 | Resp 20 | Ht 73.0 in | Wt 208.0 lb

## 2021-08-18 DIAGNOSIS — I351 Nonrheumatic aortic (valve) insufficiency: Secondary | ICD-10-CM | POA: Diagnosis not present

## 2021-08-18 NOTE — Progress Notes (Signed)
Cardiothoracic Surgery Consultation  PCP is Josetta Huddle, MD Referring Provider is [name], MD  Chief Complaint  Patient presents with   Aortic Insuffiency    Initial surgical consult, ECHO 1/3, TEE 12/28, hx of AVR/MVR 2014    HPI:  The patient is a 66 year old gentleman with a history of hypertension, hyperlipidemia, atrial fibrillation, who underwent aortic valve replacement by me in August 2014 using a 25 mm Magna-ease pericardial valve with mitral annuloplasty using a 30 mm ring and biatrial MAZE IV procedure with ligation of the left atrial appendage.  He has done well over the years and has been followed by Dr. [name].  A 2D echo in April 2022 was felt to show mild to moderate aortic insufficiency with a pressure half-time of 475 ms.  The mean gradient was 33 mmHg with a peak gradient of 69 mmHg.  Aortic valve area was measured at 0.6 cm.  Left ventricular ejection fraction was 60 to 65%.  He subsequently underwent a gated cardiac CTA on 07/09/2020 which showed restricted movement of the leaflets in systole with thickening and calcification.  The leaflet of the RCC was retracted with incomplete leaflet coaptation which was felt to be the source of central regurgitation.  There was no evidence of paravalvular leak.  The aortic root was measured at 4.3 cm which was unchanged from previously.  Coronary calcium score was 471 with mild disease in the LAD.  A TEE was done on 02/03/2021 which showed some thickening of the aortic valve leaflets.  There was mild restriction in systole.  There was no evidence of vegetation.  There was concern for diastolic flow reversal in the descending aorta.  The mean gradient across the aortic valve is 28 mmHg with what was felt to be moderate to severe aortic insufficiency related to valve deterioration.  The pressure half-time was measured at 619 ms.  Left ventricular ejection fraction and cavity size were normal.  He continues to feel well and  is exercising regularly without any symptoms.  He denies any shortness of breath or chest discomfort.  His energy level has been normal.  He denies peripheral edema and orthopnea.  He has had no dizziness or syncope.  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    Past Surgical History:  Procedure Laterality Date   AORTIC VALVE REPLACEMENT N/A 09/07/2012   Procedure: AORTIC VALVE REPLACEMENT (AVR);  Surgeon: Gaye Pollack, MD;  Location: [place];  Service: Open Heart Surgery;  Laterality: N/A;   CARDIAC CATHETERIZATION     CARDIOVERSION N/A 07/13/2012   Procedure: CARDIOVERSION;  Surgeon: [name], MD;  Location: [place];  Service: Cardiovascular;  Laterality: N/A;   CARDIOVERSION N/A 08/08/2012   Procedure: CARDIOVERSION;  Surgeon: [name], MD;  Location: [place];  Service: Cardiovascular;  Laterality: N/A;   CLIPPING OF ATRIAL APPENDAGE N/A 09/07/2012   Procedure: CLIPPING OF ATRIAL APPENDAGE;  Surgeon: Gaye Pollack, MD;  Location: [place];  Service: Open Heart Surgery;  Laterality: N/A;  INTRAOPERATIVE TRANSESOPHAGEAL ECHOCARDIOGRAM N/A 09/07/2012   Procedure: INTRAOPERATIVE TRANSESOPHAGEAL ECHOCARDIOGRAM;  Surgeon: Gaye Pollack, MD;  Location: [facility] OR;  Service: Open Heart Surgery;  Laterality: N/A;   KNEE ARTHROSCOPY W/ MENISCAL REPAIR Right 1988   LEFT AND RIGHT HEART CATHETERIZATION WITH CORONARY ANGIOGRAM N/A 08/15/2012   Procedure: LEFT AND RIGHT HEART CATHETERIZATION WITH CORONARY ANGIOGRAM;  Surgeon: [name], MD;  Location: [facility] CATH LAB;  Service: Cardiovascular;  Laterality: N/A;   MAZE N/A 09/07/2012    Procedure: MAZE;  Surgeon: Gaye Pollack, MD;  Location: [place];  Service: Open Heart Surgery;  Laterality: N/A;   MITRAL VALVE REPAIR N/A 09/07/2012   Procedure: MITRAL VALVE REPAIR (MVR);  Surgeon: Gaye Pollack, MD;  Location: [place];  Service: Open Heart Surgery;  Laterality: N/A;   ORCHIECTOMY Left 1993   TEE WITHOUT CARDIOVERSION N/A 07/13/2012   Procedure: TRANSESOPHAGEAL ECHOCARDIOGRAM (TEE);  Surgeon: [name], MD;  Location: [place];  Service: Cardiovascular;  Laterality: N/A;   TEE WITHOUT CARDIOVERSION N/A 02/03/2021   Procedure: TRANSESOPHAGEAL ECHOCARDIOGRAM (TEE);  Surgeon: Geralynn Rile, MD;  Location: [place];  Service: Cardiovascular;  Laterality: N/A;    Family History  Problem Relation Age of Onset   Pulmonary fibrosis Father    Diabetes Paternal Grandmother    Heart disease Paternal Grandfather    Heart attack Neg Hx     Social History Social History   Tobacco Use   Smoking status: Never   Smokeless tobacco: Never  Vaping Use   Vaping Use: Never used  Substance Use Topics   Alcohol use: Yes    Comment: 77/2014 "glass of wine couple times a month"   Drug use: No    Current Outpatient Medications  Medication Sig Dispense Refill   amLODipine (NORVASC) 5 MG tablet Take 5 mg by mouth daily.     aspirin EC 81 MG tablet Take 1 tablet (81 mg total) by mouth daily. Swallow whole. (Patient taking differently: Take 81 mg by mouth 2 (two) times a week. Swallow whole.) 90 tablet 3   atorvastatin (LIPITOR) 20 MG tablet TAKE 1 TABLET(20 MG) BY MOUTH DAILY 90 tablet 3   losartan (COZAAR) 50 MG tablet Take 50 mg by mouth daily.     sildenafil (VIAGRA) 100 MG tablet Take 1 tablet (100 mg total) by mouth daily as needed for erectile dysfunction. 10 tablet 2   No current facility-administered medications for this visit.    Allergies  Allergen Reactions   Amiodarone Other (See Comments)    Intolerance due to nausea Other reaction(s): upset  stomach   Statins Other (See Comments)    Muscle aches  - currently taking a Statin drug Other reaction(s): not tolerated    Review of Systems  Constitutional:  Negative for activity change, chills, fatigue and fever.  HENT: Negative.    Eyes: Negative.   Respiratory:  Negative for chest tightness and shortness of breath.   Cardiovascular:  Negative for chest pain, palpitations and leg swelling.  Gastrointestinal: Negative.   Endocrine: Negative.   Genitourinary: Negative.   Musculoskeletal: Negative.   Skin: Negative.   Allergic/Immunologic: Negative.   Neurological:  Negative for dizziness and syncope.  Hematological: Negative.   Psychiatric/Behavioral: Negative.      BP (!) 168/81 (BP Location: Left Arm, Patient Position: Sitting)   Pulse (!) 57   Resp 20   Ht '6\' 1"'$  (1.854 m)   Wt 208 lb (94.3 kg)   SpO2 100% Comment: RA  BMI 27.44 kg/m  Physical Exam Constitutional:      Appearance: Normal appearance. He is normal weight.  HENT:     Head: Normocephalic and atraumatic.  Eyes:     Extraocular Movements: Extraocular movements intact.     Conjunctiva/sclera: Conjunctivae normal.     Pupils: Pupils are equal, round, and reactive to light.  Neck:     Vascular: No carotid bruit.  Cardiovascular:     Rate and Rhythm: Normal rate and regular rhythm.     Heart sounds: Murmur heard.     Comments: 2/6 systolic and diastolic murmur RSB Pulmonary:     Effort: Pulmonary effort is normal.     Breath sounds: Normal breath sounds.  Abdominal:     General: There is no distension.     Tenderness: There is no abdominal tenderness.  Musculoskeletal:        General: No swelling. Normal range of motion.     Cervical back: Normal range of motion and neck supple.  Skin:    General: Skin is warm and dry.  Neurological:     General: No focal deficit present.     Mental Status: He is alert and oriented to person, place, and time.  Psychiatric:        Mood and Affect: Mood normal.         Behavior: Behavior normal.      Diagnostic Tests:  TRANSESOPHOGEAL ECHO REPORT         Patient Name:   Reginald Myers Date of Exam: 02/03/2021  Medical Rec #:  [id number]        Height:       73.0 in  Accession #:    [id number]       Weight:       207.0 lb  Date of Birth:  03-05-1955       BSA:          2.183 m  Patient Age:    32 years         BP:           123/60 mmHg  Patient Gender: M                HR:           75 bpm.  Exam Location:  Inpatient   Procedure: Transesophageal Echo, Cardiac Doppler, Color Doppler and 3D  Echo   Indications:     Aortic valve disease     History:         Patient has prior history of Echocardiogram examinations,  most                   recent 05/20/2020. Arrythmias:Atrial Fibrillation. AVR.  MVR. S/P                   Maze procedure.                   Aortic Valve: 25 mm pericardial valve is present in the  aortic                   position. Procedure Date: 09/08/2012.                   Mitral Valve: 30 mm prosthetic annuloplasty ring valve is                   present in the mitral position. Procedure Date: 09/08/2012.  Sonographer:     Clayton Lefort RDCS (AE)  Referring Phys:  [place]  Diagnosing Phys: Eleonore Chiquito MD   PROCEDURE: After discussion of the risks and benefits of a TEE, an  informed consent was obtained from the patient. TEE procedure time was 22  minutes. The transesophogeal probe was passed without difficulty through  the esophogus of the patient. Imaged  were obtained with the patient in a left lateral decubitus position.  Sedation performed by different physician. The patient was monitored while  under deep sedation. Anesthestetic sedation was provided intravenously by  Anesthesiology: '337mg'$  of Propofol,  '100mg'$  of Lidocaine. Image quality was excellent. The patient's vital  signs; including heart rate, blood pressure, and oxygen saturation;  remained stable throughout the procedure. The patient  developed no  complications during the procedure.   IMPRESSIONS     1. 25 mm Magna ease pericardial valve in the aortic position (09/08/2012).  There is moderate to severe central aortic regurgitation. Quantitation  difficult due to shadowing from the prosthetic valve. Suspect this is  related to deterioration as the  leaflets appear thickened. The leaflets are mildly restricted in systole.  There is no evidence of vegetation or endocarditis. There is concerns for  diastolic flow reveral in the descending aorta. The valve leaflets open  well and suspect that the increase   gradient across the valve is partially explained by AI. Vmax 3.7 m/s, MG  28 mmHG. Overall, suspect there is moderate to severe AI related to valve  deterioration. The LV cavity is normal size and but the holodiastolic flow  reversal does suggest severe  AI.Marland Kitchen The aortic valve has been repaired/replaced. Aortic valve  regurgitation is moderate to severe. There is a 25 mm pericardial valve  present in the aortic position. Procedure Date: 09/08/2012.   2. Left ventricular ejection fraction, by estimation, is 60 to 65%. The  left ventricle has normal function. The left ventricle has no regional  wall motion abnormalities.   3. Right ventricular systolic function is normal. The right ventricular  size is normal.   4. S/p surgical LAA closure. No left atrial/left atrial appendage  thrombus was detected.   5. The mitral valve has been repaired/replaced. Mild mitral valve  regurgitation. There is a 30 mm prosthetic annuloplasty ring present in  the mitral position. Procedure Date: 09/08/2012. Echo findings are  consistent with normal structure and function of  the mitral valve prosthesis.   6. Aortic dilatation noted. There is mild dilatation of the aortic root,  measuring 40 mm.   FINDINGS   Left Ventricle: Left ventricular ejection fraction, by estimation, is 60  to 65%. The left ventricle has normal function. The left  ventricle has no  regional wall motion abnormalities. The left ventricular internal cavity  size was normal in size.   Right Ventricle: The right ventricular size is normal. No increase in  right ventricular wall thickness. Right ventricular systolic function is  normal.   Left Atrium: S/p surgical LAA closure. Left atrial size was normal in  size. No left atrial/left atrial appendage thrombus was detected.   Right Atrium: Right atrial size was normal in size.   Pericardium: There is no evidence of pericardial effusion.   Mitral Valve: The mitral valve has been repaired/replaced. Mild mitral  valve regurgitation. There is a 30 mm prosthetic annuloplasty ring present  in the mitral position. Procedure Date: 09/08/2012. Echo findings are  consistent with normal structure and  function of the mitral  valve prosthesis. The mean mitral valve gradient is  5.4 mmHg with average heart rate of 69 bpm.   Tricuspid Valve: The tricuspid valve is grossly normal. Tricuspid valve  regurgitation is trivial. No evidence of tricuspid stenosis.   Aortic Valve: 25 mm Magna ease pericardial valve in the aortic position  (09/08/2012). There is moderate to severe central aortic regurgitation.  Quantitation difficult due to shadowing from the prosthetic valve. Suspect  this is related to deterioration as  the leaflets appear thickened. The leaflets are mildly restricted in  systole. There is no evidence of vegetation or endocarditis. There is  concerns for diastolic flow reveral in the descending aorta. The valve  leaflets open well and suspect that the  increase gradient across the valve is partially explained by AI. Vmax 3.7  m/s, MG 28 mmHG. Overall, suspect there is moderate to severe AI related  to valve deterioration. The LV cavity is normal size and but the  holodiastolic flow reversal does suggest  severe AI. The aortic valve has been repaired/replaced. Aortic valve  regurgitation is moderate to  severe. Aortic regurgitation PHT measures 619  msec. Aortic valve mean gradient measures 28.0 mmHg. Aortic valve peak  gradient measures 56.0 mmHg. Aortic  valve area, by VTI measures 1.61 cm. There is a 25 mm pericardial valve  present in the aortic position. Procedure Date: 09/08/2012.   Pulmonic Valve: The pulmonic valve was grossly normal. Pulmonic valve  regurgitation is not visualized. No evidence of pulmonic stenosis.   Aorta: Aortic dilatation noted. There is mild dilatation of the aortic  root, measuring 40 mm.   Venous: The left upper pulmonary vein, left lower pulmonary vein, right  upper pulmonary vein and right lower pulmonary vein are normal.   IAS/Shunts: The atrial septum is grossly normal.      LEFT VENTRICLE  PLAX 2D  LVOT diam:     2.50 cm  LV SV:         135  LV SV Index:   62  LVOT Area:     4.91 cm      AORTIC VALVE  AV Area (Vmax):    1.52 cm  AV Area (VTI):     1.61 cm  AV Vmax:           374.00 cm/s  AV Vmean:          199.900 cm/s  AV VTI:            0.839 m  AV Peak Grad:      56.0 mmHg  AV Mean Grad:      28.0 mmHg  LVOT Vmax:         116.00 cm/s  LVOT VTI:          0.276 m  LVOT/AV VTI ratio: 0.33  AI PHT:            619 msec     AORTA  Ao Root diam: 4.00 cm  Ao Asc diam:  3.40 cm   MITRAL VALVE  MV Area VTI:  2.98 cm  SHUNTS  MV Mean grad: 5.4 mmHg  Systemic VTI:  0.28 m  MV VTI:       0.45 m    Systemic Diam: 2.50 cm   Eleonore Chiquito MD  Electronically signed by Eleonore Chiquito MD  Signature Date/Time: 02/03/2021/2:11:22 PM         Final     Impression:  This 66 year old gentleman is now 9 years out from aortic valve  replacement using a bioprosthetic valve.  He also had mitral annuloplasty and MAZE procedure.  He now has signs of structural valve deterioration on echo and gated cardiac CTA with what is felt to be severe central aortic insufficiency although his pressure half-time is 619 ms.  There was concern for diastolic flow  reversal in the descending aorta.  Visually the aortic insufficiency looks moderate to me.  He has normal left ventricular systolic function and normal LV internal dimensions.  The aortic root is mildly dilated at 4.3 cm on cardiac CT which is unchanged from previous studies.  He had bicuspid aortic valve disease.  He continues to feel well and is completely asymptomatic and exercises regularly.  I do not think there is any indication for redo aortic valve replacement at this time.  I reviewed the cardiac CT and TEE images with him and answered all of his questions.  I recommended continued close follow-up with periodic echocardiograms to reassess his LV function and internal dimensions.  I think if he required surgery in the near future that open surgical aortic valve replacement would probably be the best option for him given his relatively young age.  I discussed the symptoms of aortic insufficiency with him and asked him to contact Dr. [name] or myself if he develops any of these since that would be an indication for surgical treatment.   Plan:  He will continue to follow-up closely with Dr. [name] with an echocardiogram every 6 months.  I will be happy to see him back when he develops indications for surgical treatment of his prosthetic aortic valve structural deterioration.  He is in full agreement with that plan.  I spent 45 minutes performing this consultation and > 50% of this time was spent face to face counseling and coordinating the care of this patient's prosthetic aortic valve insufficiency.    Gaye Pollack, MD [facility] [phone]

## 2021-08-30 ENCOUNTER — Telehealth: Payer: Self-pay | Admitting: *Deleted

## 2021-08-30 NOTE — Chronic Care Management (AMB) (Signed)
  Care Coordination  Note  08/30/2021 Name: Reginald Myers MRN: [id number] DOB: [birth year]  [name] is a 66 y.o. year old male who is a primary care patient of Josetta Huddle, MD. I reached out to [name] by phone today to offer care coordination services.       Follow up plan: Unsuccessful telephone outreach attempt made. A HIPAA compliant phone message was left for the patient providing contact information and requesting a return call.   Julian Hy, [place] Direct Dial: [phone]

## 2021-09-24 NOTE — Chronic Care Management (AMB) (Signed)
  Care Coordination   Note   09/24/2021 Name: Reginald Myers MRN: [id number] DOB: 07-30-1955  Reginald Myers is a 65 y.o. year old male who sees Reginald Huddle, MD for primary care. I reached out to Reginald Myers by phone today to offer care coordination services.  Reginald Myers was given information about Care Coordination services today including:   The Care Coordination services include support from the care team which includes your Nurse Coordinator, Clinical Social Worker, or Pharmacist.  The Care Coordination team is here to help remove barriers to the health concerns and goals most important to you. Care Coordination services are voluntary, and the patient may decline or stop services at any time by request to their care team member.   Care Coordination Consent Status: Patient did not agree to participate in care coordination services at this time.  Follow up plan:  none  Encounter Outcome:  Pt. Refused  Julian Hy, [place] Direct Dial: [phone]

## 2021-09-24 NOTE — Chronic Care Management (AMB) (Signed)
  Care Coordination  Outreach Note  09/24/2021 Name: Lennyn Bellanca MRN: [id number] DOB: 1955-11-22   Care Coordination Outreach Attempts  A second unsuccessful outreach was attempted today to offer the patient with information about available care coordination services as a benefit of their health plan.     Follow Up Plan:  Additional outreach attempts will be made to offer the patient care coordination information and services.   Encounter Outcome:  No Answer  Julian Hy, [place] Direct Dial: [phone]

## 2021-09-28 DIAGNOSIS — E7800 Pure hypercholesterolemia, unspecified: Secondary | ICD-10-CM | POA: Diagnosis not present

## 2021-09-28 DIAGNOSIS — Z23 Encounter for immunization: Secondary | ICD-10-CM | POA: Diagnosis not present

## 2021-09-28 DIAGNOSIS — E559 Vitamin D deficiency, unspecified: Secondary | ICD-10-CM | POA: Diagnosis not present

## 2021-09-28 DIAGNOSIS — N500 Atrophy of testis: Secondary | ICD-10-CM | POA: Diagnosis not present

## 2021-09-28 DIAGNOSIS — Z125 Encounter for screening for malignant neoplasm of prostate: Secondary | ICD-10-CM | POA: Diagnosis not present

## 2021-09-28 DIAGNOSIS — I10 Essential (primary) hypertension: Secondary | ICD-10-CM | POA: Diagnosis not present

## 2021-09-28 DIAGNOSIS — Z0000 Encounter for general adult medical examination without abnormal findings: Secondary | ICD-10-CM | POA: Diagnosis not present

## 2021-09-28 DIAGNOSIS — Z952 Presence of prosthetic heart valve: Secondary | ICD-10-CM | POA: Diagnosis not present

## 2021-10-26 NOTE — Progress Notes (Unsigned)
Cardiology Office Note   Date:  10/28/2021   ID:  Myers, Reginald [birth year], MRN [id number]  PCP:  Josetta Huddle, MD    No chief complaint on file.  S/p AVR, MVR  Wt Readings from Last 3 Encounters:  10/28/21 205 lb (93 kg)  08/18/21 208 lb (94.3 kg)  04/27/21 213 lb (96.6 kg)       History of Present Illness: Reginald Myers is a 66 y.o. male   who had an AVR , mitral valve ring placed and Maze procedure in 2014.  Dr. Cyndia Bent.    In 2019, he was started on some HTN meds.  BP was 616 systolic at Dr. Inda Merlin office. He had not been eating well since 11/18 and he thinks the BP is the result.  He was started on Exforge 5/160 mg and felt somewhat lightheaded.  Ultimately, went off of these meds, but had high BP at a MOHS procedure in 2022.   Amlodipine and losartan were restarted.    BPs at home after rest is in the 073-710 range systolic.  BP is high at the MD's office.    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 Elevated mean gradient 35 mmHg suggesting significant  bioprosthetic valve stenosis. The aortic valve has been repaired/replaced.  Aortic valve regurgitation is mild to  moderate. Aortic regurgitation PHT measures 475 msec." TEE recommended.   BP at home is in the 110-120/60-70.  Continues as of 2023.  Saw Dr. Cyndia Bent and plan is for SAVR with tissue valve.    Denies : Chest pain. Dizziness. Leg edema. Nitroglycerin use. Orthopnea. Palpitations. Paroxysmal nocturnal dyspnea. Shortness of breath. Syncope.    Home readings are in the 120/70s range.     Past Medical History:  Diagnosis Date   Acute systolic heart failure ([place])    Aortic valve disorders    Atrial fibrillation ([facility])    Bell's palsy 09/2009   right-residual weakness with fatique    Dysrhythmia    Heart murmur    Hemorrhoids    Hyperlipidemia    Hypertension    Orthopnea    Right knee DJD    prior meniscal surgery    Shortness of breath    Testicular cancer ([place]) 1994    Past Surgical History:  Procedure Laterality Date   AORTIC VALVE REPLACEMENT N/A 09/07/2012   Procedure: AORTIC VALVE REPLACEMENT (AVR);  Surgeon: Gaye Pollack, MD;  Location: [facility];  Service: Open Heart Surgery;  Laterality: N/A;   CARDIAC CATHETERIZATION     CARDIOVERSION N/A 07/13/2012   Procedure: CARDIOVERSION;  Surgeon: [name], MD;  Location: [facility];  Service: Cardiovascular;  Laterality: N/A;   CARDIOVERSION N/A 08/08/2012   Procedure: CARDIOVERSION;  Surgeon: [name], MD;  Location: [place];  Service: Cardiovascular;  Laterality: N/A;   CLIPPING OF ATRIAL APPENDAGE N/A 09/07/2012   Procedure: CLIPPING OF ATRIAL APPENDAGE;  Surgeon: Gaye Pollack, MD;  Location: [facility];  Service: Open Heart Surgery;  Laterality: N/A;   INTRAOPERATIVE TRANSESOPHAGEAL ECHOCARDIOGRAM N/A 09/07/2012   Procedure: INTRAOPERATIVE TRANSESOPHAGEAL ECHOCARDIOGRAM;  Surgeon: Gaye Pollack, MD;  Location: [facility];  Service: Open Heart Surgery;  Laterality: N/A;   KNEE ARTHROSCOPY W/ MENISCAL REPAIR Right 1988   LEFT AND RIGHT HEART CATHETERIZATION WITH CORONARY ANGIOGRAM N/A 08/15/2012   Procedure: LEFT AND RIGHT HEART CATHETERIZATION WITH CORONARY ANGIOGRAM;  Surgeon: [name], MD;  Location: [facility] CATH  [facility];  Service: Cardiovascular;  Laterality: N/A;   MAZE N/A 09/07/2012   Procedure: MAZE;  Surgeon: Gaye Pollack, MD;  Location: [facility];  Service: Open Heart Surgery;  Laterality: N/A;   MITRAL VALVE REPAIR N/A 09/07/2012   Procedure: MITRAL VALVE REPAIR (MVR);  Surgeon: Gaye Pollack, MD;  Location: [place];  Service: Open Heart Surgery;  Laterality: N/A;   ORCHIECTOMY Left 1993   TEE WITHOUT CARDIOVERSION N/A 07/13/2012   Procedure: TRANSESOPHAGEAL ECHOCARDIOGRAM (TEE);  Surgeon: [name], MD;  Location: [facility];  Service: Cardiovascular;  Laterality: N/A;   TEE WITHOUT CARDIOVERSION N/A 02/03/2021    Procedure: TRANSESOPHAGEAL ECHOCARDIOGRAM (TEE);  Surgeon: Geralynn Rile, MD;  Location: [place];  Service: Cardiovascular;  Laterality: N/A;     Current Outpatient Medications  Medication Sig Dispense Refill   amLODipine (NORVASC) 5 MG tablet Take 5 mg by mouth daily.     aspirin EC 81 MG tablet Take 1 tablet (81 mg total) by mouth daily. Swallow whole. (Patient taking differently: Take 81 mg by mouth 2 (two) times a week. Swallow whole.) 90 tablet 3   atorvastatin (LIPITOR) 20 MG tablet TAKE 1 TABLET(20 MG) BY MOUTH DAILY 90 tablet 3   losartan (COZAAR) 50 MG tablet Take 50 mg by mouth daily.     sildenafil (VIAGRA) 100 MG tablet Take 1 tablet (100 mg total) by mouth daily as needed for erectile dysfunction. 10 tablet 2   No current facility-administered medications for this visit.    Allergies:   Amiodarone, Loperamide-simethicone, and Statins    Social History:  The patient  reports that he has never smoked. He has never used smokeless tobacco. He reports current alcohol use. He reports that he does not use drugs.   Family History:  The patient's family history includes Diabetes in his paternal grandmother; Heart disease in his paternal grandfather; Pulmonary fibrosis in his father.    ROS:  Please see the history of present illness.   Otherwise, review of systems are positive for eating healthy, regular exercise.   All other systems are reviewed and negative.    PHYSICAL EXAM: VS:  BP 122/68   Pulse 65   Ht '6\' 1"'$  (1.854 m)   Wt 205 lb (93 kg)   SpO2 99%   BMI 27.05 kg/m  , BMI Body mass index is 27.05 kg/m. GEN: Well nourished, well developed, in no acute distress HEENT: normal Neck: no JVD, carotid bruits, or masses Cardiac: RRR; 2/6 systolic and diastolic murmurs, no rubs, or gallops,no edema  Respiratory:  clear to auscultation bilaterally, normal work of breathing GI: soft, nontender, nondistended, + BS MS: no deformity or atrophy Skin: warm and dry, no  rash Neuro:  Strength and sensation are intact Psych: euthymic mood, full affect   EKG:   The ekg ordered 12/22 demonstrates sinus brady   Recent Labs: 01/22/2021: BUN 19; Creatinine, Ser 1.17; Hemoglobin 15.0; Platelets 167; Potassium 4.6; Sodium 139   Lipid Panel No results found for: "CHOL", "TRIG", "HDL", "CHOLHDL", "VLDL", "LDLCALC", "LDLDIRECT"   Other studies Reviewed: Additional studies/ records that were reviewed today with results demonstrating: labs reviewed.   ASSESSMENT AND PLAN:  S/p AVR/MVR: Moderate aortic regurgitation noted.  He has seen Dr. Cyndia Bent.  Plan for echocardiogram.  This was requested to happen every 6 months per Dr. Cyndia Bent.  Currently, no symptoms.  Need to ensure that LV function is staying normal. HTN: Elevated reading today.  Home readings are normal after he sits  for some time.  He typically gets readings in the 120s over 70s and below. S/p Maze: My exam, he is in normal sinus rhythm. Hyperlipidemia: Continue atorvastatin.  LDL 101 in August 2023. ED: Noted in the past but not discussed today.   Okay sounds good Current medicines are reviewed at length with the patient today.  The patient concerns regarding his medicines were addressed.  The following changes have been made:  No change  Labs/ tests ordered today include: echo  Orders Placed This Encounter  Procedures   ECHOCARDIOGRAM COMPLETE    Recommend 150 minutes/week of aerobic exercise Low fat, low carb, high fiber diet recommended  Disposition:   FU in 6 months   Signed, Larae Grooms, MD  10/28/2021 8:58 AM    [facility] [address], [place], [place]  [address] Phone: [phone]; Fax: [phone]

## 2021-10-28 ENCOUNTER — Ambulatory Visit: Payer: BC Managed Care – PPO | Attending: Interventional Cardiology | Admitting: Interventional Cardiology

## 2021-10-28 ENCOUNTER — Encounter: Payer: Self-pay | Admitting: Interventional Cardiology

## 2021-10-28 VITALS — BP 122/68 | HR 65 | Ht 73.0 in | Wt 205.0 lb

## 2021-10-28 DIAGNOSIS — Z9889 Other specified postprocedural states: Secondary | ICD-10-CM | POA: Diagnosis not present

## 2021-10-28 DIAGNOSIS — Z8679 Personal history of other diseases of the circulatory system: Secondary | ICD-10-CM

## 2021-10-28 DIAGNOSIS — Z952 Presence of prosthetic heart valve: Secondary | ICD-10-CM

## 2021-10-28 DIAGNOSIS — I10 Essential (primary) hypertension: Secondary | ICD-10-CM | POA: Diagnosis not present

## 2021-10-28 NOTE — Patient Instructions (Signed)
Medication Instructions:  Your physician recommends that you continue on your current medications as directed. Please refer to the Current Medication list given to you today.  *If you need a refill on your cardiac medications before your next appointment, please call your pharmacy*     Testing/Procedures: ECHO to be read by Dr. Audie Box Your physician has requested that you have an echocardiogram. Echocardiography is a painless test that uses sound waves to create images of your heart. It provides your doctor with information about the size and shape of your heart and how well your heart's chambers and valves are working. This procedure takes approximately one hour. There are no restrictions for this procedure.    Follow-Up: At [facility], you and your health needs are our priority.  As part of our continuing mission to provide you with exceptional heart care, we have created designated Provider Care Teams.  These Care Teams include your primary Cardiologist (physician) and Advanced Practice Providers (APPs -  Physician Assistants and Nurse Practitioners) who all work together to provide you with the care you need, when you need it.   Your next appointment:   6 month(s)  The format for your next appointment:   In Person  Provider:   Larae Grooms, MD

## 2021-10-29 ENCOUNTER — Telehealth: Payer: Self-pay

## 2021-10-29 NOTE — Patient Outreach (Signed)
  Care Coordination   Initial Visit Note   10/29/2021 Name: Sirr Kabel MRN: [id number] DOB: 01-Nov-1955  [name] is a 66 y.o. year old male who sees Leeroy Cha, MD for primary care. Call answered by patient's spouse Phylis Clifton Custard. Agreed to relay message to Mr. Edison Pace. Also provided mobile number if Mr. Edison Pace needs to be contacted next week.  What matters to the patients health and wellness today?  No Concerns Addressed    Goals Addressed   None     SDOH assessments and interventions completed:  No     Care Coordination Interventions Activated:  No  Care Coordination Interventions:  No, not indicated   Follow up plan:  Patient to call when available    Encounter Outcome:  Pt. Scheduled Will follow up within the next two weeks.    [facility] [phone]

## 2021-11-23 ENCOUNTER — Ambulatory Visit (HOSPITAL_COMMUNITY): Payer: BC Managed Care – PPO | Attending: Interventional Cardiology

## 2021-11-23 DIAGNOSIS — Z8679 Personal history of other diseases of the circulatory system: Secondary | ICD-10-CM | POA: Insufficient documentation

## 2021-11-23 DIAGNOSIS — Z9889 Other specified postprocedural states: Secondary | ICD-10-CM | POA: Diagnosis not present

## 2021-11-23 DIAGNOSIS — I10 Essential (primary) hypertension: Secondary | ICD-10-CM | POA: Diagnosis not present

## 2021-11-23 DIAGNOSIS — Z952 Presence of prosthetic heart valve: Secondary | ICD-10-CM | POA: Diagnosis not present

## 2021-11-23 LAB — ECHOCARDIOGRAM COMPLETE
AR max vel: 1.3 cm2
AV Area VTI: 1.54 cm2
AV Area mean vel: 1.41 cm2
AV Mean grad: 31 mmHg
AV Peak grad: 59.9 mmHg
Ao pk vel: 3.87 m/s
Area-P 1/2: 2.36 cm2
MV VTI: 3.11 cm2
P 1/2 time: 1149 msec
S' Lateral: 3.8 cm

## 2021-11-26 ENCOUNTER — Encounter: Payer: Self-pay | Admitting: Interventional Cardiology

## 2021-11-29 ENCOUNTER — Other Ambulatory Visit: Payer: Self-pay | Admitting: *Deleted

## 2021-11-29 DIAGNOSIS — I359 Nonrheumatic aortic valve disorder, unspecified: Secondary | ICD-10-CM

## 2021-12-09 ENCOUNTER — Encounter: Payer: Self-pay | Admitting: Surgery

## 2021-12-09 ENCOUNTER — Ambulatory Visit (INDEPENDENT_AMBULATORY_CARE_PROVIDER_SITE_OTHER): Payer: BC Managed Care – PPO | Admitting: Surgery

## 2021-12-09 VITALS — BP 152/91 | HR 73 | Resp 20 | Ht 73.0 in | Wt 211.0 lb

## 2021-12-09 DIAGNOSIS — I359 Nonrheumatic aortic valve disorder, unspecified: Secondary | ICD-10-CM

## 2021-12-09 DIAGNOSIS — Z952 Presence of prosthetic heart valve: Secondary | ICD-10-CM | POA: Diagnosis not present

## 2021-12-13 NOTE — Progress Notes (Addendum)
Cardiothoracic Surgery Consultation   PCP is Josetta Huddle, MD Referring Provider is [name], MD       Chief Complaint  Patient presents with   Aortic Insuffiency           HPI:   The patient is a 66 year old gentleman with a history of hypertension, hyperlipidemia, atrial fibrillation, who underwent aortic valve replacement by me in August 2014 using a 25 mm Magna-ease pericardial valve with mitral annuloplasty using a 30 mm ring and biatrial MAZE IV procedure with ligation of the left atrial appendage.  He has done well over the years and has been followed by Dr. [name].  A 2D echo in April 2022 was felt to show mild to moderate aortic insufficiency with a pressure half-time of 475 ms.  The mean gradient was 33 mmHg with a peak gradient of 69 mmHg.  Aortic valve area was measured at 0.6 cm.  Left ventricular ejection fraction was 60 to 65%.  He subsequently underwent a gated cardiac CTA on 07/09/2020 which showed restricted movement of the leaflets in systole with thickening and calcification.  The leaflet of the RCC was retracted with incomplete leaflet coaptation which was felt to be the source of central regurgitation.  There was no evidence of paravalvular leak.  The aortic root was measured at 4.3 cm which was unchanged from previously.  Coronary calcium score was 471 with mild disease in the LAD.  A TEE was done on 02/03/2021 which showed some thickening of the aortic valve leaflets.  There was mild restriction in systole.  There was no evidence of vegetation.  There was concern for diastolic flow reversal in the descending aorta.  The mean gradient across the aortic valve was 28 mmHg with what was felt to be moderate to severe aortic insufficiency related to valve deterioration.  The pressure half-time was measured at 619 ms.  Left ventricular ejection fraction and cavity size were normal.  I initially saw him on 08/18/2021 and he was feeling well and exercising regularly  without any symptoms.  His AI pressure half-time was 619 ms and left ventricular systolic function was normal with normal LV internal dimensions.  I felt that it was reasonable to continue following this since he was asymptomatic with no signs of cardiac function deterioration.  A follow-up echocardiogram on 11/23/2021 continues to show moderate to severe aortic insufficiency with pressure half-time recorded at 1149 ms.  The mean gradient across the prosthetic aortic valve was 31 mmHg with a peak gradient of 60 mmHg suggesting at least moderate prosthetic aortic stenosis.  Left ventricular ejection fraction remains 55 to 60% with an increase in the LV diastolic dimension of 5.5 cm.  There is trivial mitral valve regurgitation status post annuloplasty.  The mean gradient across the mitral valve is 5.7 mmHg.     He is here today with his wife and his daughter who is a Retail banker.  He denies any chest pain or pressure.  He has had no shortness of breath or exertional fatigue.  He denies orthopnea and PND.  He has had no peripheral edema.  He denies any dizziness or syncope.  He continues to exercise regularly. Current Outpatient Medications  Medication Sig Dispense Refill   amLODipine (NORVASC) 5 MG tablet Take 5 mg by mouth daily.     aspirin EC 81 MG tablet Take 1 tablet (81 mg total) by mouth daily. Swallow whole. (Patient taking differently: Take 81 mg by mouth  2 (two) times a week. Swallow whole.) 90 tablet 3   atorvastatin (LIPITOR) 20 MG tablet TAKE 1 TABLET(20 MG) BY MOUTH DAILY 90 tablet 3   losartan (COZAAR) 50 MG tablet Take 50 mg by mouth daily.     No current facility-administered medications for this visit.     Physical Exam: BP (!) 152/91 (BP Location: Left Arm, Patient Position: Sitting, Cuff Size: Normal)   Pulse 73   Resp 20   Ht '6\' 1"'$  (1.854 m)   Wt 211 lb (95.7 kg)   SpO2 98% Comment: RA  BMI 27.84 kg/m  He looks well. Cardiac exam shows a regular rate and  rhythm with a 2/6 systolic murmur along the right sternal border and a 2/6 diastolic murmur along the left lower sternal border. Lungs are clear. There is no peripheral edema.  Diagnostic Tests:  ECHOCARDIOGRAM REPORT       Patient Name:   CARLSON BELLAND Date of Exam: 11/23/2021  Medical Rec #:  [id number]        Height:       73.0 in  Accession #:    [id number]       Weight:       205.0 lb  Date of Birth:  [birth year]       BSA:          2.174 m  Patient Age:    89 years         BP:           167/79 mmHg  Patient Gender: M                HR:           53 bpm.  Exam Location:  [place]   Procedure: 2D Echo, Cardiac Doppler and Color Doppler   Indications:    Z95.5 S/p AVR, MVR    History:        Patient has prior history of Echocardiogram examinations,  most                 recent 05/20/2020. Signs/Symptoms:Murmur and Shortness of  Breath;                 Risk Factors:Hypertension and HLD. Maze procedure  09/07/2012.                 Aortic Valve: 25 mm pericardial valve is present in the  aortic                 position. Procedure Date: 09/07/2012.                  Mitral Valve: 30 mm prosthetic annuloplasty ring valve is                  present in the mitral position. Procedure Date:  09/07/2012.    Sonographer:   Marygrace Drought RCS  Referring Phys: [place]     1. Degenerative tissue bioprosthetic AVR Overall findings similar to TEE  done 05/20/20.   2. Left ventricular ejection fraction, by estimation, is 55 to 60%. The  left ventricle has normal function. The left ventricle has no regional  wall motion abnormalities. Left ventricular diastolic parameters are  indeterminate.   3. Right ventricular systolic function is normal. The right ventricular  size is normal. There is normal pulmonary artery systolic pressure.   4. Right atrial size was mildly dilated.   5. Post  repair with 30 mm Sorin ring trivial residual MR with mean   diastolic gradient 7 mmhg No MS . The mitral valve has been  repaired/replaced. Trivial mitral valve regurgitation. There is a 30 mm  prosthetic annuloplasty ring present in the mitral  position. Procedure Date: 09/07/2012.   6. Tricuspid valve regurgitation is mild to moderate.   7. 2014 post AVR with 25 mm pericardial Magna Ease valve Leaflets are  thickened and calcified moderate AS and moderate to severe central AR .  The aortic valve has been repaired/replaced. Aortic valve regurgitation is  moderate to severe. Moderate aortic  valve stenosis. There is a 25 mm pericardial valve present in the aortic  position. Procedure Date: 09/07/2012.   FINDINGS   Left Ventricle: Left ventricular ejection fraction, by estimation, is 55  to 60%. The left ventricle has normal function. The left ventricle has no  regional wall motion abnormalities. The left ventricular internal cavity  size was normal in size. There is   no left ventricular hypertrophy. Left ventricular diastolic parameters  are indeterminate.   Right Ventricle: The right ventricular size is normal. No increase in  right ventricular wall thickness. Right ventricular systolic function is  normal. There is normal pulmonary artery systolic pressure. The tricuspid  regurgitant velocity is 2.57 m/s, and   with an assumed right atrial pressure of 3 mmHg, the estimated right  ventricular systolic pressure is 14.4 mmHg.   Left Atrium: Left atrial size was normal in size.   Right Atrium: Right atrial size was mildly dilated.   Pericardium: There is no evidence of pericardial effusion.   Mitral Valve: Post repair with 30 mm Sorin ring trivial residual MR with  mean diastolic gradient 7 mmhg No MS. The mitral valve has been  repaired/replaced. Trivial mitral valve regurgitation. There is a 30 mm  prosthetic annuloplasty ring present in the  mitral position. Procedure Date: 09/07/2012. MV peak gradient, 16.1 mmHg.  The mean mitral  valve gradient is 5.7 mmHg.   Tricuspid Valve: The tricuspid valve is normal in structure. Tricuspid  valve regurgitation is mild to moderate.   Aortic Valve: 2014 post AVR with 25 mm pericardial Magna Ease valve  Leaflets are thickened and calcified moderate AS and moderate to severe  central AR. The aortic valve has been repaired/replaced. Aortic valve  regurgitation is moderate to severe. Aortic  regurgitation PHT measures 1149 msec. Moderate aortic stenosis is present.  Aortic valve mean gradient measures 31.0 mmHg. Aortic valve peak gradient  measures 59.9 mmHg. Aortic valve area, by VTI measures 1.54 cm. There is  a 25 mm pericardial valve  present in the aortic position. Procedure Date: 09/07/2012.   Pulmonic Valve: The pulmonic valve was normal in structure. Pulmonic valve  regurgitation is mild.   Aorta: The aortic root is normal in size and structure.   IAS/Shunts: No atrial level shunt detected by color flow Doppler.   Additional Comments: Degenerative tissue bioprosthetic AVR Overall  findings similar to TEE done 05/20/20.     LEFT VENTRICLE  PLAX 2D  LVIDd:         5.50 cm   Diastology  LVIDs:         3.80 cm   LV e' medial:    8.27 cm/s  LV PW:         1.20 cm   LV E/e' medial:  20.1  LV IVS:        1.00 cm   LV e' lateral:  15.10 cm/s  LVOT diam:     2.30 cm   LV E/e' lateral: 11.0  LV SV:         139  LV SV Index:   64  LVOT Area:     4.15 cm     RIGHT VENTRICLE  RV Basal diam:  4.30 cm  RV Mid diam:    2.85 cm  RV S prime:     14.10 cm/s  TAPSE (M-mode): 2.2 cm  RVSP:           29.4 mmHg   LEFT ATRIUM              Index        RIGHT ATRIUM           Index  LA diam:        3.40 cm  1.56 cm/m   RA Pressure: 3.00 mmHg  LA Vol (A2C):   103.0 ml 47.37 ml/m  RA Area:     28.20 cm  LA Vol (A4C):   61.3 ml  28.19 ml/m  RA Volume:   109.00 ml 50.13 ml/m  LA Biplane Vol: 81.0 ml  37.25 ml/m   AORTIC VALVE  AV Area (Vmax):    1.30 cm  AV Area  (Vmean):   1.41 cm  AV Area (VTI):     1.54 cm  AV Vmax:           387.00 cm/s  AV Vmean:          264.000 cm/s  AV VTI:            0.900 m  AV Peak Grad:      59.9 mmHg  AV Mean Grad:      31.0 mmHg  LVOT Vmax:         121.50 cm/s  LVOT Vmean:        89.500 cm/s  LVOT VTI:          0.334 m  LVOT/AV VTI ratio: 0.37  AI PHT:            1149 msec    AORTA  Ao Root diam: 4.10 cm  Ao Asc diam:  3.40 cm   MITRAL VALVE                TRICUSPID VALVE  MV Area (PHT): 2.36 cm     TR Peak grad:   26.4 mmHg  MV Area VTI:   3.11 cm     TR Vmax:        257.00 cm/s  MV Peak grad:  16.1 mmHg    Estimated RAP:  3.00 mmHg  MV Mean grad:  5.7 mmHg     RVSP:           29.4 mmHg  MV Vmax:       2.00 m/s  MV Vmean:      104.1 cm/s   SHUNTS  MV Decel Time: 322 msec     Systemic VTI:  0.33 m  MV E velocity: 166.00 cm/s  Systemic Diam: 2.30 cm   Jenkins Rouge MD  Electronically signed by Jenkins Rouge MD  Signature Date/Time: 11/23/2021/10:19:19 AM        Final       Impression:  This gentleman has stage C, asymptomatic, 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  He also had mitral valve annuloplasty and Maze procedure  at that time.  His most recent echocardiogram shows a further increase in the left ventricular diastolic dimension to 5.5 cm.  He has trivial MR and has been maintaining sinus rhythm.  I think it would be best to proceed with redo aortic valve replacement to prevent further left ventricular dilatation and dysfunction.  I will plan to use an INSPIRIS RESILIA pericardial valve which should have a longer life span for him.  I discussed the use of a mechanical valve but I think the risk of anticoagulation and emboli is greater than the benefit in his age group.  I reviewed the echocardiogram images with the patient and his family and answered their questions.  He is inclined to  proceed with surgery.  I will discuss assessing his coronary anatomy further with Dr. [name].  His gated coronary CTA in June 2022 showed mild disease in the LAD and minimal disease in the RCA and left circumflex.  Plan:  He will decide about the timing of surgery and will call us to schedule.  I will discuss further coronary assessment with Dr. [name].  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  Gaye Pollack, MD [facility] [phone]

## 2021-12-14 ENCOUNTER — Other Ambulatory Visit: Payer: Self-pay | Admitting: *Deleted

## 2021-12-14 DIAGNOSIS — I351 Nonrheumatic aortic (valve) insufficiency: Secondary | ICD-10-CM

## 2022-01-10 DIAGNOSIS — D2272 Melanocytic nevi of left lower limb, including hip: Secondary | ICD-10-CM | POA: Diagnosis not present

## 2022-01-10 DIAGNOSIS — L570 Actinic keratosis: Secondary | ICD-10-CM | POA: Diagnosis not present

## 2022-01-10 DIAGNOSIS — D2261 Melanocytic nevi of right upper limb, including shoulder: Secondary | ICD-10-CM | POA: Diagnosis not present

## 2022-01-10 DIAGNOSIS — D1801 Hemangioma of skin and subcutaneous tissue: Secondary | ICD-10-CM | POA: Diagnosis not present

## 2022-01-10 DIAGNOSIS — Z85828 Personal history of other malignant neoplasm of skin: Secondary | ICD-10-CM | POA: Diagnosis not present

## 2022-01-10 DIAGNOSIS — D2262 Melanocytic nevi of left upper limb, including shoulder: Secondary | ICD-10-CM | POA: Diagnosis not present

## 2022-01-10 NOTE — Pre-Procedure Instructions (Signed)
Surgical Instructions    Your procedure is scheduled on Thursday, December 7th.  Report to [facility] Main Entrance "A" at 5:30 A.M., then check in with the Admitting office.  Call this number if you have problems the morning of surgery:  [phone]   If you have any questions prior to your surgery date call [phone]: Open Monday-Friday 8am-4pm If you experience any cold or flu symptoms such as cough, fever, chills, shortness of breath, etc. between now and your scheduled surgery, please notify us at the above number     Remember:  Do not eat or drink after midnight the night before your surgery    Take these medicines the morning of surgery with A SIP OF WATER:  amLODipine (NORVASC)  atorvastatin (LIPITOR)   As of today, STOP taking any Aspirin (unless otherwise instructed by your surgeon) Aleve, Naproxen, Ibuprofen, Motrin, Advil, Goody's, BC's, all herbal medications, fish oil, and all vitamins.           Do not wear jewelry. Do not wear lotions, powders, cologne or deodorant. Men may shave face and neck. Do not bring valuables to the hospital.  [facility] is not responsible for any belongings or valuables.    Do NOT Smoke (Tobacco/Vaping)  24 hours prior to your procedure  If you use a CPAP at night, you may bring your mask for your overnight stay.   Contacts, glasses, hearing aids, dentures or partials may not be worn into surgery, please bring cases for these belongings   For patients admitted to the hospital, discharge time will be determined by your treatment team.   Patients discharged the day of surgery will not be allowed to drive home, and someone needs to stay with them for 24 hours.   SURGICAL WAITING ROOM VISITATION Patients having surgery or a procedure may have no more than 2 support people in the waiting area - these visitors may rotate.   Children under the age of 68 must have an adult with them who is not the patient. If the patient needs to stay  at the hospital during part of their recovery, the visitor guidelines for inpatient rooms apply. Pre-op nurse will coordinate an appropriate time for 1 support person to accompany patient in pre-op.  This support person may not rotate.   Please refer to [email or web address] for the visitor guidelines for Inpatients (after your surgery is over and you are in a regular room).    Special instructions:    Oral Hygiene is also important to reduce your risk of infection.  Remember - BRUSH YOUR TEETH THE MORNING OF SURGERY WITH YOUR REGULAR TOOTHPASTE   [facility]- Preparing For Surgery  Before surgery, you can play an important role. Because skin is not sterile, your skin needs to be as free of germs as possible. You can reduce the number of germs on your skin by washing with CHG (chlorahexidine gluconate) Soap before surgery.  CHG is an antiseptic cleaner which kills germs and bonds with the skin to continue killing germs even after washing.     Please do not use if you have an allergy to CHG or antibacterial soaps. If your skin becomes reddened/irritated stop using the CHG.  Do not shave (including legs and underarms) for at least 48 hours prior to first CHG shower. It is OK to shave your face.  Please follow these instructions carefully.     Shower the NIGHT BEFORE SURGERY and the MORNING OF SURGERY with CHG Soap.  If you chose to wash your hair, wash your hair first as usual with your normal shampoo. After you shampoo, rinse your hair and body thoroughly to remove the shampoo.  Then [employer] and genitals (private parts) with your normal soap and rinse thoroughly to remove soap.  After that Use CHG Soap as you would any other liquid soap. You can apply CHG directly to the skin and wash gently with a scrungie or a clean washcloth.   Apply the CHG Soap to your body ONLY FROM THE NECK DOWN.  Do not use on open wounds or open sores. Avoid  contact with your eyes, ears, mouth and genitals (private parts). Wash Face and genitals (private parts)  with your normal soap.   Wash thoroughly, paying special attention to the area where your surgery will be performed.  Thoroughly rinse your body with warm water from the neck down.  DO NOT shower/wash with your normal soap after using and rinsing off the CHG Soap.  Pat yourself dry with a CLEAN TOWEL.  Wear CLEAN PAJAMAS to bed the night before surgery  Place CLEAN SHEETS on your bed the night before your surgery  DO NOT SLEEP WITH PETS.   Day of Surgery:  Take a shower with CHG soap. Wear Clean/Comfortable clothing the morning of surgery Do not apply any deodorants/lotions.   Remember to brush your teeth WITH YOUR REGULAR TOOTHPASTE.    If you received a COVID test during your pre-op visit, it is requested that you wear a mask when out in public, stay away from anyone that may not be feeling well, and notify your surgeon if you develop symptoms. If you have been in contact with anyone that has tested positive in the last 10 days, please notify your surgeon.    Please read over the following fact sheets that you were given.

## 2022-01-11 ENCOUNTER — Encounter (HOSPITAL_COMMUNITY): Payer: Self-pay

## 2022-01-11 ENCOUNTER — Ambulatory Visit (HOSPITAL_COMMUNITY)
Admission: RE | Admit: 2022-01-11 | Discharge: 2022-01-11 | Disposition: A | Discharge status: Home / Self Care | Payer: BC Managed Care – PPO | Source: Ambulatory Visit | Attending: Surgery | Admitting: Surgery

## 2022-01-11 ENCOUNTER — Encounter (HOSPITAL_COMMUNITY)
Admission: RE | Admit: 2022-01-11 | Discharge: 2022-01-11 | Disposition: A | Discharge status: Home / Self Care | Payer: BC Managed Care – PPO | Source: Ambulatory Visit | Attending: Surgery | Admitting: Surgery

## 2022-01-11 ENCOUNTER — Ambulatory Visit (HOSPITAL_BASED_OUTPATIENT_CLINIC_OR_DEPARTMENT_OTHER)
Admission: RE | Admit: 2022-01-11 | Discharge: 2022-01-11 | Disposition: A | Discharge status: Home / Self Care | Payer: BC Managed Care – PPO | Source: Ambulatory Visit | Attending: Surgery | Admitting: Surgery

## 2022-01-11 ENCOUNTER — Other Ambulatory Visit: Payer: Self-pay

## 2022-01-11 VITALS — BP 141/62 | HR 62 | Temp 97.5°F | Resp 18 | Ht 73.0 in | Wt 213.6 lb

## 2022-01-11 DIAGNOSIS — I5022 Chronic systolic (congestive) heart failure: Secondary | ICD-10-CM | POA: Diagnosis not present

## 2022-01-11 DIAGNOSIS — M1711 Unilateral primary osteoarthritis, right knee: Secondary | ICD-10-CM | POA: Diagnosis not present

## 2022-01-11 DIAGNOSIS — J939 Pneumothorax, unspecified: Secondary | ICD-10-CM | POA: Diagnosis not present

## 2022-01-11 DIAGNOSIS — T82857A Stenosis of cardiac prosthetic devices, implants and grafts, initial encounter: Secondary | ICD-10-CM | POA: Diagnosis not present

## 2022-01-11 DIAGNOSIS — Z1152 Encounter for screening for COVID-19: Secondary | ICD-10-CM | POA: Diagnosis not present

## 2022-01-11 DIAGNOSIS — Z8547 Personal history of malignant neoplasm of testis: Secondary | ICD-10-CM | POA: Diagnosis not present

## 2022-01-11 DIAGNOSIS — I351 Nonrheumatic aortic (valve) insufficiency: Secondary | ICD-10-CM

## 2022-01-11 DIAGNOSIS — I110 Hypertensive heart disease with heart failure: Secondary | ICD-10-CM | POA: Diagnosis not present

## 2022-01-11 DIAGNOSIS — M1990 Unspecified osteoarthritis, unspecified site: Secondary | ICD-10-CM | POA: Diagnosis not present

## 2022-01-11 DIAGNOSIS — D62 Acute posthemorrhagic anemia: Secondary | ICD-10-CM | POA: Diagnosis not present

## 2022-01-11 DIAGNOSIS — J90 Pleural effusion, not elsewhere classified: Secondary | ICD-10-CM | POA: Diagnosis not present

## 2022-01-11 DIAGNOSIS — I350 Nonrheumatic aortic (valve) stenosis: Secondary | ICD-10-CM | POA: Diagnosis not present

## 2022-01-11 DIAGNOSIS — Z79899 Other long term (current) drug therapy: Secondary | ICD-10-CM | POA: Diagnosis not present

## 2022-01-11 DIAGNOSIS — Z888 Allergy status to other drugs, medicaments and biological substances status: Secondary | ICD-10-CM | POA: Diagnosis not present

## 2022-01-11 DIAGNOSIS — Z7982 Long term (current) use of aspirin: Secondary | ICD-10-CM | POA: Diagnosis not present

## 2022-01-11 DIAGNOSIS — Z833 Family history of diabetes mellitus: Secondary | ICD-10-CM | POA: Diagnosis not present

## 2022-01-11 DIAGNOSIS — Z836 Family history of other diseases of the respiratory system: Secondary | ICD-10-CM | POA: Diagnosis not present

## 2022-01-11 DIAGNOSIS — I083 Combined rheumatic disorders of mitral, aortic and tricuspid valves: Secondary | ICD-10-CM | POA: Diagnosis not present

## 2022-01-11 DIAGNOSIS — Y831 Surgical operation with implant of artificial internal device as the cause of abnormal reaction of the patient, or of later complication, without mention of misadventure at the time of the procedure: Secondary | ICD-10-CM | POA: Diagnosis present

## 2022-01-11 DIAGNOSIS — I4891 Unspecified atrial fibrillation: Secondary | ICD-10-CM | POA: Diagnosis not present

## 2022-01-11 DIAGNOSIS — Z8249 Family history of ischemic heart disease and other diseases of the circulatory system: Secondary | ICD-10-CM | POA: Diagnosis not present

## 2022-01-11 DIAGNOSIS — Z952 Presence of prosthetic heart valve: Secondary | ICD-10-CM | POA: Diagnosis not present

## 2022-01-11 DIAGNOSIS — I10 Essential (primary) hypertension: Secondary | ICD-10-CM | POA: Insufficient documentation

## 2022-01-11 DIAGNOSIS — E785 Hyperlipidemia, unspecified: Secondary | ICD-10-CM | POA: Diagnosis not present

## 2022-01-11 DIAGNOSIS — J9811 Atelectasis: Secondary | ICD-10-CM | POA: Diagnosis not present

## 2022-01-11 DIAGNOSIS — Z01818 Encounter for other preprocedural examination: Secondary | ICD-10-CM

## 2022-01-11 DIAGNOSIS — D6959 Other secondary thrombocytopenia: Secondary | ICD-10-CM | POA: Diagnosis not present

## 2022-01-11 LAB — COMPREHENSIVE METABOLIC PANEL
ALT: 14 U/L (ref 0–44)
AST: 22 U/L (ref 15–41)
Albumin: 3.7 g/dL (ref 3.5–5.0)
Alkaline Phosphatase: 63 U/L (ref 38–126)
Anion gap: 8 (ref 5–15)
BUN: 19 mg/dL (ref 8–23)
CO2: 20 mmol/L — ABNORMAL LOW (ref 22–32)
Calcium: 9.1 mg/dL (ref 8.9–10.3)
Chloride: 110 mmol/L (ref 98–111)
Creatinine, Ser: 1.14 mg/dL (ref 0.61–1.24)
GFR, Estimated: >60 mL/min (ref >60)
Glucose, Bld: 97 mg/dL (ref 70–99)
Potassium: 4.1 mmol/L (ref 3.5–5.1)
Sodium: 138 mmol/L (ref 135–145)
Total Bilirubin: 2 mg/dL — ABNORMAL HIGH (ref 0.3–1.2)
Total Protein: 6.6 g/dL (ref 6.5–8.1)

## 2022-01-11 LAB — URINALYSIS, ROUTINE W REFLEX MICROSCOPIC
Bilirubin Urine: NEGATIVE
Glucose, UA: NEGATIVE mg/dL
Hgb urine dipstick: NEGATIVE
Ketones, ur: NEGATIVE mg/dL
Leukocytes,Ua: NEGATIVE
Nitrite: NEGATIVE
Protein, ur: NEGATIVE mg/dL
Specific Gravity, Urine: 1.002 — ABNORMAL LOW (ref 1.005–1.030)
pH: 6 (ref 5.0–8.0)

## 2022-01-11 LAB — PROTIME-INR
INR: 1.1 (ref 0.8–1.2)
Prothrombin Time: 14.5 seconds (ref 11.4–15.2)

## 2022-01-11 LAB — BLOOD GAS, ARTERIAL
Acid-Base Excess: 1.7 mmol/L (ref 0.0–2.0)
Bicarbonate: 25.8 mmol/L (ref 20.0–28.0)
Drawn by: 6647
O2 Saturation: 98.1 %
Patient temperature: 37
pCO2 arterial: 38 mmHg (ref 32–48)
pH, Arterial: 7.44 (ref 7.35–7.45)
pO2, Arterial: 109 mmHg — ABNORMAL HIGH (ref 83–108)

## 2022-01-11 LAB — CBC
HCT: 41.6 % (ref 39.0–52.0)
Hemoglobin: 14.4 g/dL (ref 13.0–17.0)
MCH: 30.3 pg (ref 26.0–34.0)
MCHC: 34.6 g/dL (ref 30.0–36.0)
MCV: 87.6 fL (ref 80.0–100.0)
Platelets: 154 10*3/uL (ref 150–400)
RBC: 4.75 MIL/uL (ref 4.22–5.81)
RDW: 13.2 % (ref 11.5–15.5)
WBC: 6.5 10*3/uL (ref 4.0–10.5)
nRBC: 0 % (ref 0.0–0.2)

## 2022-01-11 LAB — SURGICAL PCR SCREEN
MRSA, PCR: NEGATIVE
Staphylococcus aureus: NEGATIVE

## 2022-01-11 LAB — APTT: aPTT: 29 seconds (ref 24–36)

## 2022-01-11 NOTE — Progress Notes (Signed)
PCP - Noah Delaine Cardiologist - Larae Grooms MD  PPM/ICD - denies Device Orders -  Rep Notified -   Chest x-ray - 01/11/22 EKG - 01/11/22 Stress Test -  ECHO - 11/23/21 Cardiac Cath - 08/15/2012  Sleep Study - none CPAP -   Fasting Blood Sugar - na Checks Blood Sugar _____ times a day  Last dose of GLP1 agonist-  na GLP1 instructions:   Blood Thinner Instructions:na Aspirin Instructions:na  ERAS Protcol -no PRE-SURGERY Ensure or G2-   COVID TEST- yes -01/11/22   Anesthesia review: yes -cardiac history  Patient denies shortness of breath, fever, cough and chest pain at PAT appointment   All instructions explained to the patient, with a verbal understanding of the material. Patient agrees to go over the instructions while at home for a better understanding. Patient also instructed to wear a mask when out in public after being tested for COVID-19. The opportunity to ask questions was provided.

## 2022-01-12 LAB — SARS CORONAVIRUS 2 (TAT 6-24 HRS): SARS Coronavirus 2: NEGATIVE

## 2022-01-12 LAB — HEMOGLOBIN A1C
Hgb A1c MFr Bld: 5.1 % (ref 4.8–5.6)
Mean Plasma Glucose: 100 mg/dL

## 2022-01-12 MED ORDER — TRANEXAMIC ACID (OHS) PUMP PRIME SOLUTION
2.0000 mg/kg | INTRAVENOUS | Status: DC
  Filled 2022-01-12: qty 1.94

## 2022-01-12 MED ORDER — MANNITOL 20 % IV SOLN
INTRAVENOUS | Status: DC
  Filled 2022-01-12: qty 13

## 2022-01-12 MED ORDER — CEFAZOLIN SODIUM-DEXTROSE 2-4 GM/100ML-% IV SOLN
2.0000 g | INTRAVENOUS | Status: AC
  Administered 2022-01-13: 2 g via INTRAVENOUS
  Filled 2022-01-12: qty 100

## 2022-01-12 MED ORDER — PLASMA-LYTE A IV SOLN
INTRAVENOUS | Status: DC
  Filled 2022-01-12: qty 2.5

## 2022-01-12 MED ORDER — HEPARIN 30,000 UNITS/1000 ML (OHS) CELLSAVER SOLUTION
Status: DC
  Filled 2022-01-12: qty 1000

## 2022-01-12 MED ORDER — TRANEXAMIC ACID (OHS) BOLUS VIA INFUSION
15.0000 mg/kg | INTRAVENOUS | Status: AC
  Administered 2022-01-13: 1453.5 mg via INTRAVENOUS
  Filled 2022-01-12: qty 1454

## 2022-01-12 MED ORDER — EPINEPHRINE HCL 5 MG/250ML IV SOLN IN NS
0.0000 ug/min | INTRAVENOUS | Status: DC
  Filled 2022-01-12: qty 250

## 2022-01-12 MED ORDER — POTASSIUM CHLORIDE 2 MEQ/ML IV SOLN
80.0000 meq | INTRAVENOUS | Status: DC
  Filled 2022-01-12: qty 40

## 2022-01-12 MED ORDER — MILRINONE LACTATE IN DEXTROSE 20-5 MG/100ML-% IV SOLN
0.3000 ug/kg/min | INTRAVENOUS | Status: DC
  Filled 2022-01-12: qty 100

## 2022-01-12 MED ORDER — VANCOMYCIN HCL 1500 MG/300ML IV SOLN
1500.0000 mg | INTRAVENOUS | Status: AC
  Administered 2022-01-13: 1500 mg via INTRAVENOUS
  Filled 2022-01-12: qty 300

## 2022-01-12 MED ORDER — DEXMEDETOMIDINE HCL IN NACL 400 MCG/100ML IV SOLN
0.1000 ug/kg/h | INTRAVENOUS | Status: AC
  Administered 2022-01-13: .7 ug/kg/h via INTRAVENOUS
  Filled 2022-01-12: qty 100

## 2022-01-12 MED ORDER — PHENYLEPHRINE HCL-NACL 20-0.9 MG/250ML-% IV SOLN
30.0000 ug/min | INTRAVENOUS | Status: AC
  Administered 2022-01-13: 40 ug/min via INTRAVENOUS
  Filled 2022-01-12: qty 250

## 2022-01-12 MED ORDER — INSULIN REGULAR(HUMAN) IN NACL 100-0.9 UT/100ML-% IV SOLN
INTRAVENOUS | Status: AC
  Administered 2022-01-13: .7 [IU]/h via INTRAVENOUS
  Filled 2022-01-12: qty 100

## 2022-01-12 MED ORDER — TRANEXAMIC ACID 1000 MG/10ML IV SOLN
1.5000 mg/kg/h | INTRAVENOUS | Status: AC
  Administered 2022-01-13: 1.5 mg/kg/h via INTRAVENOUS
  Filled 2022-01-12: qty 25

## 2022-01-12 MED ORDER — NOREPINEPHRINE 4 MG/250ML-% IV SOLN
0.0000 ug/min | INTRAVENOUS | Status: DC
  Filled 2022-01-12: qty 250

## 2022-01-12 NOTE — H&P (Signed)
[name]       [name] [address]             [phone]      Cardiothoracic Surgery Admission History and Physical   PCP is Josetta Huddle, MD Referring Provider is Reginald Booze, MD       Chief Complaint  Patient presents with   Prosthetic aortic valve Insuffiency           HPI:     The patient is a 66 year old gentleman with a history of hypertension, hyperlipidemia, atrial fibrillation, who underwent aortic valve replacement by me in August 2014 using a 25 mm Magna-ease pericardial valve with mitral annuloplasty using a 30 mm ring and biatrial MAZE IV procedure with ligation of the left atrial appendage.  He has done well over the years and has been followed by Dr. Irish Myers.  A 2D echo in April 2022 was felt to show mild to moderate aortic insufficiency with a pressure half-time of 475 ms.  The mean gradient was 33 mmHg with a peak gradient of 69 mmHg.  Aortic valve area was measured at 0.6 cm.  Left ventricular ejection fraction was 60 to 65%.  He subsequently underwent a gated cardiac CTA on 07/09/2020 which showed restricted movement of the leaflets in systole with thickening and calcification.  The leaflet of the RCC was retracted with incomplete leaflet coaptation which was felt to be the source of central regurgitation.  There was no evidence of paravalvular leak.  The aortic root was measured at 4.3 cm which was unchanged from previously.  Coronary calcium score was 471 with mild disease in the LAD.  A TEE was done on 02/03/2021 which showed some thickening of the aortic valve leaflets.  There was mild restriction in systole.  There was no evidence of vegetation.  There was concern for diastolic flow reversal in the descending aorta.  The mean gradient across the aortic valve was 28 mmHg with what was felt to be moderate to severe aortic insufficiency related to valve deterioration.  The pressure half-time was measured at 619 ms.  Left ventricular  ejection fraction and cavity size were normal.  I initially saw him on 08/18/2021 and he was feeling well and exercising regularly without any symptoms.  His AI pressure half-time was 619 ms and left ventricular systolic function was normal with normal LV internal dimensions.  I felt that it was reasonable to continue following this since he was asymptomatic with no signs of cardiac function deterioration.  A follow-up echocardiogram on 11/23/2021 continues to show moderate to severe aortic insufficiency with pressure half-time recorded at 1149 ms.  The mean gradient across the prosthetic aortic valve was 31 mmHg with a peak gradient of 60 mmHg suggesting at least moderate prosthetic aortic stenosis.  Left ventricular ejection fraction remains 55 to 60% with an increase in the LV diastolic dimension of 5.5 cm.  There is trivial mitral valve regurgitation status post annuloplasty.  The mean gradient across the mitral valve is 5.7 mmHg.      He denies any chest pain or pressure.  He has had no shortness of breath or exertional fatigue.  He denies orthopnea and PND.  He has had no peripheral edema.  He denies any dizziness or syncope.  He continues to exercise regularly.     Past Medical History:  Diagnosis Date   Acute systolic heart failure (HCC)     Aortic valve disorders  Atrial fibrillation (Reginald Myers)     Bell's palsy 09/2009    right-residual weakness with fatique    Dysrhythmia     Heart murmur     Hemorrhoids     Hyperlipidemia     Hypertension     Orthopnea     Right knee DJD      prior meniscal surgery   Shortness of breath     Testicular cancer (Reginald Myers) 1994           Past Surgical History:  Procedure Laterality Date   AORTIC VALVE REPLACEMENT N/A 09/07/2012    Procedure: AORTIC VALVE REPLACEMENT (AVR);  Surgeon: Reginald Pollack, MD;  Location: [facility];  Service: Open Heart Surgery;  Laterality: N/A;   CARDIAC CATHETERIZATION       CARDIOVERSION N/A 07/13/2012    Procedure: CARDIOVERSION;   Surgeon: Reginald Booze, MD;  Location: [facility];  Service: Cardiovascular;  Laterality: N/A;   CARDIOVERSION N/A 08/08/2012    Procedure: CARDIOVERSION;  Surgeon: Reginald Booze, MD;  Location: [facility];  Service: Cardiovascular;  Laterality: N/A;   CLIPPING OF ATRIAL APPENDAGE N/A 09/07/2012    Procedure: CLIPPING OF ATRIAL APPENDAGE;  Surgeon: Reginald Pollack, MD;  Location: [facility];  Service: Open Heart Surgery;  Laterality: N/A;   INTRAOPERATIVE TRANSESOPHAGEAL ECHOCARDIOGRAM N/A 09/07/2012    Procedure: INTRAOPERATIVE TRANSESOPHAGEAL ECHOCARDIOGRAM;  Surgeon: Reginald Pollack, MD;  Location: [facility];  Service: Open Heart Surgery;  Laterality: N/A;   KNEE ARTHROSCOPY W/ MENISCAL REPAIR Right 1988   LEFT AND RIGHT HEART CATHETERIZATION WITH CORONARY ANGIOGRAM N/A 08/15/2012    Procedure: LEFT AND RIGHT HEART CATHETERIZATION WITH CORONARY ANGIOGRAM;  Surgeon: Reginald Booze, MD;  Location: [facility] CATH LAB;  Service: Cardiovascular;  Laterality: N/A;   MAZE N/A 09/07/2012    Procedure: MAZE;  Surgeon: Reginald Pollack, MD;  Location: [facility];  Service: Open Heart Surgery;  Laterality: N/A;   MITRAL VALVE REPAIR N/A 09/07/2012    Procedure: MITRAL VALVE REPAIR (MVR);  Surgeon: Reginald Pollack, MD;  Location: [place];  Service: Open Heart Surgery;  Laterality: N/A;   ORCHIECTOMY Left 1993   TEE WITHOUT CARDIOVERSION N/A 07/13/2012    Procedure: TRANSESOPHAGEAL ECHOCARDIOGRAM (TEE);  Surgeon: Reginald Booze, MD;  Location: [facility];  Service: Cardiovascular;  Laterality: N/A;   TEE WITHOUT CARDIOVERSION N/A 02/03/2021    Procedure: TRANSESOPHAGEAL ECHOCARDIOGRAM (TEE);  Surgeon: Reginald Rile, MD;  Location: [facility];  Service: Cardiovascular;  Laterality: N/A;           Family History  Problem Relation Age of Onset   Pulmonary fibrosis Father     Diabetes Paternal Grandmother     Heart disease Paternal Grandfather     Heart attack Neg Hx        Social History Social  History         Tobacco Use   Smoking status: Never   Smokeless tobacco: Never  Vaping Use   Vaping Use: Never used  Substance Use Topics   Alcohol use: Yes      Comment: 77/2014 "glass of wine couple times a month"   Drug use: No            Current Outpatient Medications  Medication Sig Dispense Refill   amLODipine (NORVASC) 5 MG tablet Take 5 mg by mouth daily.       aspirin EC 81 MG tablet Take 1 tablet (81 mg total) by mouth daily. Swallow whole. (Patient taking differently:  Take 81 mg by mouth 2 (two) times a week. Swallow whole.) 90 tablet 3   atorvastatin (LIPITOR) 20 MG tablet TAKE 1 TABLET(20 MG) BY MOUTH DAILY 90 tablet 3   losartan (COZAAR) 50 MG tablet Take 50 mg by mouth daily.       sildenafil (VIAGRA) 100 MG tablet Take 1 tablet (100 mg total) by mouth daily as needed for erectile dysfunction. 10 tablet 2    No current facility-administered medications for this visit.           Allergies  Allergen Reactions   Amiodarone Other (See Comments)      Intolerance due to nausea Other reaction(s): upset stomach   Statins Other (See Comments)      Muscle aches  - currently taking a Statin drug Other reaction(s): not tolerated      Review of Systems  Constitutional:  Negative for activity change, chills, fatigue and fever.  HENT: Negative.    Eyes: Negative.   Respiratory:  Negative for chest tightness and shortness of breath.   Cardiovascular:  Negative for chest pain, palpitations and leg swelling.  Gastrointestinal: Negative.   Endocrine: Negative.   Genitourinary: Negative.   Musculoskeletal: Negative.   Skin: Negative.   Allergic/Immunologic: Negative.   Neurological:  Negative for dizziness and syncope.  Hematological: Negative.   Psychiatric/Behavioral: Negative.        BP (!) 168/81 (BP Location: Left Arm, Patient Position: Sitting)   Pulse (!) 57   Resp 20   Ht '6\' 1"'$  (1.854 m)   Wt 208 lb (94.3 kg)   SpO2 100% Comment: RA  BMI 27.44 kg/m   Physical Exam Constitutional:      Appearance: Normal appearance. He is normal weight.  HENT:     Head: Normocephalic and atraumatic.  Eyes:     Extraocular Movements: Extraocular movements intact.     Conjunctiva/sclera: Conjunctivae normal.     Pupils: Pupils are equal, round, and reactive to light.  Neck:     Vascular: No carotid bruit.  Cardiovascular:     Rate and Rhythm: Normal rate and regular rhythm.     Heart sounds: Murmur heard.     Comments: 2/6 systolic and diastolic murmur RSB Pulmonary:     Effort: Pulmonary effort is normal.     Breath sounds: Normal breath sounds.  Abdominal:     General: There is no distension.     Tenderness: There is no abdominal tenderness.  Musculoskeletal:        General: No swelling. Normal range of motion.     Cervical back: Normal range of motion and neck supple.  Skin:    General: Skin is warm and dry.  Neurological:     General: No focal deficit present.     Mental Status: He is alert and oriented to person, place, and time.  Psychiatric:        Mood and Affect: Mood normal.        Behavior: Behavior normal.          Diagnostic Tests:   TRANSESOPHOGEAL ECHO REPORT         Patient Name:   Reginald Myers Date of Exam: 02/03/2021  Medical Rec #:  [id number]        Height:       73.0 in  Accession #:    [id number]       Weight:       207.0 lb  Date of Birth:  [birth year]  BSA:          2.183 m  Patient Age:    39 years         BP:           123/60 mmHg  Patient Gender: M                HR:           75 bpm.  Exam Location:  Inpatient   Procedure: Transesophageal Echo, Cardiac Doppler, Color Doppler and 3D  Echo   Indications:     Aortic valve disease     History:         Patient has prior history of Echocardiogram examinations,  most                   recent 05/20/2020. Arrythmias:Atrial Fibrillation. AVR.  MVR. S/P                   Maze procedure.                   Aortic Valve: 25 mm pericardial valve is  present in the  aortic                   position. Procedure Date: 09/08/2012.                   Mitral Valve: 30 mm prosthetic annuloplasty ring valve is                   present in the mitral position. Procedure Date: 09/08/2012.     Sonographer:     Clayton Lefort RDCS (AE)  Referring Phys:  [place]  Diagnosing Phys: Eleonore Chiquito MD   PROCEDURE: After discussion of the risks and benefits of a TEE, an  informed consent was obtained from the patient. TEE procedure time was 22  minutes. The transesophogeal probe was passed without difficulty through  the esophogus of the patient. Imaged  were obtained with the patient in a left lateral decubitus position.  Sedation performed by different physician. The patient was monitored while  under deep sedation. Anesthestetic sedation was provided intravenously by  Anesthesiology: '337mg'$  of Propofol,  '100mg'$  of Lidocaine. Image quality was excellent. The patient's vital  signs; including heart rate, blood pressure, and oxygen saturation;  remained stable throughout the procedure. The patient developed no  complications during the procedure.   IMPRESSIONS     1. 25 mm Magna ease pericardial valve in the aortic position (09/08/2012).  There is moderate to severe central aortic regurgitation. Quantitation  difficult due to shadowing from the prosthetic valve. Suspect this is  related to deterioration as the  leaflets appear thickened. The leaflets are mildly restricted in systole.  There is no evidence of vegetation or endocarditis. There is concerns for  diastolic flow reveral in the descending aorta. The valve leaflets open  well and suspect that the increase   gradient across the valve is partially explained by AI. Vmax 3.7 m/s, MG  28 mmHG. Overall, suspect there is moderate to severe AI related to valve  deterioration. The LV cavity is normal size and but the holodiastolic flow  reversal does suggest severe  AI.Marland Kitchen The aortic valve  has been repaired/replaced. Aortic valve  regurgitation is moderate to severe. There is a 25 mm pericardial valve  present in the aortic position. Procedure Date: 09/08/2012.   2. Left ventricular ejection fraction, by estimation, is  60 to 65%. The  left ventricle has normal function. The left ventricle has no regional  wall motion abnormalities.   3. Right ventricular systolic function is normal. The right ventricular  size is normal.   4. S/p surgical LAA closure. No left atrial/left atrial appendage  thrombus was detected.   5. The mitral valve has been repaired/replaced. Mild mitral valve  regurgitation. There is a 30 mm prosthetic annuloplasty ring present in  the mitral position. Procedure Date: 09/08/2012. Echo findings are  consistent with normal structure and function of  the mitral valve prosthesis.   6. Aortic dilatation noted. There is mild dilatation of the aortic root,  measuring 40 mm.   FINDINGS   Left Ventricle: Left ventricular ejection fraction, by estimation, is 60  to 65%. The left ventricle has normal function. The left ventricle has no  regional wall motion abnormalities. The left ventricular internal cavity  size was normal in size.   Right Ventricle: The right ventricular size is normal. No increase in  right ventricular wall thickness. Right ventricular systolic function is  normal.   Left Atrium: S/p surgical LAA closure. Left atrial size was normal in  size. No left atrial/left atrial appendage thrombus was detected.   Right Atrium: Right atrial size was normal in size.   Pericardium: There is no evidence of pericardial effusion.   Mitral Valve: The mitral valve has been repaired/replaced. Mild mitral  valve regurgitation. There is a 30 mm prosthetic annuloplasty ring present  in the mitral position. Procedure Date: 09/08/2012. Echo findings are  consistent with normal structure and  function of the mitral valve prosthesis. The mean mitral valve gradient  is  5.4 mmHg with average heart rate of 69 bpm.   Tricuspid Valve: The tricuspid valve is grossly normal. Tricuspid valve  regurgitation is trivial. No evidence of tricuspid stenosis.   Aortic Valve: 25 mm Magna ease pericardial valve in the aortic position  (09/08/2012). There is moderate to severe central aortic regurgitation.  Quantitation difficult due to shadowing from the prosthetic valve. Suspect  this is related to deterioration as  the leaflets appear thickened. The leaflets are mildly restricted in  systole. There is no evidence of vegetation or endocarditis. There is  concerns for diastolic flow reveral in the descending aorta. The valve  leaflets open well and suspect that the  increase gradient across the valve is partially explained by AI. Vmax 3.7  m/s, MG 28 mmHG. Overall, suspect there is moderate to severe AI related  to valve deterioration. The LV cavity is normal size and but the  holodiastolic flow reversal does suggest  severe AI. The aortic valve has been repaired/replaced. Aortic valve  regurgitation is moderate to severe. Aortic regurgitation PHT measures 619  msec. Aortic valve mean gradient measures 28.0 mmHg. Aortic valve peak  gradient measures 56.0 mmHg. Aortic  valve area, by VTI measures 1.61 cm. There is a 25 mm pericardial valve  present in the aortic position. Procedure Date: 09/08/2012.   Pulmonic Valve: The pulmonic valve was grossly normal. Pulmonic valve  regurgitation is not visualized. No evidence of pulmonic stenosis.   Aorta: Aortic dilatation noted. There is mild dilatation of the aortic  root, measuring 40 mm.   Venous: The left upper pulmonary vein, left lower pulmonary vein, right  upper pulmonary vein and right lower pulmonary vein are normal.   IAS/Shunts: The atrial septum is grossly normal.      LEFT VENTRICLE  PLAX 2D  LVOT diam:  2.50 cm  LV SV:         135  LV SV Index:   62  LVOT Area:     4.91 cm      AORTIC VALVE   AV Area (Vmax):    1.52 cm  AV Area (VTI):     1.61 cm  AV Vmax:           374.00 cm/s  AV Vmean:          199.900 cm/s  AV VTI:            0.839 m  AV Peak Grad:      56.0 mmHg  AV Mean Grad:      28.0 mmHg  LVOT Vmax:         116.00 cm/s  LVOT VTI:          0.276 m  LVOT/AV VTI ratio: 0.33  AI PHT:            619 msec     AORTA  Ao Root diam: 4.00 cm  Ao Asc diam:  3.40 cm   MITRAL VALVE  MV Area VTI:  2.98 cm  SHUNTS  MV Mean grad: 5.4 mmHg  Systemic VTI:  0.28 m  MV VTI:       0.45 m    Systemic Diam: 2.50 cm   Eleonore Chiquito MD  Electronically signed by Eleonore Chiquito MD  Signature Date/Time: 02/03/2021/2:11:22 PM         Final       Impression:    This gentleman has stage C, asymptomatic, 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  He also had mitral valve annuloplasty and Maze procedure at that time.  His most recent echocardiogram shows a further increase in the left ventricular diastolic dimension to 5.5 cm.  He has trivial MR and has been maintaining sinus rhythm.  I think it would be best to proceed with redo aortic valve replacement to prevent further left ventricular dilatation and dysfunction.  I will plan to use an INSPIRIS RESILIA pericardial valve which should have a longer life span for him.  I discussed the use of a mechanical valve but I think the risk of anticoagulation and emboli is greater than the benefit in his age group.  I reviewed the echocardiogram images with the patient and his family and answered their questions.  He is inclined to proceed with surgery.  His gated coronary CTA in June 2022 showed mild disease in the LAD and minimal disease in the RCA and left circumflex.   Plan:   Redo AVR using a bioprosthetic valve.   Reginald Pollack, MD [facility] [phone]

## 2022-01-12 NOTE — Anesthesia Preprocedure Evaluation (Signed)
Anesthesia Evaluation  Patient identified by MRN, date of birth, ID band Patient awake    Reviewed: Allergy & Precautions, NPO status , Patient's Chart, lab work & pertinent test results  Airway Mallampati: II  TM Distance: >3 FB Neck ROM: Full    Dental  (+) Teeth Intact, Dental Advisory Given   Pulmonary neg pulmonary ROS   Pulmonary exam normal breath sounds clear to auscultation       Cardiovascular hypertension, Pt. on medications + Orthopnea  + dysrhythmias Atrial Fibrillation + Valvular Problems/Murmurs (s/p AVR, MVR) AI and AS  Rhythm:Regular Rate:Normal + Systolic murmurs and + Diastolic murmurs Echo 79/02/40: 1. Degenerative tissue bioprosthetic AVR Overall findings similar to TEE  done 05/20/20.   2. Left ventricular ejection fraction, by estimation, is 55 to 60%. The  left ventricle has normal function. The left ventricle has no regional  wall motion abnormalities. Left ventricular diastolic parameters are  indeterminate.   3. Right ventricular systolic function is normal. The right ventricular  size is normal. There is normal pulmonary artery systolic pressure.   4. Right atrial size was mildly dilated.   5. Post repair with 30 mm Sorin ring trivial residual MR with mean  diastolic gradient 7 mmhg No MS . The mitral valve has been  repaired/replaced. Trivial mitral valve regurgitation. There is a 30 mm  prosthetic annuloplasty ring present in the mitral  position. Procedure Date: 09/07/2012.   6. Tricuspid valve regurgitation is mild to moderate.   7. 2014 post AVR with 25 mm pericardial Magna Ease valve Leaflets are  thickened and calcified moderate AS and moderate to severe central AR .  The aortic valve has been repaired/replaced. Aortic valve regurgitation is  moderate to severe. Moderate aortic  valve stenosis. There is a 25 mm pericardial valve present in the aortic  position. Procedure Date: 09/07/2012.      Neuro/Psych  Neuromuscular disease (Bell's palsy)  negative psych ROS   GI/Hepatic negative GI ROS, Neg liver ROS,,,  Endo/Other  negative endocrine ROS    Renal/GU negative Renal ROS     Musculoskeletal  (+) Arthritis ,    Abdominal   Peds  Hematology negative hematology ROS (+)   Anesthesia Other Findings   Reproductive/Obstetrics                              Anesthesia Physical Anesthesia Plan  ASA: 4  Anesthesia Plan: General   Post-op Pain Management:    Induction: Intravenous  PONV Risk Score and Plan: 2 and Treatment may vary due to age or medical condition and Midazolam  Airway Management Planned: Oral ETT  Additional Equipment: Arterial line, CVP, PA Cath, TEE, 3D TEE and Ultrasound Guidance Line Placement  Intra-op Plan:   Post-operative Plan: Post-operative intubation/ventilation  Informed Consent: I have reviewed the patients History and Physical, chart, labs and discussed the procedure including the risks, benefits and alternatives for the proposed anesthesia with the patient or authorized representative who has indicated his/her understanding and acceptance.     Dental advisory given  Plan Discussed with: CRNA  Anesthesia Plan Comments:          Anesthesia Quick Evaluation

## 2022-01-13 ENCOUNTER — Encounter (HOSPITAL_COMMUNITY)
Admission: RE | Disposition: A | Discharge status: Home / Self Care | Payer: Self-pay | Source: Home / Self Care | Attending: Surgery

## 2022-01-13 ENCOUNTER — Inpatient Hospital Stay (HOSPITAL_COMMUNITY): Payer: BC Managed Care – PPO

## 2022-01-13 ENCOUNTER — Other Ambulatory Visit: Payer: Self-pay

## 2022-01-13 ENCOUNTER — Inpatient Hospital Stay (HOSPITAL_COMMUNITY)
Admission: RE | Admit: 2022-01-13 | Discharge: 2022-01-17 | DRG: 220 | Disposition: A | Discharge status: Home / Self Care | Payer: BC Managed Care – PPO | Attending: Surgery | Admitting: Surgery

## 2022-01-13 ENCOUNTER — Inpatient Hospital Stay (HOSPITAL_COMMUNITY): Payer: BC Managed Care – PPO | Admitting: Physician Assistant

## 2022-01-13 ENCOUNTER — Encounter (HOSPITAL_COMMUNITY): Payer: Self-pay | Admitting: Surgery

## 2022-01-13 DIAGNOSIS — Z8547 Personal history of malignant neoplasm of testis: Secondary | ICD-10-CM | POA: Diagnosis not present

## 2022-01-13 DIAGNOSIS — Z888 Allergy status to other drugs, medicaments and biological substances status: Secondary | ICD-10-CM

## 2022-01-13 DIAGNOSIS — I4891 Unspecified atrial fibrillation: Secondary | ICD-10-CM | POA: Diagnosis present

## 2022-01-13 DIAGNOSIS — E785 Hyperlipidemia, unspecified: Secondary | ICD-10-CM | POA: Diagnosis present

## 2022-01-13 DIAGNOSIS — D6959 Other secondary thrombocytopenia: Secondary | ICD-10-CM | POA: Diagnosis not present

## 2022-01-13 DIAGNOSIS — Z953 Presence of xenogenic heart valve: Secondary | ICD-10-CM

## 2022-01-13 DIAGNOSIS — Z79899 Other long term (current) drug therapy: Secondary | ICD-10-CM | POA: Diagnosis not present

## 2022-01-13 DIAGNOSIS — I110 Hypertensive heart disease with heart failure: Secondary | ICD-10-CM | POA: Diagnosis present

## 2022-01-13 DIAGNOSIS — D62 Acute posthemorrhagic anemia: Secondary | ICD-10-CM | POA: Diagnosis not present

## 2022-01-13 DIAGNOSIS — Z7982 Long term (current) use of aspirin: Secondary | ICD-10-CM | POA: Diagnosis not present

## 2022-01-13 DIAGNOSIS — I350 Nonrheumatic aortic (valve) stenosis: Secondary | ICD-10-CM | POA: Diagnosis not present

## 2022-01-13 DIAGNOSIS — M1711 Unilateral primary osteoarthritis, right knee: Secondary | ICD-10-CM | POA: Diagnosis present

## 2022-01-13 DIAGNOSIS — Z1152 Encounter for screening for COVID-19: Secondary | ICD-10-CM | POA: Diagnosis not present

## 2022-01-13 DIAGNOSIS — T82857A Stenosis of cardiac prosthetic devices, implants and grafts, initial encounter: Principal | ICD-10-CM | POA: Diagnosis present

## 2022-01-13 DIAGNOSIS — Z836 Family history of other diseases of the respiratory system: Secondary | ICD-10-CM | POA: Diagnosis not present

## 2022-01-13 DIAGNOSIS — Y831 Surgical operation with implant of artificial internal device as the cause of abnormal reaction of the patient, or of later complication, without mention of misadventure at the time of the procedure: Secondary | ICD-10-CM | POA: Diagnosis present

## 2022-01-13 DIAGNOSIS — I351 Nonrheumatic aortic (valve) insufficiency: Secondary | ICD-10-CM | POA: Diagnosis present

## 2022-01-13 DIAGNOSIS — I5022 Chronic systolic (congestive) heart failure: Secondary | ICD-10-CM | POA: Diagnosis present

## 2022-01-13 DIAGNOSIS — Z833 Family history of diabetes mellitus: Secondary | ICD-10-CM

## 2022-01-13 DIAGNOSIS — Z8249 Family history of ischemic heart disease and other diseases of the circulatory system: Secondary | ICD-10-CM | POA: Diagnosis not present

## 2022-01-13 DIAGNOSIS — Z952 Presence of prosthetic heart valve: Secondary | ICD-10-CM

## 2022-01-13 HISTORY — PX: AORTIC VALVE REPLACEMENT: SHX41

## 2022-01-13 HISTORY — PX: TEE WITHOUT CARDIOVERSION: SHX5443

## 2022-01-13 LAB — HEMOGLOBIN AND HEMATOCRIT, BLOOD
HCT: 33 % — ABNORMAL LOW (ref 39.0–52.0)
Hemoglobin: 11.6 g/dL — ABNORMAL LOW (ref 13.0–17.0)

## 2022-01-13 LAB — POCT I-STAT 7, (LYTES, BLD GAS, ICA,H+H)
Acid-base deficit: 3 mmol/L — ABNORMAL HIGH (ref 0.0–2.0)
Acid-base deficit: 2 mmol/L (ref 0.0–2.0)
Acid-base deficit: 4 mmol/L — ABNORMAL HIGH (ref 0.0–2.0)
Acid-base deficit: 5 mmol/L — ABNORMAL HIGH (ref 0.0–2.0)
Acid-base deficit: 6 mmol/L — ABNORMAL HIGH (ref 0.0–2.0)
Bicarbonate: 21.6 mmol/L (ref 20.0–28.0)
Bicarbonate: 23.4 mmol/L (ref 20.0–28.0)
Bicarbonate: 22.5 mmol/L (ref 20.0–28.0)
Bicarbonate: 19.6 mmol/L — ABNORMAL LOW (ref 20.0–28.0)
Bicarbonate: 20.6 mmol/L (ref 20.0–28.0)
Calcium, Ion: 1.12 mmol/L — ABNORMAL LOW (ref 1.15–1.40)
Calcium, Ion: 1.18 mmol/L (ref 1.15–1.40)
Calcium, Ion: 1.21 mmol/L (ref 1.15–1.40)
Calcium, Ion: 1.2 mmol/L (ref 1.15–1.40)
Calcium, Ion: 1.2 mmol/L (ref 1.15–1.40)
HCT: 32 % — ABNORMAL LOW (ref 39.0–52.0)
HCT: 33 % — ABNORMAL LOW (ref 39.0–52.0)
HCT: 40 % (ref 39.0–52.0)
HCT: 37 % — ABNORMAL LOW (ref 39.0–52.0)
HCT: 35 % — ABNORMAL LOW (ref 39.0–52.0)
Hemoglobin: 10.9 g/dL — ABNORMAL LOW (ref 13.0–17.0)
Hemoglobin: 11.2 g/dL — ABNORMAL LOW (ref 13.0–17.0)
Hemoglobin: 13.6 g/dL (ref 13.0–17.0)
Hemoglobin: 12.6 g/dL — ABNORMAL LOW (ref 13.0–17.0)
Hemoglobin: 11.9 g/dL — ABNORMAL LOW (ref 13.0–17.0)
O2 Saturation: 100 %
O2 Saturation: 99 %
O2 Saturation: 100 %
O2 Saturation: 99 %
O2 Saturation: 99 %
Patient temperature: 36.8
Patient temperature: 36.6
Patient temperature: 36.6
Potassium: 4.5 mmol/L (ref 3.5–5.1)
Potassium: 5.3 mmol/L — ABNORMAL HIGH (ref 3.5–5.1)
Potassium: 5.1 mmol/L (ref 3.5–5.1)
Potassium: 4.8 mmol/L (ref 3.5–5.1)
Potassium: 4.6 mmol/L (ref 3.5–5.1)
Sodium: 139 mmol/L (ref 135–145)
Sodium: 138 mmol/L (ref 135–145)
Sodium: 138 mmol/L (ref 135–145)
Sodium: 138 mmol/L (ref 135–145)
Sodium: 139 mmol/L (ref 135–145)
TCO2: 23 mmol/L (ref 22–32)
TCO2: 25 mmol/L (ref 22–32)
TCO2: 24 mmol/L (ref 22–32)
TCO2: 21 mmol/L — ABNORMAL LOW (ref 22–32)
TCO2: 22 mmol/L (ref 22–32)
pCO2 arterial: 34.1 mmHg (ref 32–48)
pCO2 arterial: 41.3 mmHg (ref 32–48)
pCO2 arterial: 44.6 mmHg (ref 32–48)
pCO2 arterial: 34.8 mmHg (ref 32–48)
pCO2 arterial: 42.9 mmHg (ref 32–48)
pH, Arterial: 7.41 (ref 7.35–7.45)
pH, Arterial: 7.362 (ref 7.35–7.45)
pH, Arterial: 7.31 — ABNORMAL LOW (ref 7.35–7.45)
pH, Arterial: 7.357 (ref 7.35–7.45)
pH, Arterial: 7.287 — ABNORMAL LOW (ref 7.35–7.45)
pO2, Arterial: 372 mmHg — ABNORMAL HIGH (ref 83–108)
pO2, Arterial: 140 mmHg — ABNORMAL HIGH (ref 83–108)
pO2, Arterial: 197 mmHg — ABNORMAL HIGH (ref 83–108)
pO2, Arterial: 151 mmHg — ABNORMAL HIGH (ref 83–108)
pO2, Arterial: 150 mmHg — ABNORMAL HIGH (ref 83–108)

## 2022-01-13 LAB — POCT I-STAT, CHEM 8
BUN: 18 mg/dL (ref 8–23)
BUN: 18 mg/dL (ref 8–23)
BUN: 17 mg/dL (ref 8–23)
BUN: 18 mg/dL (ref 8–23)
BUN: 19 mg/dL (ref 8–23)
BUN: 20 mg/dL (ref 8–23)
Calcium, Ion: 1.29 mmol/L (ref 1.15–1.40)
Calcium, Ion: 1.24 mmol/L (ref 1.15–1.40)
Calcium, Ion: 1.16 mmol/L (ref 1.15–1.40)
Calcium, Ion: 1.2 mmol/L (ref 1.15–1.40)
Calcium, Ion: 1.18 mmol/L (ref 1.15–1.40)
Calcium, Ion: 1.18 mmol/L (ref 1.15–1.40)
Chloride: 105 mmol/L (ref 98–111)
Chloride: 105 mmol/L (ref 98–111)
Chloride: 103 mmol/L (ref 98–111)
Chloride: 103 mmol/L (ref 98–111)
Chloride: 105 mmol/L (ref 98–111)
Chloride: 106 mmol/L (ref 98–111)
Creatinine, Ser: 1.1 mg/dL (ref 0.61–1.24)
Creatinine, Ser: 1.1 mg/dL (ref 0.61–1.24)
Creatinine, Ser: 1 mg/dL (ref 0.61–1.24)
Creatinine, Ser: 1 mg/dL (ref 0.61–1.24)
Creatinine, Ser: 1.1 mg/dL (ref 0.61–1.24)
Creatinine, Ser: 1.2 mg/dL (ref 0.61–1.24)
Glucose, Bld: 101 mg/dL — ABNORMAL HIGH (ref 70–99)
Glucose, Bld: 105 mg/dL — ABNORMAL HIGH (ref 70–99)
Glucose, Bld: 111 mg/dL — ABNORMAL HIGH (ref 70–99)
Glucose, Bld: 139 mg/dL — ABNORMAL HIGH (ref 70–99)
Glucose, Bld: 126 mg/dL — ABNORMAL HIGH (ref 70–99)
Glucose, Bld: 128 mg/dL — ABNORMAL HIGH (ref 70–99)
HCT: 40 % (ref 39.0–52.0)
HCT: 36 % — ABNORMAL LOW (ref 39.0–52.0)
HCT: 31 % — ABNORMAL LOW (ref 39.0–52.0)
HCT: 32 % — ABNORMAL LOW (ref 39.0–52.0)
HCT: 32 % — ABNORMAL LOW (ref 39.0–52.0)
HCT: 33 % — ABNORMAL LOW (ref 39.0–52.0)
Hemoglobin: 13.6 g/dL (ref 13.0–17.0)
Hemoglobin: 12.2 g/dL — ABNORMAL LOW (ref 13.0–17.0)
Hemoglobin: 10.5 g/dL — ABNORMAL LOW (ref 13.0–17.0)
Hemoglobin: 10.9 g/dL — ABNORMAL LOW (ref 13.0–17.0)
Hemoglobin: 10.9 g/dL — ABNORMAL LOW (ref 13.0–17.0)
Hemoglobin: 11.2 g/dL — ABNORMAL LOW (ref 13.0–17.0)
Potassium: 4.1 mmol/L (ref 3.5–5.1)
Potassium: 4.4 mmol/L (ref 3.5–5.1)
Potassium: 4.5 mmol/L (ref 3.5–5.1)
Potassium: 5.1 mmol/L (ref 3.5–5.1)
Potassium: 5.4 mmol/L — ABNORMAL HIGH (ref 3.5–5.1)
Potassium: 5.9 mmol/L — ABNORMAL HIGH (ref 3.5–5.1)
Sodium: 141 mmol/L (ref 135–145)
Sodium: 139 mmol/L (ref 135–145)
Sodium: 138 mmol/L (ref 135–145)
Sodium: 136 mmol/L (ref 135–145)
Sodium: 137 mmol/L (ref 135–145)
Sodium: 138 mmol/L (ref 135–145)
TCO2: 28 mmol/L (ref 22–32)
TCO2: 25 mmol/L (ref 22–32)
TCO2: 24 mmol/L (ref 22–32)
TCO2: 27 mmol/L (ref 22–32)
TCO2: 24 mmol/L (ref 22–32)
TCO2: 27 mmol/L (ref 22–32)

## 2022-01-13 LAB — POCT I-STAT EG7
Acid-base deficit: 3 mmol/L — ABNORMAL HIGH (ref 0.0–2.0)
Bicarbonate: 22.3 mmol/L (ref 20.0–28.0)
Calcium, Ion: 1.18 mmol/L (ref 1.15–1.40)
HCT: 32 % — ABNORMAL LOW (ref 39.0–52.0)
Hemoglobin: 10.9 g/dL — ABNORMAL LOW (ref 13.0–17.0)
O2 Saturation: 77 %
Potassium: 4.5 mmol/L (ref 3.5–5.1)
Sodium: 140 mmol/L (ref 135–145)
TCO2: 24 mmol/L (ref 22–32)
pCO2, Ven: 39.9 mmHg — ABNORMAL LOW (ref 44–60)
pH, Ven: 7.356 (ref 7.25–7.43)
pO2, Ven: 43 mmHg (ref 32–45)

## 2022-01-13 LAB — PLATELET COUNT: Platelets: 117 10*3/uL — ABNORMAL LOW (ref 150–400)

## 2022-01-13 LAB — BASIC METABOLIC PANEL
Anion gap: 7 (ref 5–15)
BUN: 19 mg/dL (ref 8–23)
CO2: 21 mmol/L — ABNORMAL LOW (ref 22–32)
Calcium: 8.2 mg/dL — ABNORMAL LOW (ref 8.9–10.3)
Chloride: 110 mmol/L (ref 98–111)
Creatinine, Ser: 1.26 mg/dL — ABNORMAL HIGH (ref 0.61–1.24)
GFR, Estimated: >60 mL/min (ref >60)
Glucose, Bld: 119 mg/dL — ABNORMAL HIGH (ref 70–99)
Potassium: 4.5 mmol/L (ref 3.5–5.1)
Sodium: 138 mmol/L (ref 135–145)

## 2022-01-13 LAB — CBC
HCT: 39.3 % (ref 39.0–52.0)
HCT: 36.4 % — ABNORMAL LOW (ref 39.0–52.0)
Hemoglobin: 13.9 g/dL (ref 13.0–17.0)
Hemoglobin: 12.8 g/dL — ABNORMAL LOW (ref 13.0–17.0)
MCH: 31 pg (ref 26.0–34.0)
MCH: 30.9 pg (ref 26.0–34.0)
MCHC: 35.4 g/dL (ref 30.0–36.0)
MCHC: 35.2 g/dL (ref 30.0–36.0)
MCV: 87.5 fL (ref 80.0–100.0)
MCV: 87.9 fL (ref 80.0–100.0)
Platelets: 125 10*3/uL — ABNORMAL LOW (ref 150–400)
Platelets: 97 10*3/uL — ABNORMAL LOW (ref 150–400)
RBC: 4.49 MIL/uL (ref 4.22–5.81)
RBC: 4.14 MIL/uL — ABNORMAL LOW (ref 4.22–5.81)
RDW: 13 % (ref 11.5–15.5)
RDW: 13.2 % (ref 11.5–15.5)
WBC: 23 10*3/uL — ABNORMAL HIGH (ref 4.0–10.5)
WBC: 15.1 10*3/uL — ABNORMAL HIGH (ref 4.0–10.5)
nRBC: 0 % (ref 0.0–0.2)
nRBC: 0 % (ref 0.0–0.2)

## 2022-01-13 LAB — PREPARE RBC (CROSSMATCH)

## 2022-01-13 LAB — PROTIME-INR
INR: 1.5 — ABNORMAL HIGH (ref 0.8–1.2)
Prothrombin Time: 17.6 seconds — ABNORMAL HIGH (ref 11.4–15.2)

## 2022-01-13 LAB — APTT: aPTT: 34 seconds (ref 24–36)

## 2022-01-13 LAB — GLUCOSE, CAPILLARY
Glucose-Capillary: 126 mg/dL — ABNORMAL HIGH (ref 70–99)
Glucose-Capillary: 123 mg/dL — ABNORMAL HIGH (ref 70–99)
Glucose-Capillary: 114 mg/dL — ABNORMAL HIGH (ref 70–99)
Glucose-Capillary: 129 mg/dL — ABNORMAL HIGH (ref 70–99)
Glucose-Capillary: 127 mg/dL — ABNORMAL HIGH (ref 70–99)
Glucose-Capillary: 149 mg/dL — ABNORMAL HIGH (ref 70–99)

## 2022-01-13 LAB — MAGNESIUM: Magnesium: 3.3 mg/dL — ABNORMAL HIGH (ref 1.7–2.4)

## 2022-01-13 SURGERY — REDO AORTIC VALVE REPLACEMENT (AVR)
Anesthesia: General | Site: Chest

## 2022-01-13 MED ORDER — CHLORHEXIDINE GLUCONATE 0.12 % MT SOLN
15.0000 mL | OROMUCOSAL | Status: AC
  Administered 2022-01-13: 15 mL via OROMUCOSAL

## 2022-01-13 MED ORDER — LIDOCAINE 2% (20 MG/ML) 5 ML SYRINGE
INTRAMUSCULAR | Status: AC
  Filled 2022-01-13: qty 5

## 2022-01-13 MED ORDER — PROPOFOL 10 MG/ML IV BOLUS
INTRAVENOUS | Status: DC | PRN
  Administered 2022-01-13: 70 mg via INTRAVENOUS

## 2022-01-13 MED ORDER — EPHEDRINE SULFATE-NACL 50-0.9 MG/10ML-% IV SOSY
PREFILLED_SYRINGE | INTRAVENOUS | Status: DC | PRN
  Administered 2022-01-13: 2.5 mg via INTRAVENOUS

## 2022-01-13 MED ORDER — METOPROLOL TARTRATE 25 MG/10 ML ORAL SUSPENSION: 12.5000 mg | Freq: Two times a day (BID) | ORAL | Status: DC

## 2022-01-13 MED ORDER — LACTATED RINGERS IV SOLN: INTRAVENOUS | Status: DC | PRN

## 2022-01-13 MED ORDER — CHLORHEXIDINE GLUCONATE 0.12 % MT SOLN: 15.0000 mL | Freq: Once | OROMUCOSAL | Status: AC

## 2022-01-13 MED ORDER — FENTANYL CITRATE (PF) 250 MCG/5ML IJ SOLN
INTRAMUSCULAR | Status: AC
  Filled 2022-01-13: qty 5

## 2022-01-13 MED ORDER — SODIUM CHLORIDE 0.45 % IV SOLN: INTRAVENOUS | Status: DC | PRN

## 2022-01-13 MED ORDER — ACETAMINOPHEN 160 MG/5ML PO SOLN: 1000.0000 mg | Freq: Four times a day (QID) | ORAL | Status: DC

## 2022-01-13 MED ORDER — PHENYLEPHRINE 80 MCG/ML (10ML) SYRINGE FOR IV PUSH (FOR BLOOD PRESSURE SUPPORT)
PREFILLED_SYRINGE | INTRAVENOUS | Status: DC | PRN
  Administered 2022-01-13: 120 ug via INTRAVENOUS

## 2022-01-13 MED ORDER — VANCOMYCIN HCL IN DEXTROSE 1-5 GM/200ML-% IV SOLN
1000.0000 mg | Freq: Once | INTRAVENOUS | Status: AC
  Administered 2022-01-13: 1000 mg via INTRAVENOUS
  Filled 2022-01-13: qty 200

## 2022-01-13 MED ORDER — BISACODYL 5 MG PO TBEC
10.0000 mg | DELAYED_RELEASE_TABLET | Freq: Every day | ORAL | Status: DC
  Administered 2022-01-14 – 2022-01-16 (×3): 10 mg via ORAL
  Filled 2022-01-13 (×3): qty 2

## 2022-01-13 MED ORDER — OXYCODONE HCL 5 MG PO TABS: 5.0000 mg | ORAL_TABLET | ORAL | Status: DC | PRN

## 2022-01-13 MED ORDER — SODIUM CHLORIDE 0.9 % IV SOLN: INTRAVENOUS | Status: DC | PRN

## 2022-01-13 MED ORDER — METOPROLOL TARTRATE 12.5 MG HALF TABLET
ORAL_TABLET | ORAL | Status: AC
  Administered 2022-01-13: 12.5 mg via ORAL
  Filled 2022-01-13: qty 1

## 2022-01-13 MED ORDER — ONDANSETRON HCL 4 MG/2ML IJ SOLN: 4.0000 mg | Freq: Four times a day (QID) | INTRAMUSCULAR | Status: DC | PRN

## 2022-01-13 MED ORDER — ALBUMIN HUMAN 5 % IV SOLN
250.0000 mL | INTRAVENOUS | Status: DC | PRN
  Administered 2022-01-13 (×3): 12.5 g via INTRAVENOUS
  Filled 2022-01-13: qty 250

## 2022-01-13 MED ORDER — SODIUM CHLORIDE 0.9% FLUSH: 3.0000 mL | INTRAVENOUS | Status: DC | PRN

## 2022-01-13 MED ORDER — ACETAMINOPHEN 500 MG PO TABS
1000.0000 mg | ORAL_TABLET | Freq: Four times a day (QID) | ORAL | Status: DC
  Administered 2022-01-14 – 2022-01-17 (×11): 1000 mg via ORAL
  Filled 2022-01-13 (×10): qty 2

## 2022-01-13 MED ORDER — CEFAZOLIN SODIUM-DEXTROSE 2-4 GM/100ML-% IV SOLN
2.0000 g | Freq: Three times a day (TID) | INTRAVENOUS | Status: AC
  Administered 2022-01-13 – 2022-01-15 (×6): 2 g via INTRAVENOUS
  Filled 2022-01-13 (×6): qty 100

## 2022-01-13 MED ORDER — MORPHINE SULFATE (PF) 2 MG/ML IV SOLN
1.0000 mg | INTRAVENOUS | Status: DC | PRN
  Administered 2022-01-14: 2 mg via INTRAVENOUS
  Filled 2022-01-13: qty 1

## 2022-01-13 MED ORDER — ACETAMINOPHEN 650 MG RE SUPP
650.0000 mg | Freq: Once | RECTAL | Status: AC
  Administered 2022-01-13: 650 mg via RECTAL

## 2022-01-13 MED ORDER — POTASSIUM CHLORIDE 10 MEQ/50ML IV SOLN
10.0000 meq | INTRAVENOUS | Status: AC
  Filled 2022-01-13: qty 50

## 2022-01-13 MED ORDER — DEXMEDETOMIDINE HCL IN NACL 400 MCG/100ML IV SOLN: 0.0000 ug/kg/h | INTRAVENOUS | Status: DC

## 2022-01-13 MED ORDER — PROTAMINE SULFATE 10 MG/ML IV SOLN
INTRAVENOUS | Status: AC
  Filled 2022-01-13: qty 50

## 2022-01-13 MED ORDER — ATORVASTATIN CALCIUM 10 MG PO TABS
20.0000 mg | ORAL_TABLET | Freq: Every day | ORAL | Status: DC
  Administered 2022-01-14 – 2022-01-16 (×3): 20 mg via ORAL
  Filled 2022-01-13 (×3): qty 2

## 2022-01-13 MED ORDER — METOPROLOL TARTRATE 5 MG/5ML IV SOLN: 2.5000 mg | INTRAVENOUS | Status: DC | PRN

## 2022-01-13 MED ORDER — CHLORHEXIDINE GLUCONATE CLOTH 2 % EX PADS
6.0000 | MEDICATED_PAD | Freq: Every day | CUTANEOUS | Status: DC
  Administered 2022-01-13 – 2022-01-16 (×4): 6 via TOPICAL

## 2022-01-13 MED ORDER — LACTATED RINGERS IV SOLN: 500.0000 mL | Freq: Once | INTRAVENOUS | Status: DC | PRN

## 2022-01-13 MED ORDER — ALBUMIN HUMAN 5 % IV SOLN
12.5000 g | Freq: Once | INTRAVENOUS | Status: AC
  Administered 2022-01-13: 12.5 g via INTRAVENOUS
  Filled 2022-01-13: qty 250

## 2022-01-13 MED ORDER — SODIUM BICARBONATE 8.4 % IV SOLN
50.0000 meq | Freq: Once | INTRAVENOUS | Status: AC
  Administered 2022-01-13: 50 meq via INTRAVENOUS

## 2022-01-13 MED ORDER — HEPARIN SODIUM (PORCINE) 1000 UNIT/ML IJ SOLN
INTRAMUSCULAR | Status: AC
  Filled 2022-01-13: qty 1

## 2022-01-13 MED ORDER — ACETAMINOPHEN 160 MG/5ML PO SOLN: 650.0000 mg | Freq: Once | ORAL | Status: AC

## 2022-01-13 MED ORDER — HEPARIN SODIUM (PORCINE) 1000 UNIT/ML IJ SOLN
INTRAMUSCULAR | Status: AC
  Filled 2022-01-13: qty 10

## 2022-01-13 MED ORDER — TRAMADOL HCL 50 MG PO TABS
50.0000 mg | ORAL_TABLET | ORAL | Status: DC | PRN
  Administered 2022-01-13 – 2022-01-14 (×2): 50 mg via ORAL
  Filled 2022-01-13 (×2): qty 1

## 2022-01-13 MED ORDER — ROCURONIUM BROMIDE 10 MG/ML (PF) SYRINGE
PREFILLED_SYRINGE | INTRAVENOUS | Status: AC
  Filled 2022-01-13: qty 20

## 2022-01-13 MED ORDER — INSULIN REGULAR(HUMAN) IN NACL 100-0.9 UT/100ML-% IV SOLN: INTRAVENOUS | Status: DC

## 2022-01-13 MED ORDER — BISACODYL 10 MG RE SUPP: 10.0000 mg | Freq: Every day | RECTAL | Status: DC

## 2022-01-13 MED ORDER — METOPROLOL TARTRATE 12.5 MG HALF TABLET: 12.5000 mg | ORAL_TABLET | Freq: Two times a day (BID) | ORAL | Status: DC

## 2022-01-13 MED ORDER — PLASMA-LYTE A IV SOLN
INTRAVENOUS | Status: DC | PRN
  Administered 2022-01-13: 500 mL via INTRAVASCULAR

## 2022-01-13 MED ORDER — ORAL CARE MOUTH RINSE: 15.0000 mL | OROMUCOSAL | Status: DC | PRN

## 2022-01-13 MED ORDER — SODIUM CHLORIDE 0.9 % IV SOLN: 250.0000 mL | INTRAVENOUS | Status: DC

## 2022-01-13 MED ORDER — MIDAZOLAM HCL (PF) 5 MG/ML IJ SOLN
INTRAMUSCULAR | Status: DC | PRN
  Administered 2022-01-13 (×4): 2 mg via INTRAVENOUS
  Administered 2022-01-13: 1 mg via INTRAVENOUS

## 2022-01-13 MED ORDER — THROMBIN 20000 UNITS EX SOLR
CUTANEOUS | Status: DC | PRN
  Administered 2022-01-13: 20000 [IU] via TOPICAL

## 2022-01-13 MED ORDER — ORAL CARE MOUTH RINSE
15.0000 mL | OROMUCOSAL | Status: DC
  Administered 2022-01-13: 15 mL via OROMUCOSAL

## 2022-01-13 MED ORDER — DOCUSATE SODIUM 100 MG PO CAPS
200.0000 mg | ORAL_CAPSULE | Freq: Every day | ORAL | Status: DC
  Administered 2022-01-14 – 2022-01-16 (×3): 200 mg via ORAL
  Filled 2022-01-13 (×3): qty 2

## 2022-01-13 MED ORDER — MIDAZOLAM HCL 2 MG/2ML IJ SOLN: 2.0000 mg | INTRAMUSCULAR | Status: DC | PRN

## 2022-01-13 MED ORDER — MIDAZOLAM HCL (PF) 10 MG/2ML IJ SOLN
INTRAMUSCULAR | Status: AC
  Filled 2022-01-13: qty 2

## 2022-01-13 MED ORDER — PHENYLEPHRINE 80 MCG/ML (10ML) SYRINGE FOR IV PUSH (FOR BLOOD PRESSURE SUPPORT)
PREFILLED_SYRINGE | INTRAVENOUS | Status: AC
  Filled 2022-01-13: qty 10

## 2022-01-13 MED ORDER — FAMOTIDINE IN NACL 20-0.9 MG/50ML-% IV SOLN
20.0000 mg | Freq: Two times a day (BID) | INTRAVENOUS | Status: AC
  Administered 2022-01-13 (×2): 20 mg via INTRAVENOUS
  Filled 2022-01-13 (×2): qty 50

## 2022-01-13 MED ORDER — SODIUM CHLORIDE 0.9% FLUSH
3.0000 mL | Freq: Two times a day (BID) | INTRAVENOUS | Status: DC
  Administered 2022-01-14 – 2022-01-15 (×3): 3 mL via INTRAVENOUS

## 2022-01-13 MED ORDER — FENTANYL CITRATE (PF) 250 MCG/5ML IJ SOLN
INTRAMUSCULAR | Status: DC | PRN
  Administered 2022-01-13: 50 ug via INTRAVENOUS
  Administered 2022-01-13 (×3): 100 ug via INTRAVENOUS
  Administered 2022-01-13: 150 ug via INTRAVENOUS
  Administered 2022-01-13 (×4): 100 ug via INTRAVENOUS
  Administered 2022-01-13 (×2): 50 ug via INTRAVENOUS

## 2022-01-13 MED ORDER — 0.9 % SODIUM CHLORIDE (POUR BTL) OPTIME
TOPICAL | Status: DC | PRN
  Administered 2022-01-13: 1000 mL
  Administered 2022-01-13: 5000 mL

## 2022-01-13 MED ORDER — LACTATED RINGERS IV SOLN: INTRAVENOUS | Status: DC

## 2022-01-13 MED ORDER — METOPROLOL TARTRATE 12.5 MG HALF TABLET: 12.5000 mg | ORAL_TABLET | Freq: Once | ORAL | Status: AC

## 2022-01-13 MED ORDER — ASPIRIN 325 MG PO TBEC
325.0000 mg | DELAYED_RELEASE_TABLET | Freq: Every day | ORAL | Status: DC
  Administered 2022-01-14 – 2022-01-17 (×4): 325 mg via ORAL
  Filled 2022-01-13 (×4): qty 1

## 2022-01-13 MED ORDER — ASPIRIN 81 MG PO CHEW: 324.0000 mg | CHEWABLE_TABLET | Freq: Every day | ORAL | Status: DC

## 2022-01-13 MED ORDER — ROCURONIUM BROMIDE 10 MG/ML (PF) SYRINGE
PREFILLED_SYRINGE | INTRAVENOUS | Status: DC | PRN
  Administered 2022-01-13: 50 mg via INTRAVENOUS
  Administered 2022-01-13: 20 mg via INTRAVENOUS
  Administered 2022-01-13: 50 mg via INTRAVENOUS
  Administered 2022-01-13: 100 mg via INTRAVENOUS

## 2022-01-13 MED ORDER — NITROGLYCERIN IN D5W 200-5 MCG/ML-% IV SOLN: 0.0000 ug/min | INTRAVENOUS | Status: DC

## 2022-01-13 MED ORDER — ~~LOC~~ CARDIAC SURGERY, PATIENT & FAMILY EDUCATION
Freq: Once | Status: DC
  Filled 2022-01-13: qty 1

## 2022-01-13 MED ORDER — MAGNESIUM SULFATE 4 GM/100ML IV SOLN
4.0000 g | Freq: Once | INTRAVENOUS | Status: AC
  Administered 2022-01-13: 4 g via INTRAVENOUS
  Filled 2022-01-13: qty 100

## 2022-01-13 MED ORDER — HEPARIN SODIUM (PORCINE) 1000 UNIT/ML IJ SOLN
INTRAMUSCULAR | Status: DC | PRN
  Administered 2022-01-13: 33000 [IU] via INTRAVENOUS

## 2022-01-13 MED ORDER — THROMBIN (RECOMBINANT) 20000 UNITS EX SOLR
CUTANEOUS | Status: AC
  Filled 2022-01-13: qty 20000

## 2022-01-13 MED ORDER — SODIUM CHLORIDE 0.9 % IV SOLN: INTRAVENOUS | Status: DC

## 2022-01-13 MED ORDER — DEXTROSE 50 % IV SOLN: 0.0000 mL | INTRAVENOUS | Status: DC | PRN

## 2022-01-13 MED ORDER — SODIUM CHLORIDE (PF) 0.9 % IJ SOLN: OROMUCOSAL | Status: DC | PRN

## 2022-01-13 MED ORDER — EPHEDRINE 5 MG/ML INJ
INTRAVENOUS | Status: AC
  Filled 2022-01-13: qty 5

## 2022-01-13 MED ORDER — PROPOFOL 10 MG/ML IV BOLUS
INTRAVENOUS | Status: AC
  Filled 2022-01-13: qty 20

## 2022-01-13 MED ORDER — CHLORHEXIDINE GLUCONATE 4 % EX LIQD: 30.0000 mL | CUTANEOUS | Status: DC

## 2022-01-13 MED ORDER — HEMOSTATIC AGENTS (NO CHARGE) OPTIME
TOPICAL | Status: DC | PRN
  Administered 2022-01-13: 1 via TOPICAL

## 2022-01-13 MED ORDER — CHLORHEXIDINE GLUCONATE 0.12 % MT SOLN
OROMUCOSAL | Status: AC
  Administered 2022-01-13: 15 mL via OROMUCOSAL
  Filled 2022-01-13: qty 15

## 2022-01-13 MED ORDER — PANTOPRAZOLE SODIUM 40 MG PO TBEC
40.0000 mg | DELAYED_RELEASE_TABLET | Freq: Every day | ORAL | Status: DC
  Administered 2022-01-15 – 2022-01-17 (×3): 40 mg via ORAL
  Filled 2022-01-13 (×3): qty 1

## 2022-01-13 MED ORDER — PROTAMINE SULFATE 10 MG/ML IV SOLN
INTRAVENOUS | Status: DC | PRN
  Administered 2022-01-13: 300 mg via INTRAVENOUS

## 2022-01-13 MED ORDER — PHENYLEPHRINE HCL-NACL 20-0.9 MG/250ML-% IV SOLN: 0.0000 ug/min | INTRAVENOUS | Status: DC

## 2022-01-13 SURGICAL SUPPLY — 94 items
ADAPTER CARDIO PERF ANTE/RETRO (ADAPTER) ×2 IMPLANT
BAG DECANTER FOR FLEXI CONT (MISCELLANEOUS) ×2 IMPLANT
BLADE CLIPPER SURG (BLADE) ×2 IMPLANT
BLADE CORE FAN STRYKER (BLADE) ×2 IMPLANT
BLADE MIC 41X13 (BLADE) ×2 IMPLANT
BLADE STERNUM SYSTEM 6 (BLADE) ×2 IMPLANT
BLADE SURG 15 STRL LF DISP TIS (BLADE) ×2 IMPLANT
BLADE SURG 15 STRL SS (BLADE) ×4
CANISTER SUCT 3000ML PPV (MISCELLANEOUS) ×2 IMPLANT
CANNULA ARTERIAL VENT 3/8 20FR (CANNULA) IMPLANT
CANNULA GUNDRY RCSP 15FR (MISCELLANEOUS) ×2 IMPLANT
CANNULA MC2 2 STG 36/46 NON-V (CANNULA) IMPLANT
CANNULA SUMP PERICARDIAL (CANNULA) ×2 IMPLANT
CANNULA VENOUS 2 STG 34/46 (CANNULA) ×2
CATH HEART VENT LEFT (CATHETERS) ×2 IMPLANT
CATH ROBINSON RED A/P 18FR (CATHETERS) ×6 IMPLANT
CATH THORACIC 28FR (CATHETERS) IMPLANT
CATH THORACIC 36FR (CATHETERS) IMPLANT
CATH THORACIC 36FR RT ANG (CATHETERS) IMPLANT
CNTNR URN SCR LID CUP LEK RST (MISCELLANEOUS) IMPLANT
CONN 1/2X1/2X1/2  BEN (MISCELLANEOUS) ×2
CONN 1/2X1/2X1/2 BEN (MISCELLANEOUS) ×2 IMPLANT
CONN 3/8X1/2 ST GISH (MISCELLANEOUS) ×4 IMPLANT
CONT SPEC 4OZ STRL OR WHT (MISCELLANEOUS) ×4
COVER SURGICAL LIGHT HANDLE (MISCELLANEOUS) ×2 IMPLANT
DEVICE SUT CK QUICK LOAD INDV (Prosthesis & Implant Heart) IMPLANT
DEVICE SUT CK QUICK LOAD MINI (Prosthesis & Implant Heart) IMPLANT
DEVICE TROCAR PUNCTURE CLOSURE (ENDOMECHANICALS) IMPLANT
DRAPE CARDIOVASCULAR INCISE (DRAPES) ×2
DRAPE SLUSH/WARMER DISC (DRAPES) IMPLANT
DRAPE SRG 135X102X78XABS (DRAPES) ×2 IMPLANT
DRSG COVADERM 4X14 (GAUZE/BANDAGES/DRESSINGS) ×2 IMPLANT
ELECT CAUTERY BLADE 6.4 (BLADE) ×2 IMPLANT
ELECT REM PT RETURN 9FT ADLT (ELECTROSURGICAL) ×4
ELECTRODE REM PT RTRN 9FT ADLT (ELECTROSURGICAL) ×4 IMPLANT
FELT TEFLON 1X6 (MISCELLANEOUS) ×4 IMPLANT
GAUZE 4X4 16PLY ~~LOC~~+RFID DBL (SPONGE) ×2 IMPLANT
GAUZE SPONGE 4X4 12PLY STRL (GAUZE/BANDAGES/DRESSINGS) ×2 IMPLANT
GLOVE BIO SURGEON STRL SZ 6.5 (GLOVE) IMPLANT
GLOVE BIOGEL PI IND STRL 6 (GLOVE) IMPLANT
GLOVE SS BIOGEL STRL SZ 6 (GLOVE) IMPLANT
GLOVE SURG MICRO LTX SZ7 (GLOVE) ×4 IMPLANT
GOWN STRL REUS W/ TWL LRG LVL3 (GOWN DISPOSABLE) ×8 IMPLANT
GOWN STRL REUS W/ TWL XL LVL3 (GOWN DISPOSABLE) ×2 IMPLANT
GOWN STRL REUS W/TWL LRG LVL3 (GOWN DISPOSABLE) ×16
GOWN STRL REUS W/TWL XL LVL3 (GOWN DISPOSABLE) ×2
HEMOSTAT POWDER SURGIFOAM 1G (HEMOSTASIS) ×6 IMPLANT
HEMOSTAT SURGICEL 2X14 (HEMOSTASIS) ×2 IMPLANT
INSERT FOGARTY XLG (MISCELLANEOUS) IMPLANT
KIT BASIN OR (CUSTOM PROCEDURE TRAY) ×2 IMPLANT
KIT CATH CPB BARTLE (MISCELLANEOUS) ×2 IMPLANT
KIT SUCTION CATH 14FR (SUCTIONS) ×2 IMPLANT
KIT SUT CK MINI COMBO 4X17 (Prosthesis & Implant Heart) IMPLANT
KIT TURNOVER KIT B (KITS) ×2 IMPLANT
LEAD PACING MYOCARDI (MISCELLANEOUS) IMPLANT
LINE VENT (MISCELLANEOUS) IMPLANT
LOOP VESSEL SUPERMAXI WHITE (MISCELLANEOUS) ×2 IMPLANT
NS IRRIG 1000ML POUR BTL (IV SOLUTION) ×10 IMPLANT
PACK E OPEN HEART (SUTURE) ×2 IMPLANT
PACK OPEN HEART (CUSTOM PROCEDURE TRAY) ×2 IMPLANT
PAD ARMBOARD 7.5X6 YLW CONV (MISCELLANEOUS) ×4 IMPLANT
PAD DEFIB R2 (MISCELLANEOUS) ×2 IMPLANT
PAD ELECT DEFIB RADIOL ZOLL (MISCELLANEOUS) ×2 IMPLANT
POSITIONER HEAD DONUT 9IN (MISCELLANEOUS) ×2 IMPLANT
SET MPS 3-ND DEL (MISCELLANEOUS) IMPLANT
SUT BONE WAX W31G (SUTURE) ×2 IMPLANT
SUT EB EXC GRN/WHT 2-0 V-5 (SUTURE) ×4 IMPLANT
SUT ETHIBON EXCEL 2-0 V-5 (SUTURE) IMPLANT
SUT ETHIBOND 2 0 SH (SUTURE) ×2
SUT ETHIBOND 2 0 SH 36X2 (SUTURE) IMPLANT
SUT ETHIBOND V-5 VALVE (SUTURE) IMPLANT
SUT PROLENE 3 0 SH 1 (SUTURE) IMPLANT
SUT PROLENE 3 0 SH1 36 (SUTURE) ×2 IMPLANT
SUT PROLENE 4 0 RB 1 (SUTURE) ×8
SUT PROLENE 4-0 RB1 .5 CRCL 36 (SUTURE) IMPLANT
SUT PROLENE 6 0 C 1 30 (SUTURE) IMPLANT
SUT SILK  1 MH (SUTURE) ×2
SUT SILK 1 MH (SUTURE) IMPLANT
SUT STEEL 6MS V (SUTURE) IMPLANT
SUT STEEL STERNAL CCS#1 18IN (SUTURE) IMPLANT
SUT STEEL SZ 6 DBL 3X14 BALL (SUTURE) IMPLANT
SUT VIC AB 1 CTX 36 (SUTURE) ×6
SUT VIC AB 1 CTX36XBRD ANBCTR (SUTURE) ×4 IMPLANT
SYSTEM SAHARA CHEST DRAIN ATS (WOUND CARE) ×2 IMPLANT
TAPE CLOTH SURG 4X10 WHT LF (GAUZE/BANDAGES/DRESSINGS) IMPLANT
TAPE PAPER 2X10 WHT MICROPORE (GAUZE/BANDAGES/DRESSINGS) IMPLANT
TOWEL GREEN STERILE (TOWEL DISPOSABLE) ×2 IMPLANT
TOWEL GREEN STERILE FF (TOWEL DISPOSABLE) ×2 IMPLANT
TRAY FOLEY SLVR 16FR TEMP STAT (SET/KITS/TRAYS/PACK) ×2 IMPLANT
TUBE SUCTION CARDIAC 10FR (CANNULA) IMPLANT
UNDERPAD 30X36 HEAVY ABSORB (UNDERPADS AND DIAPERS) ×2 IMPLANT
VALVE AORTIC SZ25 INSP/RESIL (Prosthesis & Implant Heart) IMPLANT
VENT LEFT HEART 12002 (CATHETERS) ×2
WATER STERILE IRR 1000ML POUR (IV SOLUTION) ×4 IMPLANT

## 2022-01-13 NOTE — Interval H&P Note (Signed)
History and Physical Interval Note:  01/13/2022 7:05 AM  Reginald Myers  has presented today for surgery, with the diagnosis of AS AI.  The various methods of treatment have been discussed with the patient and family. After consideration of risks, benefits and other options for treatment, the patient has consented to  Procedure(s): REDO AORTIC VALVE REPLACEMENT (AVR) (N/A) TRANSESOPHAGEAL ECHOCARDIOGRAM (TEE) (N/A) as a surgical intervention.  The patient's history has been reviewed, patient examined, no change in status, stable for surgery.  I have reviewed the patient's chart and labs.  Questions were answered to the patient's satisfaction.     Gaye Pollack

## 2022-01-13 NOTE — Discharge Instructions (Signed)

## 2022-01-13 NOTE — Brief Op Note (Signed)
01/13/2022  11:55 AM  PATIENT:  Reginald Myers  66 y.o. male  PRE-OPERATIVE DIAGNOSIS:  1. Moderate prosthetic aortic stenosis 2. Severe aortic insufficiency  POST-OPERATIVE DIAGNOSIS:  1. Moderate prosthetic aortic stenosis 2. Severe aortic insufficiency  PROCEDURE:  TRANSESOPHAGEAL ECHOCARDIOGRAM (TEE), REDO AORTIC VALVE REPLACEMENT USING ( EDWARDS INSPIRIS RESILIA AORTIC VALVE Model# [id number], Serial # [id number],VZDG 25 MM)  SURGEON:  Surgeon(s) and Role:    Gaye Pollack, MD - Primary  PHYSICIAN ASSISTANT: Lars Pinks PA-C  ASSISTANTS: Dineen Kid RNFA   ANESTHESIA:   general  EBL:  Per anesthesia, perfusion records  DRAINS:  Chest tubes placed in the mediastinal and pleural spaces    SPECIMEN:  Source of Specimen:  Bioprosthetic aortic valve  DISPOSITION OF SPECIMEN:  PATHOLOGY  COUNTS CORRECT:  YES  DICTATION: .Dragon Dictation  PLAN OF CARE: Admit to inpatient   PATIENT DISPOSITION:  ICU - intubated and hemodynamically stable.   Delay start of Pharmacological VTE agent (>24hrs) due to surgical blood loss or risk of bleeding: no   BASELINE WEIGHT: 93.4 kg

## 2022-01-13 NOTE — Op Note (Signed)
CARDIOVASCULAR SURGERY OPERATIVE NOTE  01/13/2022 Reginald Myers [id number]  Surgeon:  Reginald Pollack, MD  First Assistant: Reginald Pinks,  PA-C : An experienced assistant was required given the complexity of this surgery and the standard of surgical care. The assistant was needed for exposure, dissection, suctioning, retraction of delicate tissues and sutures, instrument exchange and for overall help during this procedure.    Preoperative Diagnosis:  Severe prosthetic aortic valve stenosis   Postoperative Diagnosis:  Same   Procedure:  Redo Median Sternotomy Extracorporeal circulation 3.   Redo Aortic valve replacement using a 25 mm Edwards INSPIRIS RESILIA valve.  Anesthesia:  General Endotracheal   Clinical History/Surgical Indication:  This gentleman has stage C, asymptomatic, 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  He also had mitral valve annuloplasty and Maze procedure at that time.  His most recent echocardiogram shows a further increase in the left ventricular diastolic dimension to 5.5 cm.  He has trivial MR and has been maintaining sinus rhythm.  I think it would be best to proceed with redo aortic valve replacement to prevent further left ventricular dilatation and dysfunction.  I will plan to use an INSPIRIS RESILIA pericardial valve which should have a longer life span for him.  I discussed the use of a mechanical valve but I think the risk of anticoagulation and emboli is greater than the benefit in his age group.  I reviewed the echocardiogram images with the patient and his family and answered their questions.  He is inclined to proceed with surgery.  His gated coronary CTA in June 2022 showed mild disease in the LAD and minimal disease in the RCA and left circumflex. I discussed the operative procedure with the patient and wife including alternatives,  benefits and risks; including but not limited to bleeding, blood transfusion, infection, stroke, myocardial infarction, graft failure, heart block requiring a permanent pacemaker, organ dysfunction, and death.  Reginald Myers understands and agrees to proceed.      Preparation:  The patient was seen in the preoperative holding area and the correct patient, correct operation were confirmed with the patient after reviewing the medical record and catheterization. The consent was signed by me. Preoperative antibiotics were given. A pulmonary arterial line and radial arterial line were placed by the anesthesia team. The patient was taken back to the operating room and positioned supine on the operating room table. After being placed under general endotracheal anesthesia by the anesthesia team a foley catheter was placed. The neck, chest, abdomen, and both legs were prepped with betadine soap and solution and draped in the usual sterile manner. A surgical time-out was taken and the correct patient and operative procedure were confirmed with the nursing and anesthesia staff.   Pre-bypass TEE:   Complete TEE assessment was performed by Dr. Gifford Myers. This showed severe prosthetic aortic valve regurgitation, moderate AS, trivial MR. LVEF normal.    Post-bypass TEE:   Normal functioning prosthetic aortic valve with no perivalvular leak or regurgitation through the valve. Left ventricular function preserved. Unchanged trivial mitral regurgitation.    Cardiopulmonary Bypass:  A redo median sternotomy was performed using the oscillating saw after removing the sternal wires. Bone hooks were used to retract the sternal edges and the heart was dissected from the posterior sternum. Right ventricular function appeared normal. Dissection was performed to expose the right atrium and ascending aorta. The ascending aorta  was of normal size and had no palpable plaque. There were no contraindications to aortic  cannulation or cross-clamping. The patient was fully systemically heparinized and the ACT was maintained > 400 sec. The proximal aortic arch was cannulated with a 20 F aortic cannula for arterial inflow. Venous cannulation was performed via the right atrial appendage using a two-staged venous cannula. An antegrade cardioplegia/vent cannula was inserted into the mid-ascending aorta. A left ventricular vent was placed via the right superior pulmonary vein. A retrograde cardioplegia cannnula was placed into the coronary sinus via the right atrium. Aortic occlusion was performed with a single cross-clamp. Systemic cooling to 32 degrees Centigrade and topical cooling of the heart with iced saline were used. 1400 cc of cold KBC retrograde cardioplegia was used to induce diastolic arrest and then cold KBC  retrograde cardioplegia was given at about 60 minute intervals throughout the period of arrest to maintain myocardial temperature at or below 10 degrees centigrade. This took one more dose. A temperature probe was inserted into the interventricular septum and an insulating pad was placed in the pericardium. Carbon dioxide was insufflated into the pericardium at 5L/min throughout the procedure to minimize intracardiac air.   Redo Aortic Valve Replacement:   A transverse aortotomy was performed through the previous aortotomy site. The prosthetic valve was thickened and the non-coronary leaflet had a perforation in the center of the leaflet and a split in the free edge preventing coaptation. There were no signs of vegetation. The ostia of the coronary arteries were in normal position and were not obstructed. The prosthetic valve was excised. Care was taken to remove all particulate debris. The left ventricle was directly inspected for debris and then irrigated with ice saline solution. The annulus was sized and a size 25 mm Edwards INSPIRIS RESILIA pericardial valve was chosen. The model number was [id number] and the  serial number was [id number]. While the valve was being prepared 2-0 Ethibond pledgeted horizontal mattress sutures were placed around the annulus with the pledgets in a sub-annular position. The sutures were placed through the sewing ring and the valve lowered into place. The sutures were tied using CorKnots. The valve seated nicely and the coronary ostia were not obstructed. The prosthetic valve leaflets moved normally and there was no sub-valvular obstruction. The aortotomy was closed using 4-0 Prolene suture in 2 layers with felt strips to reinforce the closure.  Completion:  The patient was rewarmed to 37 degrees Centigrade. De-airing maneuvers were performed and the head placed in trendelenburg position. The crossclamp was removed with a time of 113 minutes. There was spontaneous return of junctional rhythm in the 60's The aortotomy was checked for hemostasis. Two temporary epicardial pacing wires were placed on the right atrium and a bipolar ventricular pacing lead on the inferior wall. The left ventricular vent and retrograde cardioplegia cannulas were removed. The patient was weaned from CPB without difficulty on no inotropes. CPB time was 161 minutes. Cardiac output was 5 LPM. Heparin was fully reversed with protamine and the aortic and venous cannulas removed. Hemostasis was achieved. Mediastinal and right drainage tubes were placed. The sternum was closed with double #6 stainless steel wires. The fascia was closed with continuous # 1 vicryl suture. The subcutaneous tissue was closed with 2-0 vicryl continuous suture. The skin was closed with 3-0 vicryl subcuticular suture. All sponge, needle, and instrument counts were reported correct at the end of the case. Dry sterile dressings were placed over the incisions and around the chest tubes  which were connected to pleurevac suction. The patient was then transported to the cardiac surgical intensive care unit in stable condition.

## 2022-01-13 NOTE — Anesthesia Procedure Notes (Signed)
Procedure Name: Intubation Date/Time: 01/13/2022 7:47 AM  Performed by: Heide Scales, CRNAPre-anesthesia Checklist: Patient identified, Emergency Drugs available, Suction available and Patient being monitored Patient Re-evaluated:Patient Re-evaluated prior to induction Oxygen Delivery Method: Circle system utilized Preoxygenation: Pre-oxygenation with 100% oxygen Induction Type: IV induction Ventilation: Mask ventilation without difficulty Laryngoscope Size: Mac and 4 Grade View: Grade I Tube type: Oral Tube size: 8.0 mm Number of attempts: 1 Airway Equipment and Method: Stylet and Oral airway Placement Confirmation: ETT inserted through vocal cords under direct vision, positive ETCO2 and breath sounds checked- equal and bilateral Secured at: 22 cm Tube secured with: Tape Dental Injury: Teeth and Oropharynx as per pre-operative assessment

## 2022-01-13 NOTE — Procedures (Signed)
Extubation Procedure Note  Patient Details:   Name: Mahdi Frye DOB: Oct 10, 1955 MRN: [id number]   Airway Documentation:  Airway (Active)   Vent end date: 01/13/22 Vent end time: 1610   Evaluation  O2 sats: stable throughout Complications: No apparent complications Patient did tolerate procedure well. Bilateral Breath Sounds: Clear, Diminished   Yes, pt could speak post extubation. Pt extubated to 4 l/m [place] per protocol.  Earney Navy 01/13/2022, 4:11 PM

## 2022-01-13 NOTE — TOC Initial Note (Signed)
Transition of Care [facility]) - Initial/Assessment Note    Patient Details  Name: Reginald Myers MRN: [id number] Date of Birth: [birth year]  [facility] of [facility] CM/SW Contact:    Verdell Carmine, RN Phone Number: 01/13/2022, 4:14 PM  Clinical Narrative:                  66 Yo for elective AVR Previously had AVR done in 2014 with MAZE  and has followed with Dr [name]. Had little to no symptoms, TEE revealed Aortic stenosis. Here for redo AVR. Lives at home with spouse in [place]. May need HH post op however patient was independent previous to surgery.   CM will follow for needs, recommendations, and transitions of care   Barriers to Discharge: Continued Medical Work up   Patient Goals and CMS Choice        Expected Discharge Plan and Services         Living arrangements for the past 2 months: Single Family Home                                      Prior Living Arrangements/Services Living arrangements for the past 2 months: Single Family Home Lives with:: Spouse Patient language and need for interpreter reviewed:: Yes        Need for Family Participation in Patient Care: Yes (Comment) Care giver support system in place?: Yes (comment)   Criminal Activity/Legal Involvement Pertinent to Current Situation/Hospitalization: No - Comment as needed  Activities of Daily Living      Permission Sought/Granted                  Emotional Assessment   Attitude/Demeanor/Rapport: Intubated (Following Commands or Not Following Commands)   Orientation: : Fluctuating Orientation (Suspected and/or reported Sundowners)   Psych Involvement: No (comment)  Admission diagnosis:  Severe aortic regurgitation [I35.1] Patient Active Problem List   Diagnosis Date Noted   S/P aortic valve replacement with bioprosthetic valve 01/13/2022   Severe aortic regurgitation 01/13/2022   S/P AVR 09/11/2012   S/P MVR (mitral valve repair) 09/11/2012   S/P Maze operation  for atrial fibrillation 09/11/2012   Shortness of breath    Acute systolic heart failure ([place])    Atrial fibrillation ([place])    Aortic valve disorder    PCP:  Leeroy Cha, MD Pharmacy:   [facility] [place], [place] - [address] [place] [address] [place] [address] [place] [address] Phone: [phone] Fax: [place] [address], [place] [address] Phone: [phone] Fax: [phone]     Social Determinants of Health (SDOH) Interventions    Readmission Risk Interventions     No data to display

## 2022-01-13 NOTE — Hospital Course (Addendum)
HPI:  The patient is a 66 year old gentleman with a history of hypertension, hyperlipidemia, atrial fibrillation, who underwent aortic valve replacement by me in August 2014 using a 25 mm Magna-ease pericardial valve with mitral annuloplasty using a 30 mm ring and biatrial MAZE IV procedure with ligation of the left atrial appendage.  He has done well over the years and has been followed by Dr. [name].  A 2D echo in April 2022 was felt to show mild to moderate aortic insufficiency with a pressure half-time of 475 ms.  The mean gradient was 33 mmHg with a peak gradient of 69 mmHg.  Aortic valve area was measured at 0.6 cm.  Left ventricular ejection fraction was 60 to 65%.  He subsequently underwent a gated cardiac CTA on 07/09/2020 which showed restricted movement of the leaflets in systole with thickening and calcification.  The leaflet of the RCC was retracted with incomplete leaflet coaptation which was felt to be the source of central regurgitation.  There was no evidence of paravalvular leak.  The aortic root was measured at 4.3 cm which was unchanged from previously.  Coronary calcium score was 471 with mild disease in the LAD.  A TEE was done on 02/03/2021 which showed some thickening of the aortic valve leaflets.  There was mild restriction in systole.  There was no evidence of vegetation.  There was concern for diastolic flow reversal in the descending aorta.  The mean gradient across the aortic valve was 28 mmHg with what was felt to be moderate to severe aortic insufficiency related to valve deterioration.  Left ventricular ejection fraction and cavity size were normal.  I initially saw him on 08/18/2021 and he was feeling well and exercising regularly without any symptoms.  His AI pressure half-time was 619 ms and left ventricular systolic function was normal with normal LV internal dimensions.  I felt that it was reasonable to continue following this since he was asymptomatic with no signs of cardiac  function deterioration.  A follow-up echocardiogram on 11/23/2021 continues to show moderate to severe aortic insufficiency with pressure half-time recorded at 1149 ms.  The mean gradient across the prosthetic aortic valve was 31 mmHg with a peak gradient of 60 mmHg suggesting at least moderate prosthetic aortic stenosis.  Left ventricular ejection fraction remains 55 to 60%.      He denies any chest pain or pressure.  He has had no shortness of breath or exertional fatigue.  He denies orthopnea and PND.  He has had no peripheral edema.  He denies any dizziness or syncope.  He continues to exercise regularly.  Dr. Cyndia Bent discussed proceeding with redo aortic valve replacement to prevent further LV dilatation and dysfunction. Potential risks, benefits, and complications of the surgery were discussed with the patient and he agreed to proceed with surgery.  Hospital Course: Patient underwent a redo sternotomy for redo aortic valve replacement (bioprosthetic). He was transferred from the OR to [facility] ICU in stable condition.  The patient was extubated the evening of surgery.  He was volume overloaded and started Lasix.  He had post operative thrombocytopenia and lovenox therapy was not initiated.  The patient's chest tubes and arterial lines were removed without difficulty.  His post operative rhythm was narrow complex junctional.  He was not started on BB therapy and back up pacing was continued.

## 2022-01-13 NOTE — Anesthesia Procedure Notes (Signed)
Central Venous Catheter Insertion Performed by: Santa Lighter, MD, anesthesiologist Start/End12/08/2021 7:10 AM, 01/13/2022 7:15 AM Patient location: Pre-op. Preanesthetic checklist: patient identified, IV checked, site marked, risks and benefits discussed, surgical consent, monitors and equipment checked, pre-op evaluation and timeout performed Position: Trendelenburg Hand hygiene performed  and maximum sterile barriers used  Total catheter length 100. PA cath was placed.Swan type:thermodilution PA Cath depth:65 Procedure performed without using ultrasound guided technique. Attempts: 1 Patient tolerated the procedure well with no immediate complications.

## 2022-01-13 NOTE — Progress Notes (Signed)
     [address]       [place],Reginald [place] [id number]             [phone]       EVENING ROUNDS  POD #0 Redo AVR Just extubated Good hemodynamics Will give amp of Bicarb for post extubation ABG Give last albumin for low filling pressures Not bleeding

## 2022-01-13 NOTE — Transfer of Care (Signed)
Immediate Anesthesia Transfer of Care Note  Patient: Reginald Myers  Procedure(s) Performed: REDO AORTIC VALVE REPLACEMENT USING 25 MM EDWARDS INSPIRIS RESILIA AORTIC VALVE (Chest) TRANSESOPHAGEAL ECHOCARDIOGRAM (TEE)  Patient Location: ICU  Anesthesia Type:General  Level of Consciousness: Patient remains intubated per anesthesia plan  Airway & Oxygen Therapy: Patient remains intubated per anesthesia plan and Patient placed on Ventilator (see vital sign flow sheet for setting)  Post-op Assessment: Report given to RN and Post -op Vital signs reviewed and stable  Post vital signs: Reviewed and stable  Last Vitals:  Vitals Value Taken Time  BP 113/62   Temp    Pulse 80   Resp 12 01/13/22 1330  SpO2 98 % 01/13/22 1330  Vitals shown include unvalidated device data.  Last Pain:  Vitals:   01/13/22 0642  TempSrc:   PainSc: 0-No pain         Complications: No notable events documented.

## 2022-01-13 NOTE — Anesthesia Procedure Notes (Signed)
Central Venous Catheter Insertion Performed by: Santa Lighter, MD, anesthesiologist Start/End12/08/2021 7:00 AM, 01/13/2022 7:10 AM Patient location: Pre-op. Preanesthetic checklist: patient identified, IV checked, site marked, risks and benefits discussed, surgical consent, monitors and equipment checked, pre-op evaluation, timeout performed and anesthesia consent Position: Trendelenburg Lidocaine 1% used for infiltration and patient sedated Hand hygiene performed , maximum sterile barriers used  and Seldinger technique used Catheter size: 9 Fr Central line was placed.MAC introducer Procedure performed using ultrasound guided technique. Ultrasound Notes:anatomy identified, needle tip was noted to be adjacent to the nerve/plexus identified, no ultrasound evidence of intravascular and/or intraneural injection and image(s) printed for medical record Attempts: 1 Following insertion, line sutured, dressing applied and Biopatch. Post procedure assessment: free fluid flow, blood return through all ports and no air  Patient tolerated the procedure well with no immediate complications.

## 2022-01-13 NOTE — Anesthesia Procedure Notes (Signed)
Arterial Line Insertion Start/End12/08/2021 7:02 AM, 01/13/2022 7:05 AM Performed by: Heide Scales, CRNA, CRNA  Patient location: Pre-op. Preanesthetic checklist: patient identified, IV checked, site marked, risks and benefits discussed, surgical consent, monitors and equipment checked, pre-op evaluation, timeout performed and anesthesia consent Lidocaine 1% used for infiltration Left, radial was placed Catheter size: 20 G Hand hygiene performed , maximum sterile barriers used  and Seldinger technique used Allen's test indicative of satisfactory collateral circulation Attempts: 1 Procedure performed without using ultrasound guided technique. Following insertion, dressing applied and Biopatch. Post procedure assessment: normal and unchanged  Patient tolerated the procedure well with no immediate complications.

## 2022-01-13 NOTE — Plan of Care (Signed)
  Problem: Clinical Measurements: Goal: Will remain free from infection Outcome: Progressing Goal: Diagnostic test results will improve Outcome: Progressing Goal: Respiratory complications will improve Outcome: Progressing Goal: Cardiovascular complication will be avoided Outcome: Progressing   Problem: Coping: Goal: Level of anxiety will decrease Outcome: Progressing   Problem: Pain Managment: Goal: General experience of comfort will improve Outcome: Progressing   Problem: Cardiac: Goal: Will achieve and/or maintain hemodynamic stability Outcome: Progressing   Problem: Clinical Measurements: Goal: Postoperative complications will be avoided or minimized Outcome: Progressing   Problem: Respiratory: Goal: Respiratory status will improve Outcome: Progressing

## 2022-01-13 NOTE — Discharge Summary (Signed)
[address]       [place] [address]             [phone]    Physician Discharge Summary  Patient ID: Reginald Myers MRN: [id number] DOB/AGE: 1955-11-26 66 y.o.  Admit date: 01/13/2022 Discharge date: 01/14/2022  Admission Diagnoses:  Patient Active Problem List   Diagnosis Date Noted   Severe aortic regurgitation 01/13/2022   S/P AVR 09/11/2012   S/P MVR (mitral valve repair) 09/11/2012   S/P Maze operation for atrial fibrillation 09/11/2012   Shortness of breath    Acute systolic heart failure ([place])    Atrial fibrillation ([place])    Aortic valve disorder    Discharge Diagnoses:  Patient Active Problem List   Diagnosis Date Noted   S/P aortic valve replacement with bioprosthetic valve 01/13/2022   Severe aortic regurgitation 01/13/2022   S/P AVR 09/11/2012   S/P MVR (mitral valve repair) 09/11/2012   S/P Maze operation for atrial fibrillation 09/11/2012   Shortness of breath    Acute systolic heart failure ([place])    Atrial fibrillation ([place])    Aortic valve disorder    Discharged Condition: {condition:18240}  HPI:  The patient is a 66 year old gentleman with a history of hypertension, hyperlipidemia, atrial fibrillation, who underwent aortic valve replacement by me in August 2014 using a 25 mm Magna-ease pericardial valve with mitral annuloplasty using a 30 mm ring and biatrial MAZE IV procedure with ligation of the left atrial appendage.  He has done well over the years and has been followed by Dr. [name].  A 2D echo in April 2022 was felt to show mild to moderate aortic insufficiency with a pressure half-time of 475 ms.  The mean gradient was 33 mmHg with a peak gradient of 69 mmHg.  Aortic valve area was measured at 0.6 cm.  Left ventricular ejection fraction was 60 to 65%.  He subsequently underwent a gated cardiac CTA on 07/09/2020 which showed restricted movement of the leaflets in systole with thickening and calcification.  The leaflet of  the RCC was retracted with incomplete leaflet coaptation which was felt to be the source of central regurgitation.  There was no evidence of paravalvular leak.  The aortic root was measured at 4.3 cm which was unchanged from previously.  Coronary calcium score was 471 with mild disease in the LAD.  A TEE was done on 02/03/2021 which showed some thickening of the aortic valve leaflets.  There was mild restriction in systole.  There was no evidence of vegetation.  There was concern for diastolic flow reversal in the descending aorta.  The mean gradient across the aortic valve was 28 mmHg with what was felt to be moderate to severe aortic insufficiency related to valve deterioration.  Left ventricular ejection fraction and cavity size were normal.  I initially saw him on 08/18/2021 and he was feeling well and exercising regularly without any symptoms.  His AI pressure half-time was 619 ms and left ventricular systolic function was normal with normal LV internal dimensions.  I felt that it was reasonable to continue following this since he was asymptomatic with no signs of cardiac function deterioration.  A follow-up echocardiogram on 11/23/2021 continues to show moderate to severe aortic insufficiency with pressure half-time recorded at 1149 ms.  The mean gradient across the prosthetic aortic valve was 31 mmHg with a peak gradient of 60 mmHg suggesting at least moderate prosthetic aortic stenosis.  Left ventricular ejection fraction  remains 55 to 60%.      He denies any chest pain or pressure.  He has had no shortness of breath or exertional fatigue.  He denies orthopnea and PND.  He has had no peripheral edema.  He denies any dizziness or syncope.  He continues to exercise regularly.  Dr. Cyndia Bent discussed proceeding with redo aortic valve replacement to prevent further LV dilatation and dysfunction. Potential risks, benefits, and complications of the surgery were discussed with the patient and he agreed to proceed  with surgery.  Hospital Course: Patient underwent a redo sternotomy for redo aortic valve replacement (bioprosthetic). He was transferred from the OR to [facility] ICU in stable condition.  The patient was extubated the evening of surgery.  He was volume overloaded and started Lasix.  He had post operative thrombocytopenia and lovenox therapy was not initiated.  The patient's chest tubes and arterial lines were removed without difficulty.  His post operative rhythm was narrow complex junctional.  He was not started on BB therapy and back up pacing was continued.   Consults: {consultation:18241}  Significant Diagnostic Studies: cardiac graphics:  ECHOCARDIOGRAM  Indications:    Z95.5 S/p AVR, MVR    History:        Patient has prior history of Echocardiogram examinations,  most                 recent 05/20/2020. Signs/Symptoms:Murmur and Shortness of  Breath;                 Risk Factors:Hypertension and HLD. Maze procedure  09/07/2012.                 Aortic Valve: 25 mm pericardial valve is present in the  aortic                 position. Procedure Date: 09/07/2012.                  Mitral Valve: 30 mm prosthetic annuloplasty ring valve is                  present in the mitral position. Procedure Date:  09/07/2012.    Sonographer:   Marygrace Drought RCS  Referring Phys: [place]     1. Degenerative tissue bioprosthetic AVR Overall findings similar to TEE  done 05/20/20.   2. Left ventricular ejection fraction, by estimation, is 55 to 60%. The  left ventricle has normal function. The left ventricle has no regional  wall motion abnormalities. Left ventricular diastolic parameters are  indeterminate.   3. Right ventricular systolic function is normal. The right ventricular  size is normal. There is normal pulmonary artery systolic pressure.   4. Right atrial size was mildly dilated.   5. Post repair with 30 mm Sorin ring trivial residual MR with mean  diastolic  gradient 7 mmhg No MS . The mitral valve has been  repaired/replaced. Trivial mitral valve regurgitation. There is a 30 mm  prosthetic annuloplasty ring present in the mitral  position. Procedure Date: 09/07/2012.   6. Tricuspid valve regurgitation is mild to moderate.   7. 2014 post AVR with 25 mm pericardial Magna Ease valve Leaflets are  thickened and calcified moderate AS and moderate to severe central AR .  The aortic valve has been repaired/replaced. Aortic valve regurgitation is  moderate to severe. Moderate aortic  valve stenosis. There is a 25 mm pericardial valve present in the aortic  position. Procedure  Date: 09/07/2012.    Treatments: surgery:   01/13/2022 Christon Parada [id number]   Surgeon:  Gaye Pollack, MD   First Assistant: Lars Pinks,  PA-C : An experienced assistant was required given the complexity of this surgery and the standard of surgical care. The assistant was needed for exposure, dissection, suctioning, retraction of delicate tissues and sutures, instrument exchange and for overall help during this procedure.      Preoperative Diagnosis:  Severe prosthetic aortic valve stenosis     Postoperative Diagnosis:  Same     Procedure:   Redo Median Sternotomy Extracorporeal circulation 3.   Redo Aortic valve replacement using a 25 mm Edwards INSPIRIS RESILIA valve.   Discharge Exam: Blood pressure 121/83, pulse 62, temperature 99 F (37.2 C), resp. rate 16, height '6\' 1"'$  (1.854 m), weight 96.9 kg, SpO2 97 %. {physical [id number]   Discharge Medications:  The patient has been discharged on:   1.Beta Blocker:  Yes [   ]                              No   [   ]                              If No, reason:  2.Ace Inhibitor/ARB: Yes [   ]                                     No  [    ]                                     If No, reason:  3.Statin:   Yes [   ]                  No  [   ]                  If No, reason:  4.Ecasa:  Yes   [   ]                  No   [   ]                  If No, reason:  Patient had ACS upon admission:  Plavix/P2Y12 inhibitor: Yes [   ]                                      No  [   ]      Allergies as of 01/14/2022       Reactions   Amiodarone Other (See Comments)   Intolerance due to nausea Other reaction(s): upset stomach   Loperamide-simethicone    Other reaction(s): Not available   Statins Other (See Comments)   Muscle aches  - currently taking a Statin drug Other reaction(s): not tolerated     Med Rec must be completed prior to using this [facility]***       Follow-up Information     [name], MD Follow up.   Specialties: Cardiology, Radiology, Interventional Cardiology Contact information: [address]  [address] [place] [address] [phone]         Gaye Pollack, MD Follow up.   Specialty: Cardiothoracic Surgery Why: PA/LAT CXR to be taken (at [facility] which is in the same building as Dr. Vivi Martens office) on at;Appointment time is at Contact information: [place] [address] [phone]         [facility] Follow up.   Specialty: Cardiothoracic Surgery Why: Appointment is with nurse only for chest tube suture removal. Appointment time is at [facility] information: [place] [place] [facility] Follow up.   Specialty: Cardiology Contact information: [address] [id number] [place] [phone]                Signed:  Ellwood Handler, PA-C  01/14/2022, 10:00 AM

## 2022-01-14 ENCOUNTER — Encounter (HOSPITAL_COMMUNITY): Payer: Self-pay | Admitting: Surgery

## 2022-01-14 ENCOUNTER — Inpatient Hospital Stay (HOSPITAL_COMMUNITY): Payer: BC Managed Care – PPO

## 2022-01-14 ENCOUNTER — Other Ambulatory Visit: Payer: Self-pay | Admitting: Cardiology

## 2022-01-14 DIAGNOSIS — I350 Nonrheumatic aortic (valve) stenosis: Secondary | ICD-10-CM

## 2022-01-14 LAB — CBC
HCT: 33.9 % — ABNORMAL LOW (ref 39.0–52.0)
HCT: 35.9 % — ABNORMAL LOW (ref 39.0–52.0)
Hemoglobin: 11.5 g/dL — ABNORMAL LOW (ref 13.0–17.0)
Hemoglobin: 12.4 g/dL — ABNORMAL LOW (ref 13.0–17.0)
MCH: 30.6 pg (ref 26.0–34.0)
MCH: 30.7 pg (ref 26.0–34.0)
MCHC: 33.9 g/dL (ref 30.0–36.0)
MCHC: 34.5 g/dL (ref 30.0–36.0)
MCV: 90.2 fL (ref 80.0–100.0)
MCV: 88.9 fL (ref 80.0–100.0)
Platelets: 92 10*3/uL — ABNORMAL LOW (ref 150–400)
Platelets: 106 10*3/uL — ABNORMAL LOW (ref 150–400)
RBC: 3.76 MIL/uL — ABNORMAL LOW (ref 4.22–5.81)
RBC: 4.04 MIL/uL — ABNORMAL LOW (ref 4.22–5.81)
RDW: 13.2 % (ref 11.5–15.5)
RDW: 13.4 % (ref 11.5–15.5)
WBC: 13.5 10*3/uL — ABNORMAL HIGH (ref 4.0–10.5)
WBC: 16.6 10*3/uL — ABNORMAL HIGH (ref 4.0–10.5)
nRBC: 0 % (ref 0.0–0.2)
nRBC: 0 % (ref 0.0–0.2)

## 2022-01-14 LAB — BASIC METABOLIC PANEL
Anion gap: 8 (ref 5–15)
Anion gap: 8 (ref 5–15)
BUN: 20 mg/dL (ref 8–23)
BUN: 21 mg/dL (ref 8–23)
CO2: 22 mmol/L (ref 22–32)
CO2: 24 mmol/L (ref 22–32)
Calcium: 8.1 mg/dL — ABNORMAL LOW (ref 8.9–10.3)
Calcium: 8.6 mg/dL — ABNORMAL LOW (ref 8.9–10.3)
Chloride: 106 mmol/L (ref 98–111)
Chloride: 103 mmol/L (ref 98–111)
Creatinine, Ser: 1.25 mg/dL — ABNORMAL HIGH (ref 0.61–1.24)
Creatinine, Ser: 1.43 mg/dL — ABNORMAL HIGH (ref 0.61–1.24)
GFR, Estimated: >60 mL/min (ref >60)
GFR, Estimated: 55 mL/min — ABNORMAL LOW (ref >60)
Glucose, Bld: 114 mg/dL — ABNORMAL HIGH (ref 70–99)
Glucose, Bld: 145 mg/dL — ABNORMAL HIGH (ref 70–99)
Potassium: 4 mmol/L (ref 3.5–5.1)
Potassium: 3.6 mmol/L (ref 3.5–5.1)
Sodium: 136 mmol/L (ref 135–145)
Sodium: 135 mmol/L (ref 135–145)

## 2022-01-14 LAB — ECHO INTRAOPERATIVE TEE
AV Mean grad: 6 mmHg
AV Peak grad: 10.6 mmHg
Ao pk vel: 1.63 m/s
Height: 73 in
Weight: 3296 oz

## 2022-01-14 LAB — GLUCOSE, CAPILLARY
Glucose-Capillary: 100 mg/dL — ABNORMAL HIGH (ref 70–99)
Glucose-Capillary: 108 mg/dL — ABNORMAL HIGH (ref 70–99)
Glucose-Capillary: 117 mg/dL — ABNORMAL HIGH (ref 70–99)
Glucose-Capillary: 118 mg/dL — ABNORMAL HIGH (ref 70–99)
Glucose-Capillary: 118 mg/dL — ABNORMAL HIGH (ref 70–99)
Glucose-Capillary: 124 mg/dL — ABNORMAL HIGH (ref 70–99)
Glucose-Capillary: 124 mg/dL — ABNORMAL HIGH (ref 70–99)
Glucose-Capillary: 124 mg/dL — ABNORMAL HIGH (ref 70–99)
Glucose-Capillary: 127 mg/dL — ABNORMAL HIGH (ref 70–99)
Glucose-Capillary: 139 mg/dL — ABNORMAL HIGH (ref 70–99)
Glucose-Capillary: 140 mg/dL — ABNORMAL HIGH (ref 70–99)
Glucose-Capillary: 148 mg/dL — ABNORMAL HIGH (ref 70–99)

## 2022-01-14 LAB — MAGNESIUM
Magnesium: 2.6 mg/dL — ABNORMAL HIGH (ref 1.7–2.4)
Magnesium: 2.4 mg/dL (ref 1.7–2.4)

## 2022-01-14 LAB — SURGICAL PATHOLOGY

## 2022-01-14 MED ORDER — POTASSIUM CHLORIDE CRYS ER 20 MEQ PO TBCR
20.0000 meq | EXTENDED_RELEASE_TABLET | Freq: Once | ORAL | Status: AC
  Administered 2022-01-14: 20 meq via ORAL
  Filled 2022-01-14: qty 1

## 2022-01-14 MED ORDER — INSULIN ASPART 100 UNIT/ML IJ SOLN
0.0000 [IU] | INTRAMUSCULAR | Status: DC
  Administered 2022-01-14 – 2022-01-15 (×4): 2 [IU] via SUBCUTANEOUS

## 2022-01-14 MED ORDER — POTASSIUM CHLORIDE CRYS ER 20 MEQ PO TBCR
20.0000 meq | EXTENDED_RELEASE_TABLET | Freq: Two times a day (BID) | ORAL | Status: AC
  Administered 2022-01-15 (×2): 20 meq via ORAL
  Filled 2022-01-14 (×2): qty 1

## 2022-01-14 MED ORDER — FUROSEMIDE 10 MG/ML IJ SOLN
40.0000 mg | Freq: Once | INTRAMUSCULAR | Status: AC
  Administered 2022-01-15: 40 mg via INTRAVENOUS
  Filled 2022-01-14: qty 4

## 2022-01-14 MED ORDER — FUROSEMIDE 10 MG/ML IJ SOLN
40.0000 mg | Freq: Once | INTRAMUSCULAR | Status: AC
  Administered 2022-01-14: 40 mg via INTRAVENOUS
  Filled 2022-01-14: qty 4

## 2022-01-14 MED FILL — Thrombin (Recombinant) For Soln 20000 Unit: CUTANEOUS | Qty: 1 | Status: AC

## 2022-01-14 NOTE — Anesthesia Postprocedure Evaluation (Signed)
Anesthesia Post Note  Patient: Reginald Myers  Procedure(s) Performed: REDO AORTIC VALVE REPLACEMENT USING 25 MM EDWARDS INSPIRIS RESILIA AORTIC VALVE (Chest) TRANSESOPHAGEAL ECHOCARDIOGRAM (TEE)     Patient location during evaluation: SICU Anesthesia Type: General Level of consciousness: sedated Pain management: pain level controlled Vital Signs Assessment: post-procedure vital signs reviewed and stable Respiratory status: patient remains intubated per anesthesia plan Cardiovascular status: stable Postop Assessment: no apparent nausea or vomiting Anesthetic complications: no   No notable events documented.  Last Vitals:  Vitals:   01/14/22 1100 01/14/22 1200  BP: 114/72 122/72  Pulse:    Resp: 16 16  Temp:    SpO2: 97% 98%    Last Pain:  Vitals:   01/14/22 1200  TempSrc:   PainSc: 0-No pain                 Santa Lighter

## 2022-01-14 NOTE — Progress Notes (Signed)
1 Day Post-Op Procedure(s) (LRB): REDO AORTIC VALVE REPLACEMENT USING 25 MM EDWARDS INSPIRIS RESILIA AORTIC VALVE (N/A) TRANSESOPHAGEAL ECHOCARDIOGRAM (TEE) (N/A) Subjective: Sore but otherwise feels well.  Objective: Vital signs in last 24 hours: Temp:  [97.5 F (36.4 C)-99.1 F (37.3 C)] 99 F (37.2 C) (12/08 0700) Cardiac Rhythm: A-V Sequential paced (12/08 0400) Resp:  [12-23] 17 (12/08 0700) BP: (90-125)/(66-91) 118/76 (12/08 0700) SpO2:  [96 %-100 %] 98 % (12/08 0700) Arterial Line BP: (84-135)/(58-81) 129/60 (12/08 0700) FiO2 (%):  [36 %-50 %] 36 % (12/07 1611) Weight:  [96.9 kg] 96.9 kg (12/08 0600)  Hemodynamic parameters for last 24 hours: PAP: (17-38)/(2-20) 21/7 CO:  [3.2 L/min-5.3 L/min] 5.2 L/min CI:  [1.5 L/min/m2-2.5 L/min/m2] 2.4 L/min/m2  Intake/Output from previous day: 12/07 0701 - 12/08 0700 In: 4652.1 [P.O.:360; I.V.:2581; Blood:775; IV Piggyback:936.1] Out: 3103 [Urine:1715; Blood:1150; Chest Tube:238] Intake/Output this shift: No intake/output data recorded.  General appearance: alert and cooperative Neurologic: intact Heart: regular rate and rhythm, S1, S2 normal, no murmur Lungs: clear to auscultation bilaterally Extremities: edema mild Wound: dressing dry  Lab Results: Recent Labs    01/13/22 1959 01/14/22 0436  WBC 15.1* 13.5*  HGB 12.8* 11.5*  HCT 36.4* 33.9*  PLT 97* 92*   BMET:  Recent Labs    01/13/22 1959 01/14/22 0436  NA 138 136  K 4.5 4.0  CL 110 106  CO2 21* 22  GLUCOSE 119* 114*  BUN 19 20  CREATININE 1.26* 1.25*  CALCIUM 8.2* 8.1*    PT/INR:  Recent Labs    01/13/22 1345  LABPROT 17.6*  INR 1.5*   ABG    Component Value Date/Time   PHART 7.287 (L) 01/13/2022 1708   HCO3 20.6 01/13/2022 1708   TCO2 22 01/13/2022 1708   ACIDBASEDEF 6.0 (H) 01/13/2022 1708   O2SAT 99 01/13/2022 1708   CBG (last 3)  Recent Labs    01/14/22 0433 01/14/22 0528 01/14/22 0625  GLUCAP 124* 118* 127*   CXR: mild left  basilar atelectasis  ECG: junctional 58. QRS 92 ms.  Assessment/Plan: S/P Procedure(s) (LRB): REDO AORTIC VALVE REPLACEMENT USING 25 MM EDWARDS INSPIRIS RESILIA AORTIC VALVE (N/A) TRANSESOPHAGEAL ECHOCARDIOGRAM (TEE) (N/A)  POD 1  Hemodynamically stable. Rhythm looks narrow complex junctional at 60. Not sure if there are p-waves yet. Preop he was in sinus brady 50's with short PR interval. Will hold off on beta blocker and put pacer on backup VVI 50 and observe. Higher risk for complete heart block after redo AVR.  DC swan, arterial line and all chest tubes.  Volume excess: Wt is 7.6 lbs over preop. Start diuresis.  IS, OOB, mobilize   Thrombocytopenia: observe. No lovenox.  Glucose under good control and no hx of DM. Transition off insulin drip to SSI. Probably be able to stop SSI tomorrow.   LOS: 1 day    Gaye Pollack 01/14/2022

## 2022-01-14 NOTE — Progress Notes (Signed)
TCTS Progress Note:  Doing exceptionally well after redo-AVR.   Has VVI backup wires.   Keep wires in until Monday, when he will likely DC  Plan floor tomorrow (if bed available)    Latest Ref Rng & Units 01/14/2022    4:20 PM 01/14/2022    4:36 AM 01/13/2022    7:59 PM  CBC  WBC 4.0 - 10.5 K/uL 16.6  13.5  15.1   Hemoglobin 13.0 - 17.0 g/dL 12.4  11.5  12.8   Hematocrit 39.0 - 52.0 % 35.9  33.9  36.4   Platelets 150 - 400 K/uL 106  92  97        Latest Ref Rng & Units 01/14/2022    4:20 PM 01/14/2022    4:36 AM 01/13/2022    7:59 PM  CMP  Glucose 70 - 99 mg/dL 145  114  119   BUN 8 - 23 mg/dL '21  20  19   '$ Creatinine 0.61 - 1.24 mg/dL 1.43  1.25  1.26   Sodium 135 - 145 mmol/L 135  136  138   Potassium 3.5 - 5.1 mmol/L 3.6  4.0  4.5   Chloride 98 - 111 mmol/L 103  106  110   CO2 22 - 32 mmol/L '24  22  21   '$ Calcium 8.9 - 10.3 mg/dL 8.6  8.1  8.2     ABG    Component Value Date/Time   PHART 7.287 (L) 01/13/2022 1708   PCO2ART 42.9 01/13/2022 1708   PO2ART 150 (H) 01/13/2022 1708   HCO3 20.6 01/13/2022 1708   TCO2 22 01/13/2022 1708   ACIDBASEDEF 6.0 (H) 01/13/2022 1708   O2SAT 99 01/13/2022 1708

## 2022-01-15 ENCOUNTER — Other Ambulatory Visit: Payer: Self-pay

## 2022-01-15 ENCOUNTER — Inpatient Hospital Stay (HOSPITAL_COMMUNITY): Payer: BC Managed Care – PPO

## 2022-01-15 LAB — BASIC METABOLIC PANEL
Anion gap: 9 (ref 5–15)
BUN: 20 mg/dL (ref 8–23)
CO2: 26 mmol/L (ref 22–32)
Calcium: 8.7 mg/dL — ABNORMAL LOW (ref 8.9–10.3)
Chloride: 105 mmol/L (ref 98–111)
Creatinine, Ser: 1.3 mg/dL — ABNORMAL HIGH (ref 0.61–1.24)
GFR, Estimated: >60 mL/min (ref >60)
Glucose, Bld: 110 mg/dL — ABNORMAL HIGH (ref 70–99)
Potassium: 4.1 mmol/L (ref 3.5–5.1)
Sodium: 140 mmol/L (ref 135–145)

## 2022-01-15 LAB — CBC
HCT: 32.8 % — ABNORMAL LOW (ref 39.0–52.0)
Hemoglobin: 11.3 g/dL — ABNORMAL LOW (ref 13.0–17.0)
MCH: 30.8 pg (ref 26.0–34.0)
MCHC: 34.5 g/dL (ref 30.0–36.0)
MCV: 89.4 fL (ref 80.0–100.0)
Platelets: 89 10*3/uL — ABNORMAL LOW (ref 150–400)
RBC: 3.67 MIL/uL — ABNORMAL LOW (ref 4.22–5.81)
RDW: 13.5 % (ref 11.5–15.5)
WBC: 12.3 10*3/uL — ABNORMAL HIGH (ref 4.0–10.5)
nRBC: 0 % (ref 0.0–0.2)

## 2022-01-15 LAB — GLUCOSE, CAPILLARY
Glucose-Capillary: 130 mg/dL — ABNORMAL HIGH (ref 70–99)
Glucose-Capillary: 104 mg/dL — ABNORMAL HIGH (ref 70–99)
Glucose-Capillary: 138 mg/dL — ABNORMAL HIGH (ref 70–99)
Glucose-Capillary: 92 mg/dL (ref 70–99)
Glucose-Capillary: 82 mg/dL (ref 70–99)
Glucose-Capillary: 99 mg/dL (ref 70–99)

## 2022-01-15 MED ORDER — FUROSEMIDE 10 MG/ML IJ SOLN: 20.0000 mg | Freq: Every day | INTRAMUSCULAR | Status: DC

## 2022-01-15 MED ORDER — SODIUM CHLORIDE 0.9% FLUSH: 3.0000 mL | INTRAVENOUS | Status: DC | PRN

## 2022-01-15 MED ORDER — ~~LOC~~ CARDIAC SURGERY, PATIENT & FAMILY EDUCATION: Freq: Once | Status: AC

## 2022-01-15 MED ORDER — SODIUM CHLORIDE 0.9% FLUSH
3.0000 mL | Freq: Two times a day (BID) | INTRAVENOUS | Status: DC
  Administered 2022-01-15: 3 mL via INTRAVENOUS

## 2022-01-15 MED ORDER — ENOXAPARIN SODIUM 30 MG/0.3ML IJ SOSY
30.0000 mg | PREFILLED_SYRINGE | Freq: Two times a day (BID) | INTRAMUSCULAR | Status: DC
  Administered 2022-01-15 – 2022-01-16 (×4): 30 mg via SUBCUTANEOUS
  Filled 2022-01-15 (×4): qty 0.3

## 2022-01-15 MED ORDER — SODIUM CHLORIDE 0.9 % IV SOLN: 250.0000 mL | INTRAVENOUS | Status: DC | PRN

## 2022-01-15 NOTE — Progress Notes (Signed)
TCTS Progress Note:  Doing exceptionally well after redo-AVR.    NAEO  Sitting up in chair, smiling Nonlaboured resp Sinus 1+ LE edema  XR reviewed, lung inflated mild congestion   Has VVI backup wires.    Keep wires in until Monday, when he will likely [place] floor today (if bed available) Daily lasix - 40 today, 20 tomorrow and daily (ordered) Transfer (ordered) Central line out (ordered) AC: Aspirin for valve DVT PPX (ordered) - aware that plts are 89, will see tomorrow as well.       Latest Ref Rng & Units 01/15/2022    5:35 AM 01/14/2022    4:20 PM 01/14/2022    4:36 AM  CBC  WBC 4.0 - 10.5 K/uL 12.3  16.6  13.5   Hemoglobin 13.0 - 17.0 g/dL 11.3  12.4  11.5   Hematocrit 39.0 - 52.0 % 32.8  35.9  33.9   Platelets 150 - 400 K/uL 89  106  92        Latest Ref Rng & Units 01/15/2022    5:35 AM 01/14/2022    4:20 PM 01/14/2022    4:36 AM  CMP  Glucose 70 - 99 mg/dL 110  145  114   BUN 8 - 23 mg/dL '20  21  20   '$ Creatinine 0.61 - 1.24 mg/dL 1.30  1.43  1.25   Sodium 135 - 145 mmol/L 140  135  136   Potassium 3.5 - 5.1 mmol/L 4.1  3.6  4.0   Chloride 98 - 111 mmol/L 105  103  106   CO2 22 - 32 mmol/L '26  24  22   '$ Calcium 8.9 - 10.3 mg/dL 8.7  8.6  8.1     ABG    Component Value Date/Time   PHART 7.287 (L) 01/13/2022 1708   PCO2ART 42.9 01/13/2022 1708   PO2ART 150 (H) 01/13/2022 1708   HCO3 20.6 01/13/2022 1708   TCO2 22 01/13/2022 1708   ACIDBASEDEF 6.0 (H) 01/13/2022 1708   O2SAT 99 01/13/2022 1708

## 2022-01-16 LAB — GLUCOSE, CAPILLARY
Glucose-Capillary: 90 mg/dL (ref 70–99)
Glucose-Capillary: 71 mg/dL (ref 70–99)
Glucose-Capillary: 87 mg/dL (ref 70–99)

## 2022-01-16 LAB — CBC
HCT: 33.9 % — ABNORMAL LOW (ref 39.0–52.0)
Hemoglobin: 11.4 g/dL — ABNORMAL LOW (ref 13.0–17.0)
MCH: 30.4 pg (ref 26.0–34.0)
MCHC: 33.6 g/dL (ref 30.0–36.0)
MCV: 90.4 fL (ref 80.0–100.0)
Platelets: 97 10*3/uL — ABNORMAL LOW (ref 150–400)
RBC: 3.75 MIL/uL — ABNORMAL LOW (ref 4.22–5.81)
RDW: 13.2 % (ref 11.5–15.5)
WBC: 10.6 10*3/uL — ABNORMAL HIGH (ref 4.0–10.5)
nRBC: 0 % (ref 0.0–0.2)

## 2022-01-16 LAB — BASIC METABOLIC PANEL
Anion gap: 10 (ref 5–15)
BUN: 23 mg/dL (ref 8–23)
CO2: 23 mmol/L (ref 22–32)
Calcium: 8.8 mg/dL — ABNORMAL LOW (ref 8.9–10.3)
Chloride: 105 mmol/L (ref 98–111)
Creatinine, Ser: 1.28 mg/dL — ABNORMAL HIGH (ref 0.61–1.24)
GFR, Estimated: >60 mL/min (ref >60)
Glucose, Bld: 98 mg/dL (ref 70–99)
Potassium: 4.1 mmol/L (ref 3.5–5.1)
Sodium: 138 mmol/L (ref 135–145)

## 2022-01-16 MED ORDER — LOSARTAN POTASSIUM 25 MG PO TABS
25.0000 mg | ORAL_TABLET | Freq: Every day | ORAL | Status: DC
  Administered 2022-01-16 – 2022-01-17 (×2): 25 mg via ORAL
  Filled 2022-01-16 (×2): qty 1

## 2022-01-16 NOTE — Plan of Care (Signed)
  Problem: Education: Goal: Knowledge of General Education information will improve Description: Including pain rating scale, medication(s)/side effects and non-pharmacologic comfort measures Outcome: Progressing   Problem: Clinical Measurements: Goal: Ability to maintain clinical measurements within normal limits will improve Outcome: Progressing Goal: Will remain free from infection Outcome: Progressing Goal: Diagnostic test results will improve Outcome: Progressing Goal: Respiratory complications will improve Outcome: Progressing Goal: Cardiovascular complication will be avoided Outcome: Progressing   Problem: Activity: Goal: Risk for activity intolerance will decrease Outcome: Progressing   Problem: Nutrition: Goal: Adequate nutrition will be maintained Outcome: Progressing   Problem: Coping: Goal: Level of anxiety will decrease Outcome: Progressing   Problem: Cardiac: Goal: Will achieve and/or maintain hemodynamic stability Outcome: Progressing

## 2022-01-16 NOTE — Progress Notes (Signed)
      [address]       [place] [address]             [phone]      3 Days Post-Op Procedure(s) (LRB): REDO AORTIC VALVE REPLACEMENT USING 25 MM EDWARDS INSPIRIS RESILIA AORTIC VALVE (N/A) TRANSESOPHAGEAL ECHOCARDIOGRAM (TEE) (N/A)  Subjective:  Patient has no complaints.  He is ambulating independently in the hallway.    Objective: Vital signs in last 24 hours: Temp:  [98 F (36.7 C)-98.7 F (37.1 C)] 98.1 F (36.7 C) (12/10 0741) Pulse Rate:  [60-83] 83 (12/10 0741) Cardiac Rhythm: Heart block (12/10 0700) Resp:  [15-18] 18 (12/10 0741) BP: (120-146)/(76-89) 136/79 (12/10 0741) SpO2:  [96 %-100 %] 100 % (12/10 0741) Weight:  [93.6 kg] 93.6 kg (12/10 0657)  Intake/Output from previous day: 12/09 0701 - 12/10 0700 In: 480 [P.O.:480] Out: 1500 [Urine:1500]  General appearance: alert, cooperative, and no distress Heart: regular rate and rhythm Lungs: clear to auscultation bilaterally Abdomen: soft, non-tender; bowel sounds normal; no masses,  no organomegaly Extremities: edema none present Wound: clean and dry  Lab Results: Recent Labs    01/15/22 0535 01/16/22 0105  WBC 12.3* 10.6*  HGB 11.3* 11.4*  HCT 32.8* 33.9*  PLT 89* 97*   BMET:  Recent Labs    01/15/22 0535 01/16/22 0105  NA 140 138  K 4.1 4.1  CL 105 105  CO2 26 23  GLUCOSE 110* 98  BUN 20 23  CREATININE 1.30* 1.28*  CALCIUM 8.7* 8.8*    PT/INR:  Recent Labs    01/13/22 1345  LABPROT 17.6*  INR 1.5*   ABG    Component Value Date/Time   PHART 7.287 (L) 01/13/2022 1708   HCO3 20.6 01/13/2022 1708   TCO2 22 01/13/2022 1708   ACIDBASEDEF 6.0 (H) 01/13/2022 1708   O2SAT 99 01/13/2022 1708   CBG (last 3)  Recent Labs    01/15/22 1551 01/15/22 2041 01/16/22 0600  GLUCAP 82 99 90    Assessment/Plan: S/P Procedure(s) (LRB): REDO AORTIC VALVE REPLACEMENT USING 25 MM EDWARDS INSPIRIS RESILIA AORTIC VALVE (N/A) TRANSESOPHAGEAL ECHOCARDIOGRAM (TEE)  (N/A)  CV- NSR with PVCs, + HTN- will resume home Cozaar at reduced dose.. plan to remove EPW in AM per Dr. Lajean Manes- no acute issues, off oxygen, continue IS Renal- creatinine stable, weight is below baseline, will stop Lasix Post operative thrombocytopenia- improved up to 97K, ok to continue Lovenox Dispo- patient looks great, overall doing very well, will plan to d/c EPW in AM and d/c if no issues arise   LOS: 3 days    Ellwood Handler, PA-C 01/16/2022

## 2022-01-16 NOTE — Progress Notes (Signed)
EKG complete.

## 2022-01-17 MED ORDER — TRAMADOL HCL 50 MG PO TABS: 50.0000 mg | ORAL_TABLET | Freq: Four times a day (QID) | ORAL | 0 refills | Status: AC | PRN

## 2022-01-17 MED ORDER — ACETAMINOPHEN 500 MG PO TABS: 500.0000 mg | ORAL_TABLET | Freq: Four times a day (QID) | ORAL | 0 refills | Status: AC | PRN

## 2022-01-17 MED ORDER — ASPIRIN 325 MG PO TBEC: 325.0000 mg | DELAYED_RELEASE_TABLET | Freq: Every day | ORAL | Status: AC

## 2022-01-17 NOTE — Progress Notes (Addendum)
      [address]       [place] [address]             [phone]      3 Days Post-Op Procedure(s) (LRB): REDO AORTIC VALVE REPLACEMENT USING 25 MM EDWARDS INSPIRIS RESILIA AORTIC VALVE (N/A) TRANSESOPHAGEAL ECHOCARDIOGRAM (TEE) (N/A)  Subjective: Up in the chair eating breakfast after a walk in the hall.  No complaints or concerns.   Having appropriate bowel function.   Objective: Vital signs in last 24 hours: Temp:  [98.3 F (36.8 C)-98.8 F (37.1 C)] 98.8 F (37.1 C) (12/11 0528) Pulse Rate:  [54-78] 78 (12/11 0528) Cardiac Rhythm: Other (Comment) (12/10 2005) Resp:  [16-18] 16 (12/11 0528) BP: (117-138)/(66-87) 125/87 (12/11 0528) SpO2:  [95 %-100 %] 95 % (12/11 0528) Weight:  [92.8 kg] 92.8 kg (12/11 0528)  Intake/Output from previous day: 12/10 0701 - 12/11 0700 In: 120 [P.O.:120] Out: -   General appearance: alert, cooperative, and no distress Heart: SR, no significant arrhythmias Lungs: breath sounds are clear, normal WOB on RA. Extremities: no peripheral Wound: the sternotomy incision is well approximated and dry.  Lab Results: Recent Labs    01/15/22 0535 01/16/22 0105  WBC 12.3* 10.6*  HGB 11.3* 11.4*  HCT 32.8* 33.9*  PLT 89* 97*    BMET:  Recent Labs    01/15/22 0535 01/16/22 0105  NA 140 138  K 4.1 4.1  CL 105 105  CO2 26 23  GLUCOSE 110* 98  BUN 20 23  CREATININE 1.30* 1.28*  CALCIUM 8.7* 8.8*     PT/INR:  No results for input(s): "LABPROT", "INR" in the last 72 hours.  ABG    Component Value Date/Time   PHART 7.287 (L) 01/13/2022 1708   HCO3 20.6 01/13/2022 1708   TCO2 22 01/13/2022 1708   ACIDBASEDEF 6.0 (H) 01/13/2022 1708   O2SAT 99 01/13/2022 1708   CBG (last 3)  Recent Labs    01/16/22 0600 01/16/22 1216 01/16/22 1707  GLUCAP 90 71 87     Assessment/Plan: S/P Procedure(s) (LRB): REDO AORTIC VALVE REPLACEMENT USING 25 MM EDWARDS INSPIRIS RESILIA AORTIC VALVE (N/A) TRANSESOPHAGEAL  ECHOCARDIOGRAM (TEE) (N/A)  -POD4 re-do AVR. Stable VS and cardiac rhythm. Independent with mobility. Removing pacer wires this morning.  -PULM- Oxygenating well on RA.  -Mild EBL anemia, last Hct 33%.  -Disposition-removing pacer wires this morning, planning for discharge to home later today.     LOS: 4 days    Reginald Myers [phone] 01/17/2022   Chart reviewed, patient examined, agree with above. He looks good. Rhythm is sinus with PAC's 70's to 80's. I removed pacing wires myself at 8 AM without difficulty. Plan home later today.

## 2022-01-17 NOTE — Plan of Care (Signed)
DISCHARGE NOTE HOME Reginald Myers to be discharged home per MD order. Discussed prescriptions and follow up appointments with the patient. Medication list explained in detail. Patient verbalized understanding.  Patient free of lines, drains, and wounds.   An After Visit Summary (AVS) was printed and given to the patient. Patient escorted via wheelchair, and discharged home via private auto.  Stephan Minister, RN

## 2022-01-17 NOTE — Progress Notes (Signed)
Pt has been ambulating independently multiple times a day, 20 min at a time. Feels well. Does have PACs sitting on EOB. Pt moving out of bed independently as well. Discussed with pt IS, sternal precautions, exercise at home, and CRPII. Pt very receptive. Plans to continue walking long sessions and we review signs/sx. He will carry his cellphone. Will refer to [place]. Gave heart healthy diet.  [phone] Reginald Myers BS, ACSM-CEP 01/17/2022 10:03 AM

## 2022-01-17 NOTE — Progress Notes (Signed)
Patient isn't leaving until after 1pm due to pace wirers removal.   Francena Hanly SWOT RN

## 2022-01-18 LAB — TYPE AND SCREEN
ABO/RH(D): O POS
Antibody Screen: NEGATIVE
Unit division: 0
Unit division: 0
Unit division: 0
Unit division: 0
Unit division: 0
Unit division: 0

## 2022-01-18 LAB — BPAM RBC
Blood Product Expiration Date: 202401012359
Blood Product Expiration Date: 202401012359
Blood Product Expiration Date: 202401022359
Blood Product Expiration Date: 202401022359
Blood Product Expiration Date: 202401052359
Blood Product Expiration Date: 202401052359
ISSUE DATE / TIME: 202312070748
ISSUE DATE / TIME: 202312070748
ISSUE DATE / TIME: 202312070748
ISSUE DATE / TIME: 202312070748
Unit Type and Rh: 5100
Unit Type and Rh: 5100
Unit Type and Rh: 5100
Unit Type and Rh: 5100
Unit Type and Rh: 5100
Unit Type and Rh: 5100

## 2022-01-21 MED FILL — Heparin Sodium (Porcine) Inj 1000 Unit/ML: INTRAMUSCULAR | Qty: 20 | Status: AC

## 2022-01-21 MED FILL — Sodium Chloride IV Soln 0.9%: INTRAVENOUS | Qty: 2000 | Status: AC

## 2022-01-21 MED FILL — Electrolyte-R (PH 7.4) Solution: INTRAVENOUS | Qty: 3000 | Status: AC

## 2022-01-21 MED FILL — Lidocaine HCl Local Preservative Free (PF) Inj 2%: INTRAMUSCULAR | Qty: 14 | Status: AC

## 2022-01-21 MED FILL — Sodium Bicarbonate IV Soln 8.4%: INTRAVENOUS | Qty: 50 | Status: AC

## 2022-01-21 MED FILL — Mannitol IV Soln 20%: INTRAVENOUS | Qty: 500 | Status: AC

## 2022-01-24 ENCOUNTER — Encounter (HOSPITAL_COMMUNITY): Payer: Self-pay

## 2022-01-24 ENCOUNTER — Other Ambulatory Visit (HOSPITAL_COMMUNITY): Payer: BC Managed Care – PPO

## 2022-01-27 ENCOUNTER — Ambulatory Visit: Payer: Self-pay

## 2022-01-27 ENCOUNTER — Telehealth (HOSPITAL_COMMUNITY): Payer: Self-pay

## 2022-01-27 DIAGNOSIS — Z4802 Encounter for removal of sutures: Secondary | ICD-10-CM

## 2022-01-27 NOTE — Telephone Encounter (Signed)
Called patient to see if he is interested in the Cardiac Rehab Program. Patient expressed interest. Explained scheduling process and went over insurance, patient verbalized understanding. Will contact patient for scheduling once f/u has been completed.  °

## 2022-01-27 NOTE — Progress Notes (Signed)
Patient arrived for nurse visit to remove sutures post- procedure Redo-AVR with Dr. Cyndia Bent 01/13/22.  Three sutures removed with no signs/ symptoms of infection noted.  Patient tolerated procedure well.  Incisions well- approximated and look to be healing nicely. Patient/ family instructed to keep the incision sites clean and dry.  Patient/ family acknowledged instructions given.

## 2022-02-03 ENCOUNTER — Ambulatory Visit: Payer: BC Managed Care – PPO | Admitting: Nurse Practitioner

## 2022-02-14 ENCOUNTER — Ambulatory Visit
Admission: RE | Admit: 2022-02-14 | Discharge: 2022-02-14 | Disposition: A | Discharge status: Home / Self Care | Payer: BC Managed Care – PPO | Source: Ambulatory Visit | Attending: Surgery | Admitting: Surgery

## 2022-02-14 ENCOUNTER — Ambulatory Visit (INDEPENDENT_AMBULATORY_CARE_PROVIDER_SITE_OTHER): Payer: Self-pay | Admitting: Surgery

## 2022-02-14 ENCOUNTER — Other Ambulatory Visit: Payer: Self-pay | Admitting: Surgery

## 2022-02-14 VITALS — BP 138/104 | HR 87 | Resp 20 | Ht 73.0 in | Wt 212.0 lb

## 2022-02-14 DIAGNOSIS — Z952 Presence of prosthetic heart valve: Secondary | ICD-10-CM | POA: Diagnosis not present

## 2022-02-15 ENCOUNTER — Encounter: Payer: Self-pay | Admitting: Surgery

## 2022-02-15 MED FILL — Lidocaine HCl Local Preservative Free (PF) Inj 2%: INTRAMUSCULAR | Qty: 14 | Status: AC

## 2022-02-15 MED FILL — Heparin Sodium (Porcine) Inj 1000 Unit/ML: Qty: 1000 | Status: AC

## 2022-02-15 MED FILL — Potassium Chloride Inj 2 mEq/ML: INTRAVENOUS | Qty: 40 | Status: AC

## 2022-02-15 NOTE — Progress Notes (Signed)
\     HPI: Patient returns for routine postoperative follow-up having undergone redo median sternotomy and redo AVR using a  25 mm Edwards INSPIRIS RESILIA valve on 01/13/2022. The patient's early postoperative recovery while in the hospital was notable for an uncomplicated postop course.  Since hospital discharge the patient reports that he has been feeling well. He is walking daily without chest pain or shortness of breath.   Current Outpatient Medications  Medication Sig Dispense Refill   acetaminophen (TYLENOL) 500 MG tablet Take 1-2 tablets (500-1,000 mg total) by mouth every 6 (six) hours as needed. 30 tablet 0   aspirin EC 325 MG tablet Take 1 tablet (325 mg total) by mouth daily.     atorvastatin (LIPITOR) 20 MG tablet TAKE 1 TABLET(20 MG) BY MOUTH DAILY 90 tablet 3   losartan (COZAAR) 50 MG tablet Take 50 mg by mouth in the morning.     No current facility-administered medications for this visit.    Physical Exam: BP (!) 138/104 (BP Location: Left Arm, Patient Position: Sitting)   Pulse 87   Resp 20   Ht '6\' 1"'$  (1.854 m)   Wt 212 lb (96.2 kg)   SpO2 100% Comment: RA  BMI 27.97 kg/m  He looks great Cardiac exam shows a regular rate and rhythm with normal heart sounds. Lungs are clear The incision is healing well. There is no peripheral edema.  Diagnostic Tests:  Narrative & Impression  CLINICAL DATA:  Status post AVR a month ago. No current chest complaints.   EXAM: CHEST - 2 VIEW   COMPARISON:  Chest x-ray dated January 15, 2022.   FINDINGS: Normal heart size status post AVR, mitral valve repair, and left atrial appendage clipping. Normal pulmonary vascularity. No focal consolidation, pleural effusion, or pneumothorax. No acute osseous abnormality.   IMPRESSION: 1. No acute cardiopulmonary disease.     Electronically Signed   By: Titus Dubin M.D.   On: 02/14/2022 15:13      Impression:  He is doing very well 1 month following his surgery. He has  returned to driving. I asked him not to do any heavy lifting of more than 10 lbs for 3 months postop. He has a followup echo with Dr. [name] next week.   Plan:  He will continue to followup with is PCP and Dr. [name]. He will return to see me if he has any problems with his incision.   Gaye Pollack, MD [facility] [phone]

## 2022-02-16 ENCOUNTER — Ambulatory Visit: Payer: BC Managed Care – PPO | Admitting: Surgery

## 2022-02-22 ENCOUNTER — Ambulatory Visit: Payer: BC Managed Care – PPO | Attending: Nurse Practitioner | Admitting: Physician Assistant

## 2022-02-22 ENCOUNTER — Encounter: Payer: Self-pay | Admitting: Physician Assistant

## 2022-02-22 VITALS — BP 130/90 | HR 75 | Ht 73.0 in | Wt 205.4 lb

## 2022-02-22 DIAGNOSIS — I5021 Acute systolic (congestive) heart failure: Secondary | ICD-10-CM | POA: Diagnosis not present

## 2022-02-22 DIAGNOSIS — I10 Essential (primary) hypertension: Secondary | ICD-10-CM | POA: Diagnosis not present

## 2022-02-22 DIAGNOSIS — Z9889 Other specified postprocedural states: Secondary | ICD-10-CM | POA: Diagnosis not present

## 2022-02-22 DIAGNOSIS — E782 Mixed hyperlipidemia: Secondary | ICD-10-CM

## 2022-02-22 DIAGNOSIS — I351 Nonrheumatic aortic (valve) insufficiency: Secondary | ICD-10-CM

## 2022-02-22 DIAGNOSIS — Z8679 Personal history of other diseases of the circulatory system: Secondary | ICD-10-CM

## 2022-02-22 NOTE — Progress Notes (Signed)
Office Visit    Patient Name: Reginald Myers Date of Encounter: 02/22/2022  PCP:  Leeroy Cha, [place]  Cardiologist:  Larae Grooms, MD  Advanced Practice Provider:  No care team member to display Electrophysiologist:  None   HPI    Reginald Myers is a 67 y.o. male with past medical history significant for AVR and mitral valve with maze procedure back in 2014 with Dr. Cyndia Bent, hypertension, redo AVR with Dr. Cyndia Bent 01/13/2022 presents today for hospital follow-up appointment.  He was seen by Dr. [name] back in September and at that time he had an echocardiogram which showed mild to moderate aortic valve regurgitation.  Bioprosthetic aortic valve with mild to moderate perivalvular leakage.  Gradient 35 mmHg suggestive of significant bioprosthetic valve stenosis.  Blood pressure has been well-controlled.  Plan for SAVR with Dr. Cyndia Bent.  All 01/14/2022 underwent redo sternotomy for redo aortic valve replacement.  He was placed on backup pacing and not started on beta-blocker therapy.  He quickly regained independence with mobility and was discharged in stable condition.  Today, he states that he is walking 5 miles a day without any issue.  He went into the surgery leading a very active lifestyle and was able to return to activities quickly.  His blood pressure has been well-controlled at home however, it is slightly elevated today in clinic 130/90.  He states that it is usually 875I to 433I systolic and 95J sometimes 88C diastolic.  Heart rate is usually in the 50s.  At 75 bpm today.  We discussed that this will likely continue to drop as his heart recovers.  He has an echocardiogram already scheduled for Thursday of this week to check his prosthetic aortic.  He does not really think that cardiac rehab would benefit him since he is already so active at baseline.  Reports no shortness of breath nor dyspnea on exertion. Reports no chest pain,  pressure, or tightness. No edema, orthopnea, PND. Reports no palpitations.    Past Medical History    Past Medical History:  Diagnosis Date   Acute systolic heart failure ([place])    Aortic valve disorders    Atrial fibrillation (HCC)    Bell's palsy 09/2009   right-residual weakness with fatique    Dysrhythmia    Heart murmur    Hemorrhoids    Hyperlipidemia    Hypertension    Orthopnea    Right knee DJD    prior meniscal surgery   Shortness of breath    Testicular cancer ([place]) 1994   Past Surgical History:  Procedure Laterality Date   AORTIC VALVE REPLACEMENT N/A 09/07/2012   Procedure: AORTIC VALVE REPLACEMENT (AVR);  Surgeon: Gaye Pollack, MD;  Location: [facility] [place];  Service: Open Heart Surgery;  Laterality: N/A;   AORTIC VALVE REPLACEMENT N/A 01/13/2022   Procedure: REDO AORTIC VALVE REPLACEMENT USING 25 MM EDWARDS INSPIRIS RESILIA AORTIC VALVE;  Surgeon: Gaye Pollack, MD;  Location: [place];  Service: Open Heart Surgery;  Laterality: N/A;   CARDIAC CATHETERIZATION     CARDIAC VALVE REPLACEMENT     CARDIOVERSION N/A 07/13/2012   Procedure: CARDIOVERSION;  Surgeon: [name], MD;  Location: [place];  Service: Cardiovascular;  Laterality: N/A;   CARDIOVERSION N/A 08/08/2012   Procedure: CARDIOVERSION;  Surgeon: [name], MD;  Location: [facility];  Service: Cardiovascular;  Laterality: N/A;   CLIPPING OF ATRIAL APPENDAGE N/A 09/07/2012   Procedure: CLIPPING  OF ATRIAL APPENDAGE;  Surgeon: Gaye Pollack, MD;  Location: [facility];  Service: Open Heart Surgery;  Laterality: N/A;   INTRAOPERATIVE TRANSESOPHAGEAL ECHOCARDIOGRAM N/A 09/07/2012   Procedure: INTRAOPERATIVE TRANSESOPHAGEAL ECHOCARDIOGRAM;  Surgeon: Gaye Pollack, MD;  Location: [facility];  Service: Open Heart Surgery;  Laterality: N/A;   KNEE ARTHROSCOPY W/ MENISCAL REPAIR Right 02/07/1986   LEFT AND RIGHT HEART CATHETERIZATION WITH CORONARY ANGIOGRAM N/A 08/15/2012   Procedure: LEFT AND RIGHT HEART  CATHETERIZATION WITH CORONARY ANGIOGRAM;  Surgeon: [name], MD;  Location: [facility] CATH LAB;  Service: Cardiovascular;  Laterality: N/A;   MAZE N/A 09/07/2012   Procedure: MAZE;  Surgeon: Gaye Pollack, MD;  Location: [place];  Service: Open Heart Surgery;  Laterality: N/A;   MITRAL VALVE REPAIR N/A 09/07/2012   Procedure: MITRAL VALVE REPAIR (MVR);  Surgeon: Gaye Pollack, MD;  Location: [place];  Service: Open Heart Surgery;  Laterality: N/A;   ORCHIECTOMY Left 02/08/1991   TEE WITHOUT CARDIOVERSION N/A 07/13/2012   Procedure: TRANSESOPHAGEAL ECHOCARDIOGRAM (TEE);  Surgeon: [name], MD;  Location: [place];  Service: Cardiovascular;  Laterality: N/A;   TEE WITHOUT CARDIOVERSION N/A 02/03/2021   Procedure: TRANSESOPHAGEAL ECHOCARDIOGRAM (TEE);  Surgeon: Geralynn Rile, MD;  Location: [place];  Service: Cardiovascular;  Laterality: N/A;   TEE WITHOUT CARDIOVERSION N/A 01/13/2022   Procedure: TRANSESOPHAGEAL ECHOCARDIOGRAM (TEE);  Surgeon: Gaye Pollack, MD;  Location: [place];  Service: Open Heart Surgery;  Laterality: N/A;    Allergies  Allergies  Allergen Reactions   Amiodarone Other (See Comments)    Intolerance due to nausea Other reaction(s): upset stomach   Loperamide-Simethicone     Other reaction(s): Not available   Statins Other (See Comments)    Muscle aches  - currently taking a Statin drug Other reaction(s): not tolerated     EKGs/Labs/Other Studies Reviewed:   The following studies were reviewed today:  Echocardiogram 11/23/2021 IMPRESSIONS     1. Degenerative tissue bioprosthetic AVR Overall findings similar to TEE  done 05/20/20.   2. Left ventricular ejection fraction, by estimation, is 55 to 60%. The  left ventricle has normal function. The left ventricle has no regional  wall motion abnormalities. Left ventricular diastolic parameters are  indeterminate.   3. Right ventricular systolic function is normal. The right  ventricular  size is normal. There is normal pulmonary artery systolic pressure.   4. Right atrial size was mildly dilated.   5. Post repair with 30 mm Sorin ring trivial residual MR with mean  diastolic gradient 7 mmhg No MS . The mitral valve has been  repaired/replaced. Trivial mitral valve regurgitation. There is a 30 mm  prosthetic annuloplasty ring present in the mitral  position. Procedure Date: 09/07/2012.   6. Tricuspid valve regurgitation is mild to moderate.   7. 2014 post AVR with 25 mm pericardial Magna Ease valve Leaflets are  thickened and calcified moderate AS and moderate to severe central AR .  The aortic valve has been repaired/replaced. Aortic valve regurgitation is  moderate to severe. Moderate aortic  valve stenosis. There is a 25 mm pericardial valve present in the aortic  position. Procedure Date: 09/07/2012.   FINDINGS   Left Ventricle: Left ventricular ejection fraction, by estimation, is 55  to 60%. The left ventricle has normal function. The left ventricle has no  regional wall motion abnormalities. The left ventricular internal cavity  size was normal in size. There is   no left ventricular hypertrophy. Left  ventricular diastolic parameters  are indeterminate.   Right Ventricle: The right ventricular size is normal. No increase in  right ventricular wall thickness. Right ventricular systolic function is  normal. There is normal pulmonary artery systolic pressure. The tricuspid  regurgitant velocity is 2.57 m/s, and   with an assumed right atrial pressure of 3 mmHg, the estimated right  ventricular systolic pressure is 88.5 mmHg.   Left Atrium: Left atrial size was normal in size.   Right Atrium: Right atrial size was mildly dilated.   Pericardium: There is no evidence of pericardial effusion.   Mitral Valve: Post repair with 30 mm Sorin ring trivial residual MR with  mean diastolic gradient 7 mmhg No MS. The mitral valve has been  repaired/replaced.  Trivial mitral valve regurgitation. There is a 30 mm  prosthetic annuloplasty ring present in the  mitral position. Procedure Date: 09/07/2012. MV peak gradient, 16.1 mmHg.  The mean mitral valve gradient is 5.7 mmHg.   Tricuspid Valve: The tricuspid valve is normal in structure. Tricuspid  valve regurgitation is mild to moderate.   Aortic Valve: 2014 post AVR with 25 mm pericardial Magna Ease valve  Leaflets are thickened and calcified moderate AS and moderate to severe  central AR. The aortic valve has been repaired/replaced. Aortic valve  regurgitation is moderate to severe. Aortic  regurgitation PHT measures 1149 msec. Moderate aortic stenosis is present.  Aortic valve mean gradient measures 31.0 mmHg. Aortic valve peak gradient  measures 59.9 mmHg. Aortic valve area, by VTI measures 1.54 cm. There is  a 25 mm pericardial valve  present in the aortic position. Procedure Date: 09/07/2012.   Pulmonic Valve: The pulmonic valve was normal in structure. Pulmonic valve  regurgitation is mild.   Aorta: The aortic root is normal in size and structure.   IAS/Shunts: No atrial level shunt detected by color flow Doppler.   Additional Comments: Degenerative tissue bioprosthetic AVR Overall  findings similar to TEE done 05/20/20.   EKG:  EKG is not ordered today.   Recent Labs: 01/11/2022: ALT 14 01/14/2022: Magnesium 2.4 01/16/2022: BUN 23; Creatinine, Ser 1.28; Hemoglobin 11.4; Platelets 97; Potassium 4.1; Sodium 138  Recent Lipid Panel No results found for: "CHOL", "TRIG", "HDL", "CHOLHDL", "VLDL", "LDLCALC", "LDLDIRECT"  Home Medications   Current Meds  Medication Sig   acetaminophen (TYLENOL) 500 MG tablet Take 1-2 tablets (500-1,000 mg total) by mouth every 6 (six) hours as needed.   aspirin EC 325 MG tablet Take 1 tablet (325 mg total) by mouth daily.   atorvastatin (LIPITOR) 20 MG tablet TAKE 1 TABLET(20 MG) BY MOUTH DAILY   losartan (COZAAR) 50 MG tablet Take 50 mg by mouth  in the morning.     Review of Systems      All other systems reviewed and are otherwise negative except as noted above.  Physical Exam    VS:  BP (!) 130/90   Pulse 75   Ht '6\' 1"'$  (1.854 m)   Wt 205 lb 6.4 oz (93.2 kg)   SpO2 98%   BMI 27.10 kg/m  , BMI Body mass index is 27.1 kg/m.  Wt Readings from Last 3 Encounters:  02/22/22 205 lb 6.4 oz (93.2 kg)  02/14/22 212 lb (96.2 kg)  01/17/22 204 lb 8 oz (92.8 kg)     GEN: Well nourished, well developed, in no acute distress. HEENT: normal. Neck: Supple, no JVD, carotid bruits, or masses. Cardiac: RRR, + soft systolic murmur, rubs, or gallops. No clubbing, cyanosis,  edema.  Radials/PT 2+ and equal bilaterally.  Respiratory:  Respirations regular and unlabored, clear to auscultation bilaterally. GI: Soft, nontender, nondistended. MS: No deformity or atrophy. Skin: Warm and dry, no rash. Neuro:  Strength and sensation are intact. Psych: Normal affect.  Assessment & Plan    Severe aortic regurgitation s/p redo AVR -doing well post op -echo this week already scheduled -walking 5 miles a day, asymptomatic -follow-up with TCTS -does not want to do rehab at this time -continue lipitor '20mg'$  daily and cozaar '50mg'$  daily -off ASA 325, (discussed starting ASA 81, however no CAD so holding off at this time)  S/P MVR and MAZE 2014 -echo this week  Hypertension -slightly elevated in the clinic today -continue to monitor at home -continue current medications -low sodium diets  Hyperlipidemia -LDL 101 -repeat lipid panel 09/2022    Cardiac Rehabilitation Eligibility Assessment  The patient has declined or is not appropriate for cardiac rehabilitation.   Disposition: Follow up 2 months with Larae Grooms, MD or APP.  Signed, Elgie Collard, PA-C 02/22/2022, 4:36 PM [facility]

## 2022-02-22 NOTE — Patient Instructions (Signed)
Medication Instructions:  Your physician recommends that you continue on your current medications as directed. Please refer to the Current Medication list given to you today.  *If you need a refill on your cardiac medications before your next appointment, please call your pharmacy*   Lab Work: None ordered If you have labs (blood work) drawn today and your tests are completely normal, you will receive your results only by: [place] (if you have MyChart) OR A paper copy in the mail If you have any lab test that is abnormal or we need to change your treatment, we will call you to review the results.   Testing/Procedures: Echo 02/24/22 at 11:15 AM as scheduled   Follow-Up: At [facility], you and your health needs are our priority.  As part of our continuing mission to provide you with exceptional heart care, we have created designated Provider Care Teams.  These Care Teams include your primary Cardiologist (physician) and Advanced Practice Providers (APPs -  Physician Assistants and Nurse Practitioners) who all work together to provide you with the care you need, when you need it.   Your next appointment:   04/22/22 at 8:20 AM  Provider:   Larae Grooms, MD

## 2022-02-24 ENCOUNTER — Ambulatory Visit (HOSPITAL_COMMUNITY): Payer: BC Managed Care – PPO | Attending: Internal Medicine

## 2022-02-24 DIAGNOSIS — I350 Nonrheumatic aortic (valve) stenosis: Secondary | ICD-10-CM

## 2022-02-24 LAB — ECHOCARDIOGRAM COMPLETE
AR max vel: 1.6 cm2
AV Area VTI: 1.1 cm2
AV Area mean vel: 1.34 cm2
AV Mean grad: 16 mmHg
AV Peak grad: 24.8 mmHg
Ao pk vel: 2.49 m/s
Area-P 1/2: 2.45 cm2
S' Lateral: 3.1 cm

## 2022-04-03 ENCOUNTER — Other Ambulatory Visit: Payer: Self-pay | Admitting: Interventional Cardiology

## 2022-04-21 NOTE — Progress Notes (Signed)
Cardiology Office Note   Date:  04/22/2022   ID:  Reginald Myers, DOB [birth year], MRN [id number]  PCP:  Leeroy Cha, MD    No chief complaint on file.  S/p AVR  Wt Readings from Last 3 Encounters:  04/22/22 210 lb 9.6 oz (95.5 kg)  02/22/22 205 lb 6.4 oz (93.2 kg)  02/14/22 212 lb (96.2 kg)       History of Present Illness: Reginald Myers is a 67 y.o. male     who had an AVR , mitral valve ring placed and Maze procedure in 2014.  Dr. Cyndia Bent.    In 2019, he was started on some HTN meds.  BP was 99991111 systolic at Dr. Inda Merlin office. He had not been eating well since 11/18 and he thinks the BP is the result.  He was started on Exforge 5/160 mg and felt somewhat lightheaded.  Ultimately, went off of these meds, but had high BP at a MOHS procedure in 2022.   Amlodipine and losartan were restarted.    BPs at home after rest is in the A999333 range systolic.  BP is high at the MD's office.    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 Elevated mean gradient 35 mmHg suggesting significant  bioprosthetic valve stenosis. The aortic valve has been repaired/replaced.  Aortic valve regurgitation is mild to  moderate. Aortic regurgitation PHT measures 475 msec." TEE recommended.   BP at home is in the 110-120/60-70.  Continues as of 2023.   Saw Dr. Cyndia Bent and plan is for SAVR with tissue valve in 12/23.  He has recovered well. He was walking a lot.  If he has an issue with this valve, the plan would be for TAVR in 10-15 years.      Past Medical History:  Diagnosis Date   Acute systolic heart failure ([place])    Aortic valve disorders    Atrial fibrillation (H[facility])    Bell's palsy 09/2009   right-residual weakness with fatique    Dysrhythmia    Heart murmur    Hemorrhoids    Hyperlipidemia    Hypertension    Orthopnea    Right knee DJD    prior meniscal surgery   Shortness of breath    Testicular  cancer ([place]) 1994    Past Surgical History:  Procedure Laterality Date   AORTIC VALVE REPLACEMENT N/A 09/07/2012   Procedure: AORTIC VALVE REPLACEMENT (AVR);  Surgeon: Gaye Pollack, MD;  Location: [place];  Service: Open Heart Surgery;  Laterality: N/A;   AORTIC VALVE REPLACEMENT N/A 01/13/2022   Procedure: REDO AORTIC VALVE REPLACEMENT USING 25 MM EDWARDS INSPIRIS RESILIA AORTIC VALVE;  Surgeon: Gaye Pollack, MD;  Location: [place];  Service: Open Heart Surgery;  Laterality: N/A;   CARDIAC CATHETERIZATION     CARDIAC VALVE REPLACEMENT     CARDIOVERSION N/A 07/13/2012   Procedure: CARDIOVERSION;  Surgeon: [name], MD;  Location: [place];  Service: Cardiovascular;  Laterality: N/A;   CARDIOVERSION N/A 08/08/2012   Procedure: CARDIOVERSION;  Surgeon: [name], MD;  Location: [place];  Service: Cardiovascular;  Laterality: N/A;   CLIPPING OF ATRIAL APPENDAGE N/A 09/07/2012   Procedure: CLIPPING OF ATRIAL APPENDAGE;  Surgeon: Gaye Pollack, MD;  Location: [place];  Service: Open Heart Surgery;  Laterality: N/A;   INTRAOPERATIVE TRANSESOPHAGEAL ECHOCARDIOGRAM N/A 09/07/2012   Procedure: INTRAOPERATIVE TRANSESOPHAGEAL ECHOCARDIOGRAM;  Surgeon: Gaye Pollack, MD;  [facility];  Service: Open Heart Surgery;  Laterality: N/A;   KNEE ARTHROSCOPY W/ MENISCAL REPAIR Right 02/07/1986   LEFT AND RIGHT HEART CATHETERIZATION WITH CORONARY ANGIOGRAM N/A 08/15/2012   Procedure: LEFT AND RIGHT HEART CATHETERIZATION WITH CORONARY ANGIOGRAM;  Surgeon: [name], MD;  Location: [facility] CATH LAB;  Service: Cardiovascular;  Laterality: N/A;   MAZE N/A 09/07/2012   Procedure: MAZE;  Surgeon: Gaye Pollack, MD;  Location: [place];  Service: Open Heart Surgery;  Laterality: N/A;   MITRAL VALVE REPAIR N/A 09/07/2012   Procedure: MITRAL VALVE REPAIR (MVR);  Surgeon: Gaye Pollack, MD;  Location: [place];  Service: Open Heart Surgery;  Laterality: N/A;   ORCHIECTOMY Left  02/08/1991   TEE WITHOUT CARDIOVERSION N/A 07/13/2012   Procedure: TRANSESOPHAGEAL ECHOCARDIOGRAM (TEE);  Surgeon: [name], MD;  Location: [place];  Service: Cardiovascular;  Laterality: N/A;   TEE WITHOUT CARDIOVERSION N/A 02/03/2021   Procedure: TRANSESOPHAGEAL ECHOCARDIOGRAM (TEE);  Surgeon: Geralynn Rile, MD;  Location: [place];  Service: Cardiovascular;  Laterality: N/A;   TEE WITHOUT CARDIOVERSION N/A 01/13/2022   Procedure: TRANSESOPHAGEAL ECHOCARDIOGRAM (TEE);  Surgeon: Gaye Pollack, MD;  Location: [place];  Service: Open Heart Surgery;  Laterality: N/A;     Current Outpatient Medications  Medication Sig Dispense Refill   acetaminophen (TYLENOL) 500 MG tablet Take 1-2 tablets (500-1,000 mg total) by mouth every 6 (six) hours as needed. 30 tablet 0   amLODipine (NORVASC) 5 MG tablet Take 5 mg by mouth daily.     aspirin EC 325 MG tablet Take 1 tablet (325 mg total) by mouth daily.     atorvastatin (LIPITOR) 20 MG tablet TAKE 1 TABLET(20 MG) BY MOUTH DAILY 90 tablet 3   losartan (COZAAR) 50 MG tablet Take 50 mg by mouth in the morning.     No current facility-administered medications for this visit.    Allergies:   Amiodarone, Loperamide-simethicone, and Statins    Social History:  The patient  reports that he has never smoked. He has never used smokeless tobacco. He reports current alcohol use. He reports that he does not use drugs.   Family History:  The patient's family history includes Diabetes in his paternal grandmother; Heart disease in his paternal grandfather; Pulmonary fibrosis in his father.    ROS:  Please see the history of present illness.   Otherwise, review of systems are positive for prior kne surgery, but no significant issues at this time.   All other systems are reviewed and negative.    PHYSICAL EXAM: VS:  BP (!) 134/92   Pulse 64   Ht 6\' 1"  (1.854 m)   Wt 210 lb 9.6 oz (95.5 kg)   SpO2 98%   BMI 27.79 kg/m  , BMI Body  mass index is 27.79 kg/m. GEN: Well nourished, well developed, in no acute distress HEENT: normal Neck: no JVD, carotid bruits, or masses Cardiac: RRR; 2/6 systolic murmur, no rubs, or gallops,no edema  Respiratory:  clear to auscultation bilaterally, normal work of breathing GI: soft, nontender, nondistended, + BS MS: no deformity or atrophy Skin: warm and dry, no rash Neuro:  Strength and sensation are intact Psych: euthymic mood, full affect   EKG:   The ekg ordered in December 2023 demonstrates normal sinus rhythm with PACs   Recent Labs: 01/11/2022: ALT 14 01/14/2022: Magnesium 2.4 01/16/2022: BUN 23; Creatinine, Ser 1.28; Hemoglobin 11.4; Platelets 97; Potassium 4.1; Sodium 138   Lipid Panel No results  found for: "CHOL", "TRIG", "HDL", "CHOLHDL", "VLDL", "LDLCALC", "LDLDIRECT"   Other studies Reviewed: Additional studies/ records that were reviewed today with results demonstrating: Labs reviewed.  Hospital records reviewed.  2023 operative note reviewed.  Prior TEE reviewed   ASSESSMENT AND PLAN:  S/p AVR/MVR: No signs of heart failure.  He has resumed normal activities.  He is walking 30 miles per week without any difficulty.  He did not need to do cardiac rehab.  Continue with SBE prophylaxis. HTN: Home readings are in the 120/70s range at home.  S/p Maze: LAA ligated in 2014.  No symptoms of atrial fibrillation. Hyperlipidemia:  LDL 101 in August 2023 ED: Not addressed today   Current medicines are reviewed at length with the patient today.  The patient concerns regarding his medicines were addressed.  The following changes have been made:  No change  Labs/ tests ordered today include:  No orders of the defined types were placed in this encounter.   Recommend 150 minutes/week of aerobic exercise Low fat, low carb, high fiber diet recommended  Disposition:   FU in 8 months   Signed, Larae Grooms, MD  04/22/2022 8:33 AM    [facility]  [facility] [address], [place], [place]  [address] Phone: [phone]; Fax: [phone]

## 2022-04-22 ENCOUNTER — Ambulatory Visit: Payer: BC Managed Care – PPO | Attending: Interventional Cardiology | Admitting: Interventional Cardiology

## 2022-04-22 ENCOUNTER — Encounter: Payer: Self-pay | Admitting: Interventional Cardiology

## 2022-04-22 VITALS — BP 134/92 | HR 64 | Ht 73.0 in | Wt 210.6 lb

## 2022-04-22 DIAGNOSIS — Z9889 Other specified postprocedural states: Secondary | ICD-10-CM

## 2022-04-22 DIAGNOSIS — Z952 Presence of prosthetic heart valve: Secondary | ICD-10-CM

## 2022-04-22 DIAGNOSIS — I10 Essential (primary) hypertension: Secondary | ICD-10-CM | POA: Diagnosis not present

## 2022-04-22 DIAGNOSIS — Z8679 Personal history of other diseases of the circulatory system: Secondary | ICD-10-CM | POA: Diagnosis not present

## 2022-04-22 DIAGNOSIS — E782 Mixed hyperlipidemia: Secondary | ICD-10-CM

## 2022-04-22 NOTE — Patient Instructions (Signed)
Medication Instructions:  Your physician recommends that you continue on your current medications as directed. Please refer to the Current Medication list given to you today.  *If you need a refill on your cardiac medications before your next appointment, please call your pharmacy*   Lab Work: none If you have labs (blood work) drawn today and your tests are completely normal, you will receive your results only by: MyChart Message (if you have MyChart) OR A paper copy in the mail If you have any lab test that is abnormal or we need to change your treatment, we will call you to review the results.   Testing/Procedures: none   Follow-Up: At [facility], you and your health needs are our priority.  As part of our continuing mission to provide you with exceptional heart care, we have created designated Provider Care Teams.  These Care Teams include your primary Cardiologist (physician) and Advanced Practice Providers (APPs -  Physician Assistants and Nurse Practitioners) who all work together to provide you with the care you need, when you need it.  We recommend signing up for the patient portal called "MyChart".  Sign up information is provided on this After Visit Summary.  MyChart is used to connect with patients for Virtual Visits (Telemedicine).  Patients are able to view lab/test results, encounter notes, upcoming appointments, etc.  Non-urgent messages can be sent to your provider as well.   To learn more about what you can do with MyChart, go to [email or web address].    Your next appointment:   8 month(s)  Provider:   Jayadeep Varanasi, MD     Other Instructions    

## 2022-10-18 DIAGNOSIS — Z23 Encounter for immunization: Secondary | ICD-10-CM | POA: Diagnosis not present

## 2022-10-18 DIAGNOSIS — Z5181 Encounter for therapeutic drug level monitoring: Secondary | ICD-10-CM | POA: Diagnosis not present

## 2022-10-18 DIAGNOSIS — N182 Chronic kidney disease, stage 2 (mild): Secondary | ICD-10-CM | POA: Diagnosis not present

## 2022-10-18 DIAGNOSIS — Z8547 Personal history of malignant neoplasm of testis: Secondary | ICD-10-CM | POA: Diagnosis not present

## 2022-10-18 DIAGNOSIS — Z125 Encounter for screening for malignant neoplasm of prostate: Secondary | ICD-10-CM | POA: Diagnosis not present

## 2022-10-18 DIAGNOSIS — Z0000 Encounter for general adult medical examination without abnormal findings: Secondary | ICD-10-CM | POA: Diagnosis not present

## 2022-10-18 DIAGNOSIS — E7800 Pure hypercholesterolemia, unspecified: Secondary | ICD-10-CM | POA: Diagnosis not present

## 2023-05-25 ENCOUNTER — Other Ambulatory Visit: Payer: Self-pay | Admitting: Interventional Cardiology
